# Patient Record
Sex: Female | Born: 1975 | Race: White | Hispanic: No | Marital: Married | Smoking: Current every day smoker
Health system: Southern US, Community
[De-identification: ages and names within clinical notes are randomized; demographics above are authoritative.]

## PROBLEM LIST (undated history)

## (undated) DIAGNOSIS — R569 Unspecified convulsions: Secondary | ICD-10-CM

## (undated) DIAGNOSIS — K219 Gastro-esophageal reflux disease without esophagitis: Secondary | ICD-10-CM

## (undated) DIAGNOSIS — K649 Unspecified hemorrhoids: Secondary | ICD-10-CM

## (undated) DIAGNOSIS — E039 Hypothyroidism, unspecified: Secondary | ICD-10-CM

## (undated) DIAGNOSIS — J45909 Unspecified asthma, uncomplicated: Secondary | ICD-10-CM

## (undated) HISTORY — PX: TUBAL LIGATION: SHX77

## (undated) HISTORY — DX: Gastro-esophageal reflux disease without esophagitis: K21.9

## (undated) HISTORY — DX: Hypothyroidism, unspecified: E03.9

## (undated) HISTORY — DX: Unspecified hemorrhoids: K64.9

---

## 2002-03-12 ENCOUNTER — Ambulatory Visit (HOSPITAL_COMMUNITY): Admission: RE | Admit: 2002-03-12 | Discharge: 2002-03-12 | Payer: Self-pay | Admitting: Obstetrics and Gynecology

## 2002-03-12 ENCOUNTER — Inpatient Hospital Stay (HOSPITAL_COMMUNITY): Admission: RE | Admit: 2002-03-12 | Discharge: 2002-03-17 | Payer: Self-pay | Admitting: Obstetrics and Gynecology

## 2002-03-12 ENCOUNTER — Encounter: Payer: Self-pay | Admitting: Obstetrics and Gynecology

## 2005-10-25 HISTORY — PX: VAGINAL HYSTERECTOMY: SUR661

## 2007-09-22 ENCOUNTER — Ambulatory Visit: Payer: Self-pay | Admitting: Family Medicine

## 2008-01-04 ENCOUNTER — Emergency Department: Payer: Self-pay | Admitting: Emergency Medicine

## 2008-02-12 ENCOUNTER — Ambulatory Visit: Payer: Self-pay | Admitting: Family Medicine

## 2008-03-30 ENCOUNTER — Emergency Department: Payer: Self-pay | Admitting: Emergency Medicine

## 2008-03-30 ENCOUNTER — Other Ambulatory Visit: Payer: Self-pay

## 2008-06-19 ENCOUNTER — Emergency Department: Payer: Self-pay | Admitting: Emergency Medicine

## 2008-08-04 ENCOUNTER — Emergency Department: Payer: Self-pay | Admitting: Emergency Medicine

## 2008-08-04 ENCOUNTER — Other Ambulatory Visit: Payer: Self-pay

## 2008-10-25 HISTORY — PX: CHOLESTEATOMA EXCISION: SHX1345

## 2008-12-23 ENCOUNTER — Ambulatory Visit: Payer: Self-pay | Admitting: Oncology

## 2009-01-22 ENCOUNTER — Ambulatory Visit: Payer: Self-pay | Admitting: Oncology

## 2009-01-23 ENCOUNTER — Ambulatory Visit: Payer: Self-pay | Admitting: Oncology

## 2009-02-22 ENCOUNTER — Ambulatory Visit: Payer: Self-pay | Admitting: Oncology

## 2009-03-04 ENCOUNTER — Emergency Department: Payer: Self-pay | Admitting: Emergency Medicine

## 2009-03-06 ENCOUNTER — Emergency Department: Payer: Self-pay | Admitting: Emergency Medicine

## 2009-03-24 ENCOUNTER — Ambulatory Visit: Payer: Self-pay

## 2009-04-29 ENCOUNTER — Ambulatory Visit: Payer: Self-pay | Admitting: Unknown Physician Specialty

## 2009-05-15 ENCOUNTER — Ambulatory Visit: Payer: Self-pay | Admitting: Unknown Physician Specialty

## 2009-06-16 ENCOUNTER — Ambulatory Visit: Payer: Self-pay | Admitting: Unknown Physician Specialty

## 2009-06-19 ENCOUNTER — Encounter: Admission: RE | Admit: 2009-06-19 | Discharge: 2009-06-19 | Payer: Self-pay | Admitting: Neurosurgery

## 2009-07-01 ENCOUNTER — Encounter: Admission: RE | Admit: 2009-07-01 | Discharge: 2009-07-01 | Payer: Self-pay | Admitting: Neurosurgery

## 2009-11-20 ENCOUNTER — Encounter: Payer: Self-pay | Admitting: Family Medicine

## 2009-11-25 ENCOUNTER — Encounter: Payer: Self-pay | Admitting: Family Medicine

## 2010-03-18 ENCOUNTER — Emergency Department: Payer: Self-pay | Admitting: Emergency Medicine

## 2010-09-15 ENCOUNTER — Ambulatory Visit: Payer: Self-pay | Admitting: Family Medicine

## 2010-10-19 ENCOUNTER — Emergency Department: Payer: Self-pay | Admitting: Unknown Physician Specialty

## 2010-10-23 ENCOUNTER — Encounter: Payer: Self-pay | Admitting: Family Medicine

## 2010-10-25 HISTORY — PX: OTHER SURGICAL HISTORY: SHX169

## 2010-10-25 HISTORY — PX: SPINE SURGERY: SHX786

## 2010-10-27 ENCOUNTER — Ambulatory Visit: Payer: Self-pay | Admitting: Family Medicine

## 2010-10-28 ENCOUNTER — Encounter: Payer: Self-pay | Admitting: Family Medicine

## 2010-11-27 ENCOUNTER — Ambulatory Visit: Payer: Self-pay | Admitting: Family Medicine

## 2010-12-16 ENCOUNTER — Encounter (HOSPITAL_COMMUNITY)
Admission: RE | Admit: 2010-12-16 | Discharge: 2010-12-16 | Disposition: A | Discharge status: Home / Self Care | Payer: Medicaid Other | Source: Ambulatory Visit | Attending: Neurosurgery | Admitting: Neurosurgery

## 2010-12-16 DIAGNOSIS — Z01812 Encounter for preprocedural laboratory examination: Secondary | ICD-10-CM | POA: Insufficient documentation

## 2010-12-16 LAB — URINE MICROSCOPIC-ADD ON

## 2010-12-16 LAB — BASIC METABOLIC PANEL
BUN: 8 mg/dL (ref 6–23)
CO2: 29 mEq/L (ref 19–32)
Chloride: 104 mEq/L (ref 96–112)
Creatinine, Ser: 0.64 mg/dL (ref 0.4–1.2)
Potassium: 4.9 mEq/L (ref 3.5–5.1)

## 2010-12-16 LAB — CBC
HCT: 41 % (ref 36.0–46.0)
Hemoglobin: 14.1 g/dL (ref 12.0–15.0)
MCH: 30.3 pg (ref 26.0–34.0)
MCHC: 34.4 g/dL (ref 30.0–36.0)
MCV: 88 fL (ref 78.0–100.0)
Platelets: 361 10*3/uL (ref 150–400)
RBC: 4.66 MIL/uL (ref 3.87–5.11)
RDW: 12.8 % (ref 11.5–15.5)
WBC: 5.7 10*3/uL (ref 4.0–10.5)

## 2010-12-16 LAB — URINALYSIS, ROUTINE W REFLEX MICROSCOPIC
Ketones, ur: NEGATIVE mg/dL
Nitrite: NEGATIVE
Protein, ur: NEGATIVE mg/dL
Urine Glucose, Fasting: NEGATIVE mg/dL
Urobilinogen, UA: 0.2 mg/dL (ref 0.0–1.0)

## 2010-12-16 LAB — SURGICAL PCR SCREEN
MRSA, PCR: NEGATIVE
Staphylococcus aureus: NEGATIVE

## 2010-12-16 LAB — PROTIME-INR
INR: 1.05 (ref 0.00–1.49)
Prothrombin Time: 13.9 seconds (ref 11.6–15.2)

## 2010-12-16 LAB — APTT: aPTT: 30 seconds (ref 24–37)

## 2010-12-21 ENCOUNTER — Ambulatory Visit (HOSPITAL_COMMUNITY): Payer: Medicaid Other

## 2010-12-21 ENCOUNTER — Observation Stay (HOSPITAL_COMMUNITY)
Admission: RE | Admit: 2010-12-21 | Discharge: 2010-12-21 | Disposition: A | Discharge status: Home / Self Care | Payer: Medicaid Other | Source: Ambulatory Visit | Attending: Neurosurgery | Admitting: Neurosurgery

## 2010-12-21 DIAGNOSIS — M47817 Spondylosis without myelopathy or radiculopathy, lumbosacral region: Secondary | ICD-10-CM | POA: Insufficient documentation

## 2010-12-21 DIAGNOSIS — M5126 Other intervertebral disc displacement, lumbar region: Principal | ICD-10-CM | POA: Insufficient documentation

## 2011-01-01 NOTE — Op Note (Signed)
NAMEKINSLIE, HOVE NO.:  [id number]  MEDICAL RECORD NO.:  [id number]           PATIENT TYPE:  I  LOCATION:  3534                         FACILITY:  [facility]  PHYSICIAN:  Clydene Fake, M.D.  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  PROCEDURE:  Left-sided decompressive laminectomy, decompressing at the L2 and L3 roots (two levels), diskectomy, microdissection with microscope.  SURGEON:  Clydene Fake, MD.  ASSISTANT:  None.  ANESTHESIA:  General endotracheal tube anesthesia.  ESTIMATED BLOOD LOSS:  Minimal.  BLOOD GIVEN:  None.  DRAINS:  None.  COMPLICATIONS:  None.  REASON FOR PROCEDURE:  The patient is a 35 year old woman who has been having back and leg pain with pain that radiating the left hip, upper leg, anterior thigh, with intermittent numbness and tingling.  MRIs done showing some multiple spinal degenerative changes and with broad-based disk bulge L2-3 level with a free fragment and going up cephalad.  All of this causing some canal stenosis with left-sided lateral recess stenosis with all pressure on the 2 and 3 roots on the left side of the L2-3 level.  The patient brought in for decompressive laminectomy.  PROCEDURE IN DETAIL:  The patient was brought to the operating room and general anesthesia was induced.  The patient was placed in a prone position on Wilson frame with all pressure points padded.  The patient was prepped and draped in usual sterile fashion.  Site of incision was injected with 20 mL of 1% lidocaine with epinephrine.  Needle was placed in the interspace.  X-rays were obtained showing the needle was  pointed at the L1-2 and incision was made starting just below where the needle was, incision was taken down to the fascia.  Hemostasis was obtained with Bovie cauterization.  Subperiosteal dissection was done over the 2 and 3 spinous process of the lamina.  Self-retaining retractors were placed and markers were placed at the interspaces, and another x-ray was obtained and this confirmed our positioning at L2-3.  Microscope was brought in for microdissection.  At this point, a high-speed drill was used to do decompressive hemilaminectomy going that most of the lamina of L2 at left side and top of L3, decompressing the central canal. Removed hypertrophic ligaments and then explored the epidural space.  We found the disk space at L2-3 and broad-based disk bulge and then what appeared to be a nerve root exiting a bit low and we increased our laminectomy going a little cephalad.  Above that area and dissect down the epidural space and found up that this tissue was not actually the nerve root.  We found the L2 nerve root exiting and just above there and made sure pressure was all sides and went through this thickened ligament.  We were then retract the dura medially and we found some central annular tear at L2-3 despite some disk material  coming out and then going out subligamentous towards the axilla of the two roots.  We removed this disk herniation.  We explored the two nerve roots and it was decompressed and going out to the foramen and further fragments of disk were found.  There was a small tear but it was within folds of ligaments fairly medially, so we did not open the disk space to the level.  When we were finished, we had good decompression of central canal at 2 and 3 roots.  Hemostasis with Gelfoam thrombin was then irrigated out.  We placed Depo-Medrol in the epidural space.  Retractors removed.  Fascia closed with 0 Vicryl interrupted sutures, subcutaneous tissue closed with a 0,  2-0, and 3-0 Vicryl interrupted sutures, skin closed with benzoin and Steri-Strips.  Dressing was placed.  The patient was placed in the supine position and awakened from anesthesia and transferred to recovery room in stable condition.          ______________________________ Clydene Fake, M.D.     JRH/MEDQ  D:  12/21/2010  T:  12/21/2010  Job:  [id number]  Electronically Signed by Colon Branch M.D. on 01/01/2011 05:05:10 PM

## 2011-01-11 NOTE — Discharge Summary (Signed)
  NAMEORABELLE, RYLEE NO.:  [id number]  MEDICAL RECORD NO.:  [id number]           PATIENT TYPE:  I  LOCATION:  3534                         FACILITY:  [facility]  PHYSICIAN:  Clydene Fake, M.D.  DATE OF BIRTH:  18-Feb-1976  DATE OF ADMISSION:  12/21/2010 DATE OF DISCHARGE:  12/21/2010                              DISCHARGE SUMMARY   ADMISSION DIAGNOSES:  Herniated nucleus pulposus, spondylosis, and stenosis at left L2-3 with radiculopathy.  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  PROCEDURES:  Left decompressive laminectomy decompressing the L2 and L3 roots (2 levels), diskectomy, and microdissection with microscope.  REASON FOR ADMISSION:  This is a 35 year old woman who has been having back and left leg pain radiating to the left hip, upper leg, and anterior thigh.  MRI was done showing free fragments of disk herniation at the left 2-3 level.  The patient is brought in for laminectomy and diskectomy.  HOSPITAL COURSE:  The patient was admitted to Day Surgery and underwent her procedure without complications.  Postop, she was transferred to the recovery room and then into the floor where she was observed for a few hours.  She was up, ambulating, eating, had good pain control, and was discharged home in stable condition.  DISCHARGE MEDICATIONS:  Same as prehospitalization plus pain pills and muscle relaxants use p.r.n.  Followup will be in my office in few weeks.  No strenuous activity. Keep the incision dry for 5 days.          ______________________________ Clydene Fake, M.D.     JRH/MEDQ  D:  01/01/2011  T:  01/02/2011  Job:  [id number]  Electronically Signed by Colon Branch M.D. on 01/11/2011 09:36:26 AM

## 2011-01-26 ENCOUNTER — Ambulatory Visit: Payer: Self-pay | Admitting: Neurosurgery

## 2011-02-01 ENCOUNTER — Encounter (HOSPITAL_COMMUNITY)
Admission: RE | Admit: 2011-02-01 | Discharge: 2011-02-01 | Disposition: A | Discharge status: Home / Self Care | Payer: Medicaid Other | Source: Ambulatory Visit | Attending: Neurosurgery | Admitting: Neurosurgery

## 2011-02-01 LAB — URINALYSIS, ROUTINE W REFLEX MICROSCOPIC
Protein, ur: NEGATIVE mg/dL
Specific Gravity, Urine: 1.04 — ABNORMAL HIGH (ref 1.005–1.030)
Urobilinogen, UA: 0.2 mg/dL (ref 0.0–1.0)

## 2011-02-01 LAB — BASIC METABOLIC PANEL
Calcium: 9.4 mg/dL (ref 8.4–10.5)
Creatinine, Ser: 0.61 mg/dL (ref 0.4–1.2)
GFR calc Af Amer: >60 mL/min (ref >60)
GFR calc non Af Amer: >60 mL/min (ref >60)
Sodium: 137 mEq/L (ref 135–145)

## 2011-02-01 LAB — CBC
HCT: 40.6 % (ref 36.0–46.0)
Hemoglobin: 13.7 g/dL (ref 12.0–15.0)
MCH: 29.5 pg (ref 26.0–34.0)
RBC: 4.64 MIL/uL (ref 3.87–5.11)

## 2011-02-01 LAB — PROTIME-INR
INR: 1.08 (ref 0.00–1.49)
Prothrombin Time: 14.2 seconds (ref 11.6–15.2)

## 2011-02-01 LAB — SURGICAL PCR SCREEN: Staphylococcus aureus: NEGATIVE

## 2011-02-01 LAB — PREGNANCY, URINE: Preg Test, Ur: NEGATIVE

## 2011-02-02 ENCOUNTER — Inpatient Hospital Stay (HOSPITAL_COMMUNITY)
Admission: RE | Admit: 2011-02-02 | Discharge: 2011-02-05 | DRG: 908 | Disposition: A | Discharge status: Home Health | Payer: Medicaid Other | Source: Ambulatory Visit | Attending: Neurosurgery | Admitting: Neurosurgery

## 2011-02-02 DIAGNOSIS — Y836 Removal of other organ (partial) (total) as the cause of abnormal reaction of the patient, or of later complication, without mention of misadventure at the time of the procedure: Secondary | ICD-10-CM | POA: Diagnosis present

## 2011-02-02 DIAGNOSIS — Z01812 Encounter for preprocedural laboratory examination: Secondary | ICD-10-CM

## 2011-02-02 DIAGNOSIS — F319 Bipolar disorder, unspecified: Secondary | ICD-10-CM | POA: Diagnosis present

## 2011-02-02 DIAGNOSIS — F17200 Nicotine dependence, unspecified, uncomplicated: Secondary | ICD-10-CM | POA: Diagnosis present

## 2011-02-02 DIAGNOSIS — G9601 Cranial cerebrospinal fluid leak, spontaneous: Secondary | ICD-10-CM | POA: Diagnosis present

## 2011-02-02 DIAGNOSIS — IMO0002 Reserved for concepts with insufficient information to code with codable children: Principal | ICD-10-CM | POA: Diagnosis present

## 2011-02-02 DIAGNOSIS — K219 Gastro-esophageal reflux disease without esophagitis: Secondary | ICD-10-CM | POA: Diagnosis present

## 2011-02-08 NOTE — Op Note (Signed)
NAMEMERINDA, [name] NO.:  [id number]  MEDICAL RECORD NO.:  [id number]           PATIENT TYPE:  I  LOCATION:  3005                         FACILITY:  [facility]  PHYSICIAN:  Clydene Fake, M.D.  DATE OF BIRTH:  12/19/1975  DATE OF PROCEDURE:  02/02/2011 DATE OF DISCHARGE:                              OPERATIVE REPORT   PREOPERATIVE DIAGNOSIS:  CSF fistula.  POSTOPERATIVE DIAGNOSIS:  CSF fistula.  PROCEDURE:  Explore lumbar lamina wound and repair of CSF fistula, microdissection with microscope.  SURGEON:  Clydene Fake, MD.  ASSISTANT:  Coletta Memos, MD.  ANESTHESIA:  General endotracheal tube anesthesia.  ESTIMATED BLOOD LOSS:  Minimal.  BLOOD GIVEN:  None.  DRAINS:  None.  COMPLICATIONS:  None.  REASON FOR PROCEDURE:  The patient is a 35 year old woman, who underwent a left L2-3 laminectomy and diskectomy about 6 weeks ago.  After 2 weeks __________  postop, did have a fall.  She came into the office for 3- week follow-up visit, did have a punctured fluid collection under the incision and no drainage, we decided to keep an eye on that over the next couple of weeks.  This enlarged, the patient started to have some symptoms of headaches, dizziness, and neck ache.  MRI was done, showing large fluid collection subcutaneously going down through the laminectomy defect into the thecal sac with a presumed CSF fistula.  The patient brought in for exploration of lumbar wound and repair of the CSF fistula.  PROCEDURE IN DETAIL:  The patient was brought into the operating room, and general anesthesia was induced.  The patient was placed in prone position on Wilson frame with all pressure points padded.  The patient was prepped and draped in usual sterile fashion.  Site of incision was injected with 10 mL of 1% lidocaine with epinephrine.  Incision was then made at the site of previous scar in the midline lumbar spine.  As soon as we  opened through the  skin, clear fluid was found in the subcutaneous cavity and CSF leak was entered.  There was a defect in the fascia down to the lamina from the prior surgery and we put a retractor in there.  We exposed the bone edges of the laminectomy and exposed the dura and removing a little bit of scar that was started developing. Obviously, microscope was brought in for microdissection .  At this point, it was obvious the  CSF leak was coming from the inferior aspect. The canal was re-explored that we needed to do little more of laminectomy of the L3 lamina in an effort to  find the dural rent in the medial inferior aspect of the dura, this was under the bone edge prior to doing this laminectomy and we were able to the use a 6-0 Prolene with a figure-of-eight stitch and close this dural rents.  No more spinal fluid seen that coming out.  We did Valsalva and again no further spinal fluid with good hemostasis.  We used DuraSeal to cover the dura with glue.  Retractor was removed and fascia closed with 0 Vicryl interrupted sutures.  Subcutaneous tissue closed with  2-0 and 3-0 Vicryl interrupted sutures skin closed.  Benzoin and Steri-Strips dressing was placed.  The patient was placed back in the supine position, awakened from anesthesia, and transferred to recovery room in stable condition.          ______________________________ Clydene Fake, M.D.     JRH/MEDQ  D:  02/02/2011  T:  02/03/2011  Job:  [id number]  Electronically Signed by Colon Branch M.D. on 02/08/2011 07:02:35 AM

## 2011-02-22 NOTE — Discharge Summary (Signed)
  NAMEJODIANN, OGNIBENE NO.:  [id number]  MEDICAL RECORD NO.:  [id number]           PATIENT TYPE:  I  LOCATION:  3005                         FACILITY:  [facility]  PHYSICIAN:  Clydene Fake, M.D.  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  PROCEDURE:  Explore lumbar laminectomy and repair of CSF fistula.  REASON FOR ADMISSION:  The patient is a 35 year old woman who underwent left L2-3 laminectomy and diskectomy about 6 weeks prior to admission. After 2 weeks postop, she had a fall and came into the office for followup.  She had been developing a slight fluctuance and fluid collection at the incision, but no headaches, and was fairly small. However, within another couple of weeks, this became a large fluid collection.  MRI was done.  The patient was brought in for exploration of lumbar wound and culture of the presumed CSF fistula.  HOSPITAL COURSE:  The patient was admitted on the day of procedure and underwent surgery, which was exploring the laminectomy and repairing the CSF fistula.  Postop, the patient was transferred to recovery room on the floor, and they have kept her at flat bedrest for 48 hours. Incision remained clean, dry, and intact.  She did well.  We started to letting her up and increasing her activity.  She had no postural headache.  Incision was clean, dry, and intact.  On February 05, 2011, the patient was discharged to home in stable condition.  Follow up will be in a couple of weeks in my office.  No strenuous activity.  Medications are same as prior to hospitalization.          ______________________________ Clydene Fake, M.D.     JRH/MEDQ  D:  02/15/2011  T:  02/16/2011   Job:  [id number]  Electronically Signed by Colon Branch M.D. on 02/22/2011 08:22:00 AM

## 2011-03-12 NOTE — Op Note (Signed)
[facility]  Patient:    [name], [name] Visit Number: [id number] MRN: [id number]          Service Type: OUT Location: RAD Attending Physician:  Tilda Burrow Dictated by:   Christin Bach, M.D. Admit Date:  03/12/2002 Discharge Date: 03/12/2002   CC:         Dr. Selinda Flavin, [place], [place]   Operative Report  PREOPERATIVE DIAGNOSIS:  Pregnancy [redacted] weeks gestation, cephalopelvic disproportion, desires elective permanent sterilization.  POSTOPERATIVE DIAGNOSIS:  Pregnancy [redacted] weeks gestation, cephalopelvic disproportion, desires elective permanent sterilization.  Also intraoperative hemorrhage secondary to left uterine vein laceration.  OPERATION/PROCEDURE:  1. Primary low transverse cervical cesarean section. 2. Bilateral partial salpingectomy.  SURGEON:  Christin Bach, M.D.  ASSISTANTAmie Critchley CST  ANESTHESIA:  Epidural - Dr. Despina Hidden managed by Farris Has, CRNA  COMPLICATIONS:  Extension of lower uterine segment incision into left uterine vessels with anatomic difficulties in identification due to patient girth resulting in markedly increased blood loss.  DETAILS OF PROCEDURE:  The patient was taken to the operating room and prepped and draped in the usual fashion for lower abdominal surgery.  A Pfannenstiel-type incision was performed just above the panniculus crease, being careful to avoid the superficial epigastric vessels which were visualized, but pulled laterally.  A Pfannenstiel-type incision was performed and blunt entry of the abdominal cavity performed above the bladder which was quite high.  Bladder flap was developed on the lower uterine segment, and a transverse nick made in the uterus and carefully performed to identify a small amount of amniotic fluid.  The incision was extended laterally using index finger traction and the fetal vertex could be identified.  The incision was at the level of the eyebrows of the infant which  was in a direct occiput posterior presentation.  The fetal vertex was rotated clockwise into the incision and delivered by fundal pressure.  The infant was a large infant.  The infant showed some decreased tone and responded somewhat to tactile stimulation. Approximately 50 cm of cord was sent with the infant to Dr. Latrelle Dodrill for doing her care.  There was a true knot x1 in the cord.  See Dr. Nunzio Cory notes for further details on the infant.  The cord blood samples were obtained and the placenta delivered easily in response to uterine contraction and the uterine size.  There was a generous amount of bleeding from the incision from several areas.  There was technically difficulty in identifying the anterior aspect of the transverse incision due to the voluminous fatty tissues, but we eventually were able to get [place] on the midportion of the upper and lower uterine incision edges.  The patients right side of the incision was closed with relative ease incorporating a couple of small arterial bleeders at the right edge, but not involving the uterine artery, itself on that side.  The left side was technically more challenging.  We were able to place the hand in such a way, as to place the surgeons right index finger behind the uterine vessels and to elevate, in such a way, that we could start at a corner and sew back toward the midline.  This controlled a couple of spots of arterial pulsatile bleeding.  After this layer was closed we began to close the second-layer of the two-layer closure; but we were interrupted by a recurrence of heavy bleeding from the left corner whereupon we were able to identify a large 1-cm, transverse diameter, uterine vein that was  also lacerated on the left.  This required separate double ligature in order to achieve adequate hemostasis.  Inspection, at this point, revealed adequate hemostasis.  The second layer of closure was completed.  Two additional  figure-of-eight sutures were required on the left side to complete adequate hemostasis.  At this time, we placed a lap in the bladder flap area and proceeded with two ligation.  Tubal ligation was performed which consisted of ligating around the midsegment knuckle of each fallopian tube which had been identified to its fimbriated end with excision of the specimen and identification of each tube portion for histology.  Hemostasis of the tube was good.  The bladder flap was reinspected and it was found relatively hemostatic.  The bladder flap was loosely reapproximated with 2-0 chromic.  The anterior peritoneal cavity was closed using a running 2-0 chromic.  The muscles reapproximated using 2 interrupted sutures of 2-0 chromic.  Then the fascia was closed with continuous running #0 Vicryl.  The subcutaneous tissues were irrigated, loosely reapproximated with 2 sutures of 2-0 plain and staple closure of the skin completed the procedure.  The patient tolerated the procedure well and went to the recovery room in good condition.  ADDENDUM:  Laparotomy.  Twenty-two laparotomy sponges were weighed at 2077 g minus 25 g per sponge for a total measured blood loss of 2400 cc. Epidural catheter was removed by Dr. Caroll Rancher with tip visualized as intact. Dictated by:   Christin Bach, M.D. Attending Physician:  Tilda Burrow DD:  03/13/02 TD:  03/15/02 Job: [id number]

## 2011-03-12 NOTE — Discharge Summary (Signed)
[facility]  Patient:    [name], [name] Visit Number: [id number] MRN: [id number]          Service Type: OBS Location: 4A A417 01 Attending Physician:  Tilda Burrow Dictated by:   Christin Bach, M.D. Admit Date:  03/12/2002 Discharge Date: 03/17/2002                             Discharge Summary  ADMITTING DIAGNOSES: 1. Pregnancy [redacted] weeks gestation. 2. Maternal fatigue and maternal request. 3. Suspect large for gestational age infant. 4. Maternal obesity. 5. Desire for elective sterilization.  DISCHARGE DIAGNOSES: 1. Pregnancy 40 weeks delivered. 2. Cephalopelvic disproportion secondary to large infant size. 3. Occiput posterior presentation. 4. Postoperative anemia secondary to blood loss. 5. Desire for elective sterilization.  PROCEDURE:  Mar 13, 2002 1. Lumbar epidural for labor management - Duane Lope, M.D. 2. Primary low transverse cervical cesarean section by Christin Bach, M.D. 3. Bilateral partial salpingectomy by Christin Bach, M.D  DISCHARGE MEDICATIONS: 1. Chromagen OB 1 p.o. b.i.d. x30 days. 2. Prenatal vitamins 1 p.o. every day. 3. Tylox 1 q.4h. p.r.n. pain, dispense 20. 4. HCTZ 25 mg 1 p.o. every day x2 weeks.  DISCHARGE INSTRUCTIONS:  Follow up in 4 days in our office.  HISTORY OF PRESENT ILLNESS:  This is a 36 year old gravida, 2, para 1 with a prior difficult delivery of a 7 pound 14 ounce infant requiring a 30+ hour labor.  She was admitted after complaining of multiple obstetric discomforts and desire for induction at term.  An additional consideration was at ultrasound has estimated fetal weight at greater than 9 pounds, 4353 g estimated weight when performed on Mar 12, 2002.  The patient was admitted to optimize her chance at vaginal delivery.  PAST MEDICAL HISTORY:  Hypothyroidism.  PAST SURGICAL HISTORY:  Negative.  ALLERGIES:  None.  OBSTETRICAL HISTORY:  Prolonged labor, as mentioned above with first  infant.  HOSPITAL COURSE:  The patient was admitted and underwent Foley bulb ripening of the cervix and then proceeded with Pitocin induction of labor on the morning of May 20.  She was 3 cm long, minus 3 station at 8 a.m. with presenting parts at the pelvic inlets with contractions optimized during the day, at 1:30 p.m. she was 4-5 cm.  She progressed to 9 cm, dilated completely, effaced minus 1.  Epidural was stopped to allow maximum pushing effort. Epidural has been placed by Dr. Despina Hidden.  Once she reached completely dilated she was able to push, but there was absolutely no descent of the vertex.  She developed nonreassuring variable decelerations associated with pushing effort.  C-section was recommended at 1930.  The patient was taken to the operating room where cesarean section was performed under epidural analgesia, with surgery beginning at 2015.  Surgery was complicated by extension of the transverse uterine incision into the uterine vessels on the left side with extreme difficulty controlling the blood loss due to both arterial and venous bleeding from this area.  Ultimately we gained control of this, but the prolonged labor resulted in very edematous tissues and the inferior aspect of the extension of the incision dived caudad, and, therefore, made it difficult for [place] to gain adequate exposure and control.  Postoperative hemoglobin showed the expected drop after an estimated blood loss of 2400 cc based on measured laparotomy sponges and measured suctioning. Hemoglobin on the evening of surgery was 8.8 and 26.0, just before midnight; and  gradually eased downward to 7.4 and 21.8 on the day of discharge, due to equilibration.  She tolerated the anemia quite well with mild lightheadedness upon initially standing up or sitting, but not enough to warrant transfusion. The patient was discharged for follow up in 4 days for staple removal and 4 weeks for postpartum check. Dictated by:    Christin Bach, M.D. Attending Physician:  Tilda Burrow DD:  03/17/02 TD:  03/20/02 Job: [id number]

## 2011-03-12 NOTE — Op Note (Signed)
[facility]  Patient:    [name], [name] Visit Number: [id number] MRN: [id number]          Service Type: OUT Location: RAD Attending Physician:  Tilda Burrow Dictated by:   Duane Lope, M.D. Proc. Date: 03/13/02 Admit Date:  03/12/2002 Discharge Date: 03/12/2002                             Operative Report  PROCEDURE:  Epidural placement.  INDICATIONS:  [name] is [redacted] weeks gestation with borderline fetal macrosomia, induced for that indication.  She is approximately 4 cm dilated and about 50% effaced.  She has already received one dose of Nubain.  The patient continues to have regular contractions and is requesting an epidural.  Complications of the epidural are explained to the patient and she understands.  DESCRIPTION OF PROCEDURE:  The patient is placed in the sitting position.  I used an OR indelible marker to mark her paraspinous processes, palpate her iliac crest, and find the L3-4 and L2-3 interspaces.  I decided to put the epidural at the L2-3 interspace and mark it.  The area was prepped and draped in the usual fashion.  One percent lidocaine was injected as a skin wheal.  A 17 gauge Tuohy needle was then used and a loss of resistance technique was used.  I was just to the left of the midline according to patient interaction and took three passes to find the epidural space.  I got loss of resistance at 8.5 cm and threaded the catheter without difficulty.  Sensorcaine plan 0.125% 10 cc was injected into the epidural space and the patient had no adverse symptoms.  I waited five minutes and injected another 10 cc through the catheter and taped it down at this point.  The patient got symptomatic relief at approximately 20 minutes.  I gave her another 5 cc bolus of the continuous infusion of 0.125% plain with 2 mcg/cc of fentanyl and placed that running continuously at 12 cc/hr.  The patient had a level of approximately T8 and had some  blood pressure issues and was ephedrine and ongoing fluid volume.  The patient tolerated it well. Dictated by:   Duane Lope, M.D. Attending Physician:  Tilda Burrow DD:  03/15/02 TD:  03/15/02 Job: [id number]

## 2011-04-26 ENCOUNTER — Emergency Department: Payer: Self-pay | Admitting: Emergency Medicine

## 2011-05-13 ENCOUNTER — Ambulatory Visit: Payer: Self-pay | Admitting: Family Medicine

## 2011-06-23 ENCOUNTER — Ambulatory Visit: Payer: Self-pay | Admitting: Family Medicine

## 2011-07-01 ENCOUNTER — Ambulatory Visit: Payer: Self-pay | Admitting: Gynecologic Oncology

## 2011-07-20 ENCOUNTER — Ambulatory Visit: Payer: Self-pay | Admitting: Gynecologic Oncology

## 2011-07-26 ENCOUNTER — Ambulatory Visit: Payer: Self-pay | Admitting: Gynecologic Oncology

## 2011-07-27 ENCOUNTER — Ambulatory Visit: Payer: Self-pay | Admitting: Gynecologic Oncology

## 2011-08-23 ENCOUNTER — Ambulatory Visit: Payer: Self-pay | Admitting: Gynecologic Oncology

## 2011-08-26 ENCOUNTER — Ambulatory Visit: Payer: Self-pay | Admitting: Gynecologic Oncology

## 2011-08-31 ENCOUNTER — Ambulatory Visit: Payer: Self-pay | Admitting: Gynecologic Oncology

## 2011-09-02 LAB — PATHOLOGY REPORT

## 2011-09-25 ENCOUNTER — Ambulatory Visit: Payer: Self-pay | Admitting: Gynecologic Oncology

## 2011-11-23 ENCOUNTER — Ambulatory Visit: Payer: Self-pay | Admitting: Oncology

## 2011-11-26 ENCOUNTER — Ambulatory Visit: Payer: Self-pay | Admitting: Oncology

## 2012-01-25 ENCOUNTER — Emergency Department: Payer: Self-pay | Admitting: Unknown Physician Specialty

## 2012-01-25 LAB — COMPREHENSIVE METABOLIC PANEL
Albumin: 4.1 g/dL (ref 3.4–5.0)
Alkaline Phosphatase: 74 U/L (ref 50–136)
Anion Gap: 12 (ref 7–16)
BUN: 11 mg/dL (ref 7–18)
Calcium, Total: 9.4 mg/dL (ref 8.5–10.1)
Co2: 24 mmol/L (ref 21–32)
Creatinine: 0.69 mg/dL (ref 0.60–1.30)
EGFR (Non-African Amer.): >60
Potassium: 3.6 mmol/L (ref 3.5–5.1)
SGPT (ALT): 14 U/L
Total Protein: 8.3 g/dL — ABNORMAL HIGH (ref 6.4–8.2)

## 2012-01-25 LAB — CBC
HCT: 41.3 % (ref 35.0–47.0)
HGB: 14.1 g/dL (ref 12.0–16.0)
MCH: 31.1 pg (ref 26.0–34.0)
MCHC: 34 g/dL (ref 32.0–36.0)
MCV: 92 fL (ref 80–100)
Platelet: 335 10*3/uL (ref 150–440)
RBC: 4.52 10*6/uL (ref 3.80–5.20)

## 2012-01-25 LAB — URINALYSIS, COMPLETE
Bilirubin,UR: NEGATIVE
Glucose,UR: NEGATIVE mg/dL (ref 0–75)
Ph: 5 (ref 4.5–8.0)
Protein: 100
RBC,UR: 1502 /HPF (ref 0–5)
Specific Gravity: 1.03 (ref 1.003–1.030)
Squamous Epithelial: 5

## 2012-01-25 LAB — LIPASE, BLOOD: Lipase: 66 U/L — ABNORMAL LOW (ref 73–393)

## 2012-02-15 ENCOUNTER — Ambulatory Visit: Payer: Self-pay

## 2012-03-07 ENCOUNTER — Ambulatory Visit: Payer: Self-pay | Admitting: Oncology

## 2012-03-25 ENCOUNTER — Ambulatory Visit: Payer: Self-pay | Admitting: Oncology

## 2012-04-06 IMAGING — US US PELV - US TRANSVAGINAL
1 series · 17 of 25 positions shown · non-contrast
Comparison: none

REASON FOR EXAM: ovarian cysts
COMMENTS:

[Series 1: us pelv - us transvaginal · 17 of 113 slices shown]
[im 1/113]
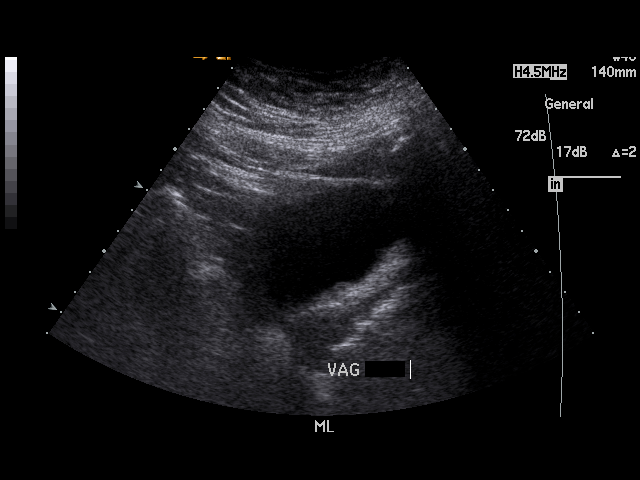
[im 10/113]
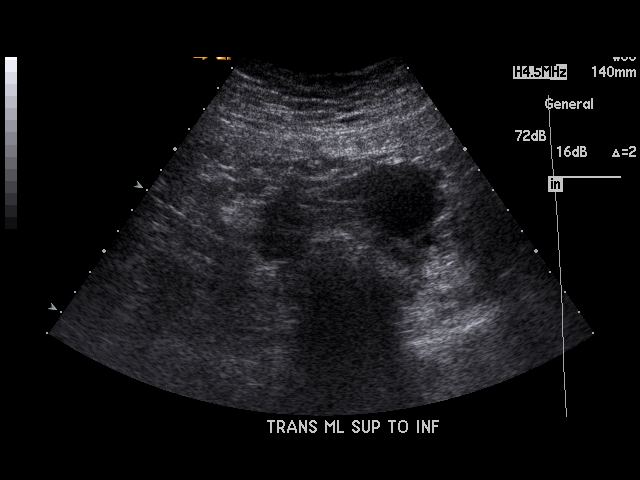
[im 15/113]
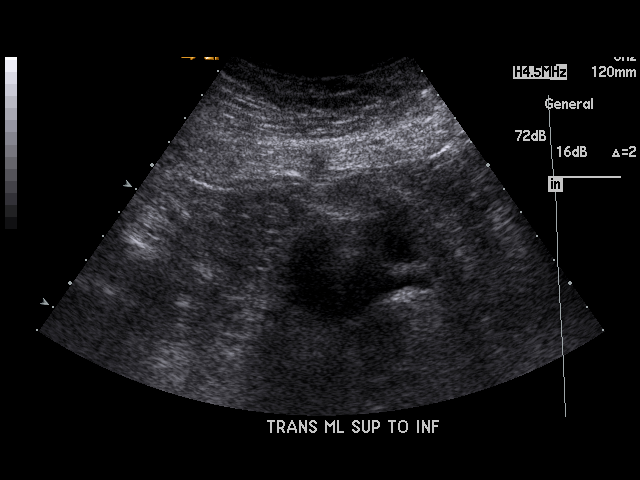
[im 24/113]
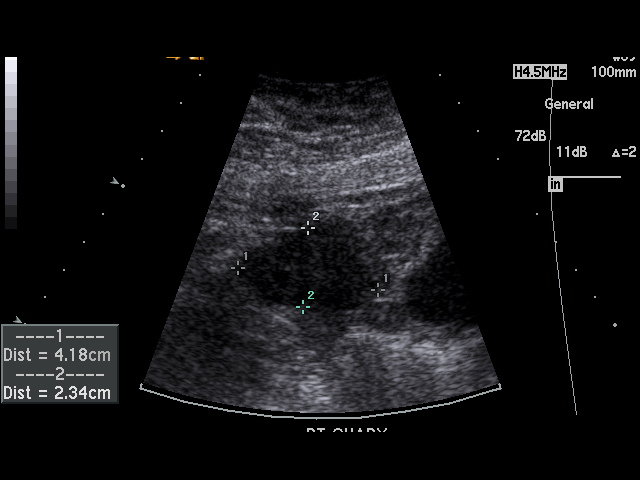
[im 29/113]
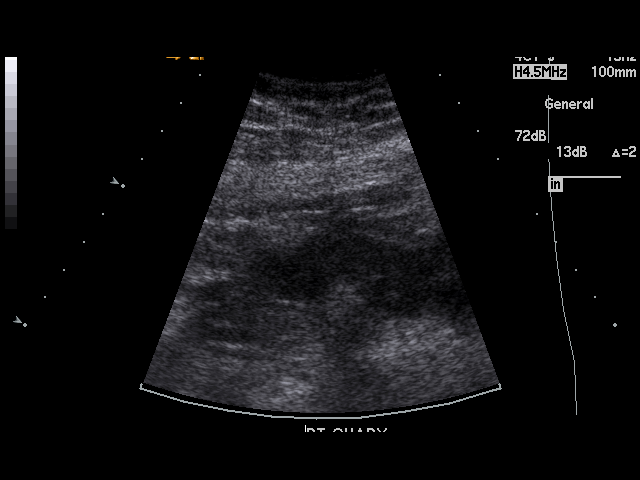
[im 38/113]
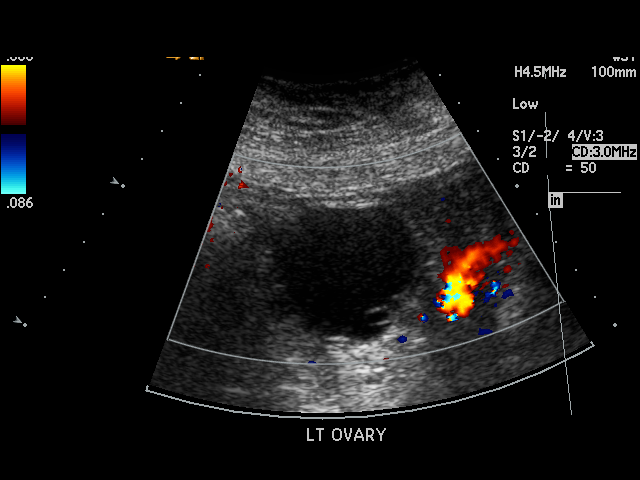
[im 43/113]
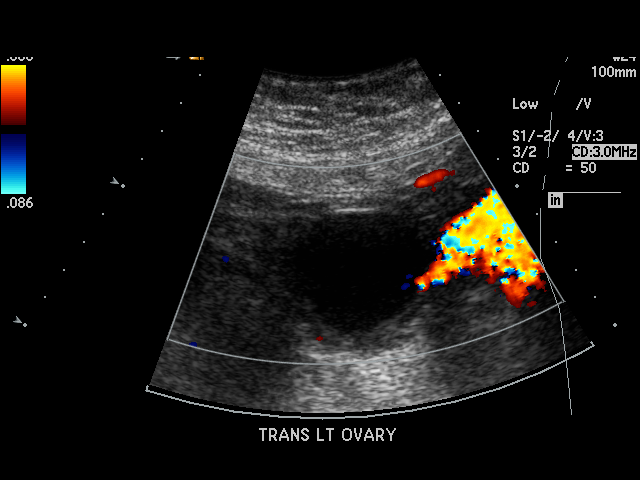
[im 52/113]
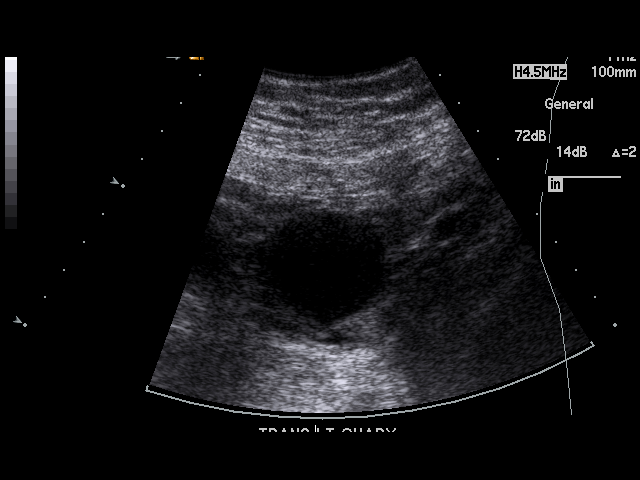
[im 57/113]
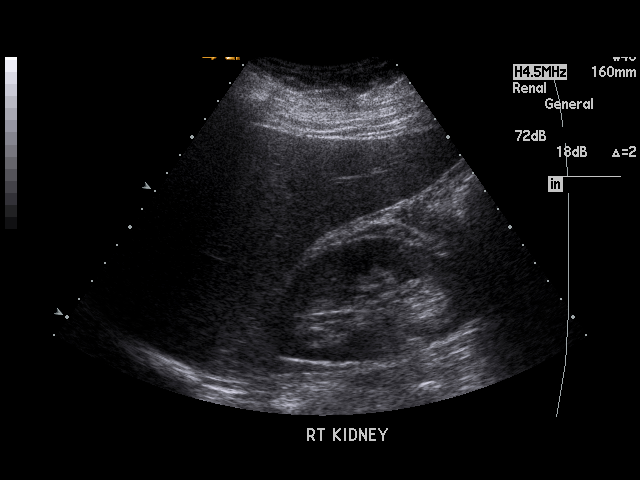
[im 61/113]
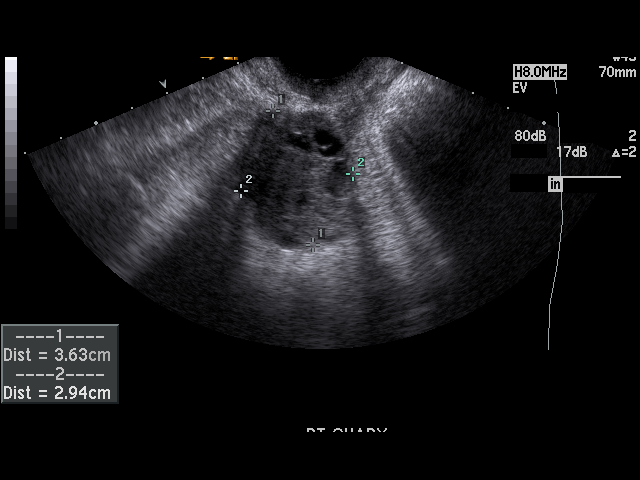
[im 71/113]
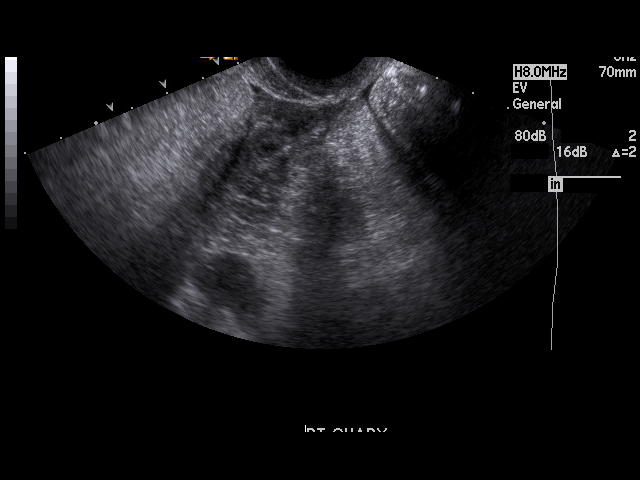
[im 75/113]
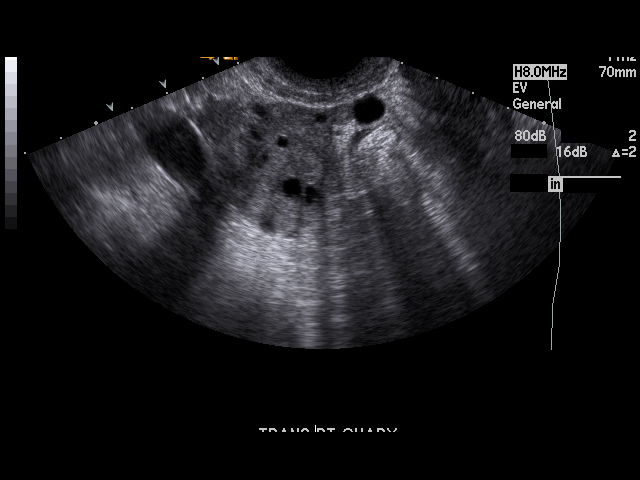
[im 85/113]
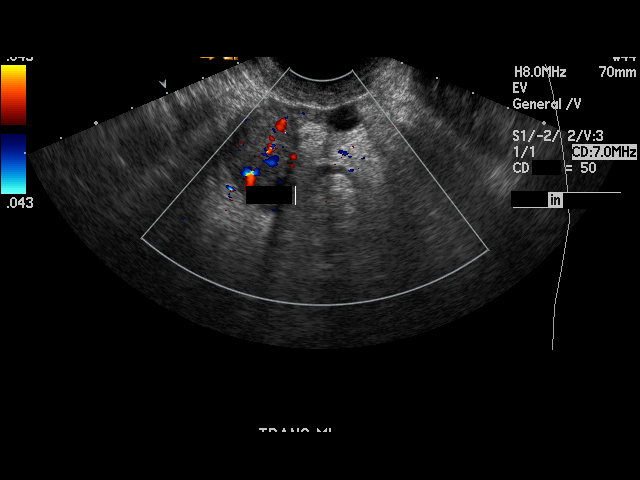
[im 89/113]
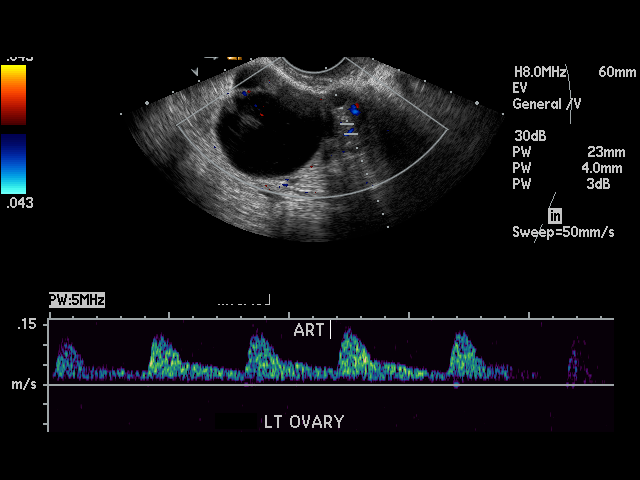
[im 99/113]
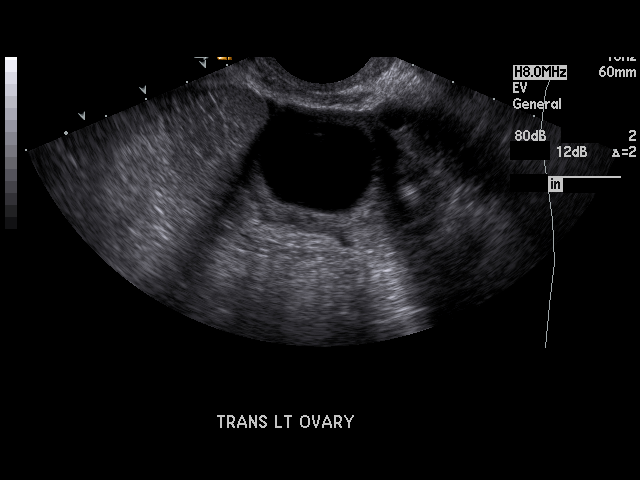
[im 103/113]
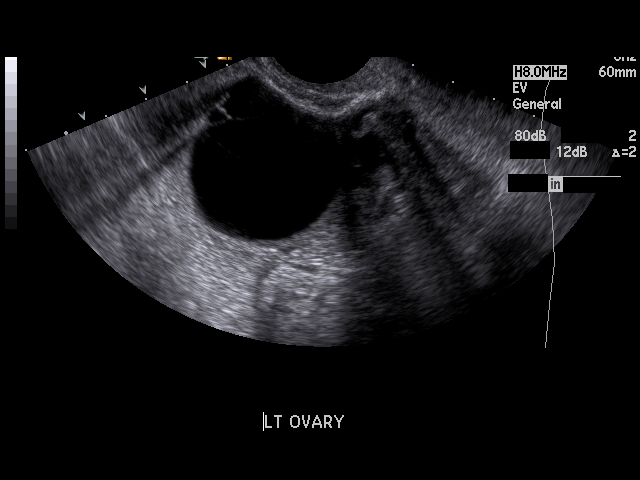
[im 113/113]
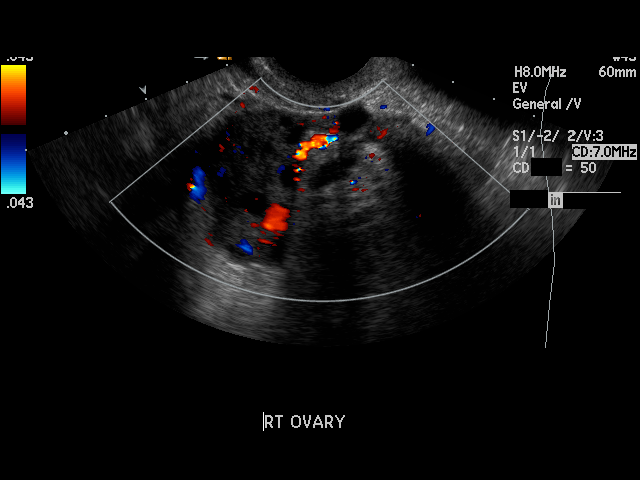

[17 of 25 positions shown; findings below may reference images not displayed]

PROCEDURE:     US  - US PELVIS EXAM W/TRANSVAGINAL  - July 27, 2011 [DATE]

RESULT:     Transabdominal and endovaginal ultrasound was performed. The
patient is status post hysterectomy. The right and left ovaries are
visualized. The right ovary measures 3.63 cm at maximum diameter and the
left ovary measures 5.27 cm at maximum diameter. There is a 4.12 cm septated
cyst in the left ovary. There is an adjacent smaller cyst measuring
approximately 1.18 cm in diameter and which has decreased in size since the
prior exam. The right ovary is normal in appearance. No ascites is seen. No
additional adnexal masses are noted. The kidneys show no hydronephrosis.
IMPRESSION: 1. There is a 4.12 cm septated cyst of the left ovary. An adjacent 1.18 cm
cyst is observed.
2. The right ovary is normal in appearance.
3. No free fluid is seen in the pelvis.
4. The patient is status post hysterectomy.

## 2012-06-08 ENCOUNTER — Ambulatory Visit: Payer: Self-pay | Admitting: Unknown Physician Specialty

## 2012-11-16 ENCOUNTER — Ambulatory Visit: Payer: Self-pay | Admitting: Pain Medicine

## 2012-11-27 ENCOUNTER — Ambulatory Visit: Payer: Self-pay | Admitting: Pain Medicine

## 2012-11-27 LAB — PROTIME-INR: Prothrombin Time: 13.7 secs (ref 11.5–14.7)

## 2012-12-16 ENCOUNTER — Emergency Department: Payer: Self-pay | Admitting: Emergency Medicine

## 2012-12-18 ENCOUNTER — Inpatient Hospital Stay: Payer: Self-pay | Admitting: Internal Medicine

## 2012-12-18 LAB — COMPREHENSIVE METABOLIC PANEL
Albumin: 3.3 g/dL — ABNORMAL LOW (ref 3.4–5.0)
Alkaline Phosphatase: 69 U/L (ref 50–136)
Calcium, Total: 8.6 mg/dL (ref 8.5–10.1)
Chloride: 110 mmol/L — ABNORMAL HIGH (ref 98–107)
Creatinine: 0.73 mg/dL (ref 0.60–1.30)
EGFR (African American): >60
EGFR (Non-African Amer.): >60
Osmolality: 277 (ref 275–301)
Potassium: 3.9 mmol/L (ref 3.5–5.1)
SGPT (ALT): 26 U/L (ref 12–78)
Sodium: 139 mmol/L (ref 136–145)
Total Protein: 7.3 g/dL (ref 6.4–8.2)

## 2012-12-18 LAB — CBC
HCT: 43.4 % (ref 35.0–47.0)
HGB: 14.4 g/dL (ref 12.0–16.0)
MCH: 30.4 pg (ref 26.0–34.0)
MCV: 91 fL (ref 80–100)
Platelet: 258 10*3/uL (ref 150–440)
RBC: 4.76 10*6/uL (ref 3.80–5.20)

## 2012-12-18 LAB — URINALYSIS, COMPLETE
Glucose,UR: NEGATIVE mg/dL (ref 0–75)
Hyaline Cast: 3
Ketone: NEGATIVE
Nitrite: NEGATIVE
Ph: 5 (ref 4.5–8.0)
Protein: NEGATIVE
Specific Gravity: 1.024 (ref 1.003–1.030)
Squamous Epithelial: 19

## 2012-12-18 LAB — DRUG SCREEN, URINE
Amphetamines, Ur Screen: NEGATIVE (ref ?–1000)
Barbiturates, Ur Screen: NEGATIVE (ref ?–200)
Benzodiazepine, Ur Scrn: POSITIVE (ref ?–200)
Cannabinoid 50 Ng, Ur ~~LOC~~: NEGATIVE (ref ?–50)
Cocaine Metabolite,Ur ~~LOC~~: NEGATIVE (ref ?–300)
MDMA (Ecstasy)Ur Screen: NEGATIVE (ref ?–500)
Opiate, Ur Screen: POSITIVE (ref ?–300)
Phencyclidine (PCP) Ur S: NEGATIVE (ref ?–25)

## 2012-12-18 LAB — MAGNESIUM: Magnesium: 1.6 mg/dL — ABNORMAL LOW

## 2012-12-18 LAB — LIPASE, BLOOD: Lipase: 77 U/L (ref 73–393)

## 2012-12-19 LAB — PHENYTOIN LEVEL, TOTAL: Dilantin: 11.1 ug/mL (ref 10.0–20.0)

## 2012-12-19 LAB — MAGNESIUM: Magnesium: 2.1 mg/dL

## 2012-12-20 LAB — CBC WITH DIFFERENTIAL/PLATELET
Basophil #: 0.1 10*3/uL (ref 0.0–0.1)
Basophil %: 1.1 %
Eosinophil #: 0.1 10*3/uL (ref 0.0–0.7)
HGB: 12.6 g/dL (ref 12.0–16.0)
Lymphocyte #: 1.7 10*3/uL (ref 1.0–3.6)
Lymphocyte %: 38 %
MCH: 30.8 pg (ref 26.0–34.0)
RBC: 4.08 10*6/uL (ref 3.80–5.20)
RDW: 13.4 % (ref 11.5–14.5)

## 2012-12-20 LAB — BASIC METABOLIC PANEL
BUN: 14 mg/dL (ref 7–18)
Chloride: 109 mmol/L — ABNORMAL HIGH (ref 98–107)
Creatinine: 0.72 mg/dL (ref 0.60–1.30)
EGFR (African American): >60
EGFR (Non-African Amer.): >60
Potassium: 4 mmol/L (ref 3.5–5.1)
Sodium: 140 mmol/L (ref 136–145)

## 2013-01-18 ENCOUNTER — Ambulatory Visit: Payer: Self-pay | Admitting: Pain Medicine

## 2013-01-31 ENCOUNTER — Ambulatory Visit: Payer: Self-pay | Admitting: Pain Medicine

## 2013-03-01 ENCOUNTER — Ambulatory Visit: Payer: Self-pay | Admitting: Pain Medicine

## 2013-04-09 ENCOUNTER — Ambulatory Visit: Payer: Self-pay | Admitting: Pain Medicine

## 2013-05-08 ENCOUNTER — Ambulatory Visit: Payer: Self-pay | Admitting: Pain Medicine

## 2013-05-11 ENCOUNTER — Other Ambulatory Visit: Payer: Self-pay | Admitting: Pain Medicine

## 2013-05-11 LAB — APTT: Activated PTT: 34.7 secs (ref 23.6–35.9)

## 2013-05-11 LAB — PROTIME-INR
INR: 1
Prothrombin Time: 13.4 secs (ref 11.5–14.7)

## 2013-05-11 LAB — PLATELET FUNCTION ASSAY: COL/EPI PLT FXN SCRN: 94 Seconds

## 2013-05-14 ENCOUNTER — Ambulatory Visit: Payer: Self-pay | Admitting: Pain Medicine

## 2013-06-12 ENCOUNTER — Ambulatory Visit: Payer: Self-pay | Admitting: Pain Medicine

## 2013-07-02 ENCOUNTER — Ambulatory Visit: Payer: Self-pay | Admitting: Pain Medicine

## 2013-07-13 DIAGNOSIS — R102 Pelvic and perineal pain unspecified side: Secondary | ICD-10-CM | POA: Insufficient documentation

## 2013-07-13 DIAGNOSIS — R350 Frequency of micturition: Secondary | ICD-10-CM | POA: Insufficient documentation

## 2013-07-13 DIAGNOSIS — N3941 Urge incontinence: Secondary | ICD-10-CM | POA: Insufficient documentation

## 2013-07-13 DIAGNOSIS — N393 Stress incontinence (female) (male): Secondary | ICD-10-CM | POA: Insufficient documentation

## 2013-07-13 DIAGNOSIS — R339 Retention of urine, unspecified: Secondary | ICD-10-CM | POA: Insufficient documentation

## 2013-07-31 ENCOUNTER — Ambulatory Visit: Payer: Self-pay | Admitting: Pain Medicine

## 2013-08-08 ENCOUNTER — Ambulatory Visit: Payer: Self-pay | Admitting: Pain Medicine

## 2013-09-06 ENCOUNTER — Ambulatory Visit: Payer: Self-pay | Admitting: Pain Medicine

## 2013-09-12 ENCOUNTER — Ambulatory Visit: Payer: Self-pay | Admitting: Pain Medicine

## 2013-10-03 ENCOUNTER — Ambulatory Visit: Payer: Self-pay | Admitting: Pain Medicine

## 2013-10-17 LAB — COMPREHENSIVE METABOLIC PANEL
Anion Gap: 4 — ABNORMAL LOW (ref 7–16)
Calcium, Total: 8.7 mg/dL (ref 8.5–10.1)
Creatinine: 0.64 mg/dL (ref 0.60–1.30)
EGFR (African American): >60
Glucose: 97 mg/dL (ref 65–99)
Osmolality: 274 (ref 275–301)
SGOT(AST): 21 U/L (ref 15–37)
Sodium: 138 mmol/L (ref 136–145)
Total Protein: 6.9 g/dL (ref 6.4–8.2)

## 2013-10-17 LAB — DRUG SCREEN, URINE
Amphetamines, Ur Screen: POSITIVE (ref ?–1000)
Barbiturates, Ur Screen: NEGATIVE (ref ?–200)
Benzodiazepine, Ur Scrn: POSITIVE (ref ?–200)
Cannabinoid 50 Ng, Ur ~~LOC~~: NEGATIVE (ref ?–50)
Cocaine Metabolite,Ur ~~LOC~~: NEGATIVE (ref ?–300)
MDMA (Ecstasy)Ur Screen: NEGATIVE (ref ?–500)
Methadone, Ur Screen: NEGATIVE (ref ?–300)
Opiate, Ur Screen: NEGATIVE (ref ?–300)
Tricyclic, Ur Screen: NEGATIVE (ref ?–1000)

## 2013-10-17 LAB — CBC
HCT: 40.8 % (ref 35.0–47.0)
MCH: 29.6 pg (ref 26.0–34.0)
MCHC: 33.9 g/dL (ref 32.0–36.0)
MCV: 87 fL (ref 80–100)
Platelet: 309 10*3/uL (ref 150–440)
RBC: 4.68 10*6/uL (ref 3.80–5.20)
RDW: 13 % (ref 11.5–14.5)

## 2013-10-17 LAB — URINALYSIS, COMPLETE
Ketone: NEGATIVE
Nitrite: NEGATIVE
Ph: 6 (ref 4.5–8.0)
Protein: NEGATIVE
RBC,UR: 9 /HPF (ref 0–5)
Specific Gravity: 1.018 (ref 1.003–1.030)
Squamous Epithelial: 44
WBC UR: 5 /HPF (ref 0–5)

## 2013-10-17 LAB — T4, FREE: Free Thyroxine: 2.85 ng/dL — ABNORMAL HIGH (ref 0.76–1.46)

## 2013-10-18 ENCOUNTER — Inpatient Hospital Stay: Payer: Self-pay | Admitting: Psychiatry

## 2013-10-18 LAB — PREGNANCY, URINE: Pregnancy Test, Urine: NEGATIVE m[IU]/mL

## 2013-10-29 ENCOUNTER — Ambulatory Visit: Payer: Self-pay | Admitting: Pain Medicine

## 2013-11-01 ENCOUNTER — Ambulatory Visit: Payer: Self-pay | Admitting: Pain Medicine

## 2013-12-06 ENCOUNTER — Ambulatory Visit: Payer: Self-pay | Admitting: Pain Medicine

## 2013-12-17 ENCOUNTER — Ambulatory Visit: Payer: Self-pay | Admitting: Pain Medicine

## 2014-01-03 ENCOUNTER — Ambulatory Visit: Payer: Self-pay | Admitting: Pain Medicine

## 2014-01-09 ENCOUNTER — Ambulatory Visit: Admit: 2014-01-09 | Disposition: A | Discharge status: Home / Self Care | Payer: Self-pay | Admitting: Pain Medicine

## 2014-01-16 ENCOUNTER — Ambulatory Visit: Payer: Self-pay | Admitting: Pain Medicine

## 2014-02-05 ENCOUNTER — Ambulatory Visit: Payer: Self-pay | Admitting: Pain Medicine

## 2014-02-13 ENCOUNTER — Ambulatory Visit: Payer: Self-pay | Admitting: Pain Medicine

## 2014-03-07 ENCOUNTER — Ambulatory Visit: Payer: Self-pay | Admitting: Pain Medicine

## 2014-03-27 ENCOUNTER — Ambulatory Visit: Payer: Self-pay | Admitting: Pain Medicine

## 2014-05-02 ENCOUNTER — Ambulatory Visit: Payer: Self-pay | Admitting: Pain Medicine

## 2014-06-04 ENCOUNTER — Ambulatory Visit: Payer: Self-pay | Admitting: Pain Medicine

## 2014-07-04 ENCOUNTER — Ambulatory Visit: Payer: Self-pay | Admitting: Pain Medicine

## 2014-07-08 ENCOUNTER — Ambulatory Visit: Payer: Self-pay | Admitting: Pain Medicine

## 2014-08-01 ENCOUNTER — Ambulatory Visit: Payer: Self-pay | Admitting: Pain Medicine

## 2014-08-21 ENCOUNTER — Ambulatory Visit: Payer: Self-pay | Admitting: Pain Medicine

## 2014-09-03 ENCOUNTER — Ambulatory Visit: Payer: Self-pay | Admitting: Pain Medicine

## 2014-09-09 ENCOUNTER — Ambulatory Visit: Payer: Self-pay | Admitting: Pain Medicine

## 2014-10-01 ENCOUNTER — Ambulatory Visit: Payer: Self-pay | Admitting: Pain Medicine

## 2014-10-09 ENCOUNTER — Ambulatory Visit: Payer: Self-pay | Admitting: Pain Medicine

## 2014-10-31 ENCOUNTER — Ambulatory Visit: Payer: Self-pay | Admitting: Pain Medicine

## 2014-12-05 ENCOUNTER — Ambulatory Visit: Payer: Self-pay | Admitting: Pain Medicine

## 2014-12-29 ENCOUNTER — Encounter (HOSPITAL_COMMUNITY): Payer: Self-pay | Admitting: *Deleted

## 2014-12-29 ENCOUNTER — Emergency Department (HOSPITAL_COMMUNITY)
Admission: EM | Admit: 2014-12-29 | Discharge: 2014-12-29 | Discharge status: Against Medical Advice | Payer: Medicaid Other | Attending: Emergency Medicine | Admitting: Emergency Medicine

## 2014-12-29 DIAGNOSIS — Z3202 Encounter for pregnancy test, result negative: Secondary | ICD-10-CM | POA: Insufficient documentation

## 2014-12-29 DIAGNOSIS — R000 Tachycardia, unspecified: Secondary | ICD-10-CM | POA: Diagnosis not present

## 2014-12-29 DIAGNOSIS — R109 Unspecified abdominal pain: Secondary | ICD-10-CM

## 2014-12-29 DIAGNOSIS — Z720 Tobacco use: Secondary | ICD-10-CM | POA: Diagnosis not present

## 2014-12-29 DIAGNOSIS — R112 Nausea with vomiting, unspecified: Secondary | ICD-10-CM | POA: Diagnosis not present

## 2014-12-29 DIAGNOSIS — R509 Fever, unspecified: Secondary | ICD-10-CM | POA: Diagnosis not present

## 2014-12-29 DIAGNOSIS — R1011 Right upper quadrant pain: Secondary | ICD-10-CM | POA: Insufficient documentation

## 2014-12-29 DIAGNOSIS — Z791 Long term (current) use of non-steroidal anti-inflammatories (NSAID): Secondary | ICD-10-CM | POA: Insufficient documentation

## 2014-12-29 DIAGNOSIS — Z87442 Personal history of urinary calculi: Secondary | ICD-10-CM | POA: Diagnosis not present

## 2014-12-29 DIAGNOSIS — Z79899 Other long term (current) drug therapy: Secondary | ICD-10-CM | POA: Insufficient documentation

## 2014-12-29 HISTORY — DX: Unspecified convulsions: R56.9

## 2014-12-29 LAB — URINE MICROSCOPIC-ADD ON

## 2014-12-29 LAB — URINALYSIS, ROUTINE W REFLEX MICROSCOPIC
Bilirubin Urine: NEGATIVE
GLUCOSE, UA: NEGATIVE mg/dL
KETONES UR: NEGATIVE mg/dL
NITRITE: POSITIVE — AB
PROTEIN: 30 mg/dL — AB
Specific Gravity, Urine: 1.015 (ref 1.005–1.030)
UROBILINOGEN UA: 0.2 mg/dL (ref 0.0–1.0)
pH: 5.5 (ref 5.0–8.0)

## 2014-12-29 LAB — CBC WITH DIFFERENTIAL/PLATELET
Basophils Absolute: 0 10*3/uL (ref 0.0–0.1)
Basophils Relative: 0 % (ref 0–1)
EOS PCT: 0 % (ref 0–5)
Eosinophils Absolute: 0 10*3/uL (ref 0.0–0.7)
HCT: 42.1 % (ref 36.0–46.0)
Hemoglobin: 14.4 g/dL (ref 12.0–15.0)
LYMPHS PCT: 8 % — AB (ref 12–46)
Lymphs Abs: 0.8 10*3/uL (ref 0.7–4.0)
MCH: 30.6 pg (ref 26.0–34.0)
MCHC: 34.2 g/dL (ref 30.0–36.0)
MCV: 89.6 fL (ref 78.0–100.0)
Monocytes Absolute: 0.9 10*3/uL (ref 0.1–1.0)
Monocytes Relative: 9 % (ref 3–12)
NEUTROS PCT: 83 % — AB (ref 43–77)
Neutro Abs: 7.8 10*3/uL — ABNORMAL HIGH (ref 1.7–7.7)
Platelets: 299 10*3/uL (ref 150–400)
RBC: 4.7 MIL/uL (ref 3.87–5.11)
RDW: 13.3 % (ref 11.5–15.5)
WBC: 9.4 10*3/uL (ref 4.0–10.5)

## 2014-12-29 LAB — COMPREHENSIVE METABOLIC PANEL
ALBUMIN: 3.6 g/dL (ref 3.5–5.2)
ALT: 14 U/L (ref 0–35)
AST: 20 U/L (ref 0–37)
Alkaline Phosphatase: 63 U/L (ref 39–117)
Anion gap: 7 (ref 5–15)
BUN: 8 mg/dL (ref 6–23)
CO2: 24 mmol/L (ref 19–32)
Calcium: 8.8 mg/dL (ref 8.4–10.5)
Chloride: 103 mmol/L (ref 96–112)
Creatinine, Ser: 0.9 mg/dL (ref 0.50–1.10)
GFR, EST NON AFRICAN AMERICAN: 81 mL/min — AB (ref >90)
Glucose, Bld: 116 mg/dL — ABNORMAL HIGH (ref 70–99)
Potassium: 3.6 mmol/L (ref 3.5–5.1)
Sodium: 134 mmol/L — ABNORMAL LOW (ref 135–145)
Total Bilirubin: 0.5 mg/dL (ref 0.3–1.2)
Total Protein: 7.4 g/dL (ref 6.0–8.3)

## 2014-12-29 LAB — PREGNANCY, URINE: PREG TEST UR: NEGATIVE

## 2014-12-29 MED ORDER — HYDROMORPHONE HCL 1 MG/ML IJ SOLN
1.0000 mg | Freq: Once | INTRAMUSCULAR | Status: DC
  Filled 2014-12-29: qty 1

## 2014-12-29 MED ORDER — ONDANSETRON 4 MG PO TBDP
8.0000 mg | ORAL_TABLET | Freq: Once | ORAL | Status: AC
  Administered 2014-12-29: 8 mg via ORAL
  Filled 2014-12-29: qty 2

## 2014-12-29 MED ORDER — FENTANYL CITRATE 0.05 MG/ML IJ SOLN
50.0000 ug | Freq: Once | INTRAMUSCULAR | Status: AC
  Administered 2014-12-29: 50 ug via INTRAVENOUS
  Filled 2014-12-29: qty 2

## 2014-12-29 MED ORDER — ACETAMINOPHEN 500 MG PO TABS
1000.0000 mg | ORAL_TABLET | Freq: Once | ORAL | Status: AC
  Administered 2014-12-29: 1000 mg via ORAL
  Filled 2014-12-29: qty 2

## 2014-12-29 MED ORDER — SODIUM CHLORIDE 0.9 % IV BOLUS (SEPSIS)
1000.0000 mL | Freq: Once | INTRAVENOUS | Status: AC
Start: 2014-12-29 — End: 2014-12-29
  Administered 2014-12-29: 1000 mL via INTRAVENOUS

## 2014-12-29 MED ORDER — ONDANSETRON HCL 4 MG/2ML IJ SOLN
4.0000 mg | Freq: Once | INTRAMUSCULAR | Status: DC
  Filled 2014-12-29: qty 2

## 2014-12-29 NOTE — ED Provider Notes (Signed)
CSN: [id number]     Arrival date & time 12/29/14  1633 History   First MD Initiated Contact with Patient 12/29/14 1659     Chief Complaint  Patient presents with  . Abdominal Pain     (Consider location/radiation/quality/duration/timing/severity/associated sxs/prior Treatment) HPI [name] is a 39 year old female with past medical history of seizures who presents the ER complaining of right-sided abdominal pain, nausea, vomiting, fever. Patient reports last night she began running a fever, and throughout the night had several episodes of nonbilious, nonbloody emesis. Patient reports acute onset of abdominal pain this morning. Patient references her right flank to locate her pain, and states the pain radiates into her right upper abdomen. Patient states she has had kidney stones in the past, and her pain feels somewhat similar. Patient states she has been unable to keep food or fluids down since she has been vomiting. Patient denies any headache, dizziness, blurred vision, chest pain, shortness of breath, dysuria, diarrhea.  Past Medical History  Diagnosis Date  . Seizures    History reviewed. No pertinent past surgical history. No family history on file. History  Substance Use Topics  . Smoking status: Current Every Day Smoker  . Smokeless tobacco: Not on file  . Alcohol Use: Yes   OB History    No data available     Review of Systems  Constitutional: Positive for fever.  HENT: Negative for trouble swallowing.   Eyes: Negative for visual disturbance.  Respiratory: Negative for shortness of breath.   Cardiovascular: Negative for chest pain.  Gastrointestinal: Positive for nausea, vomiting and abdominal pain.  Genitourinary: Negative for dysuria.  Musculoskeletal: Negative for neck pain.  Skin: Negative for rash.  Neurological: Negative for dizziness, weakness and numbness.  Psychiatric/Behavioral: Negative.       Allergies  Darvon and Gabapentin  Home Medications    Prior to Admission medications   Medication Sig Start Date End Date Taking? Authorizing Provider  alprazolam Prudy Feeler) 2 MG tablet Take 2 mg by mouth 4 (four) times daily as needed for sleep.   Yes Historical Provider, MD  amphetamine-dextroamphetamine (ADDERALL) 20 MG tablet Take 35 mg by mouth 2 (two) times daily.   Yes Historical Provider, MD  cetirizine (ZYRTEC) 10 MG tablet Take 10 mg by mouth daily.   Yes Historical Provider, MD  diclofenac (CATAFLAM) 50 MG tablet Take 50 mg by mouth 2 (two) times daily.   Yes Historical Provider, MD  fesoterodine (TOVIAZ) 8 MG TB24 tablet Take 8 mg by mouth daily.   Yes Historical Provider, MD  fluticasone (FLOVENT HFA) 220 MCG/ACT inhaler Inhale 2 puffs into the lungs every 4 (four) hours as needed.   Yes Historical Provider, MD  levothyroxine (SYNTHROID, LEVOTHROID) 175 MCG tablet Take 175 mcg by mouth daily before breakfast.   Yes Historical Provider, MD  omeprazole (PRILOSEC) 40 MG capsule Take 40 mg by mouth daily.   Yes Historical Provider, MD  oxyCODONE-acetaminophen (PERCOCET) 10-325 MG per tablet Take 1 tablet by mouth every 4 (four) hours as needed for pain.   Yes Historical Provider, MD   BP 100/53 mmHg  Pulse 101  Temp(Src) 101.6 F (38.7 C) (Oral)  Resp 16  SpO2 96% Physical Exam  Constitutional: She is oriented to person, place, and time. She appears well-developed and well-nourished. No distress.  HENT:  Head: Normocephalic and atraumatic.  Mouth/Throat: Oropharynx is clear and moist. No oropharyngeal exudate.  Eyes: Right eye exhibits no discharge. Left eye exhibits no discharge. No scleral icterus.  Neck: Normal range of motion.  Cardiovascular: Regular rhythm, S1 normal, S2 normal and normal heart sounds.  Tachycardia present.   No murmur heard. Tachycardic--120 on exam  Pulmonary/Chest: Effort normal and breath sounds normal. No respiratory distress.  Abdominal: Soft. Normal appearance and bowel sounds are normal. There is  tenderness in the right upper quadrant. There is CVA tenderness. There is no rigidity, no guarding, no tenderness at McBurney's point and negative Murphy's sign.  Musculoskeletal: Normal range of motion. She exhibits no edema or tenderness.  Neurological: She is alert and oriented to person, place, and time. No cranial nerve deficit. Coordination normal.  Skin: Skin is warm and dry. No rash noted. She is not diaphoretic.  Psychiatric: She has a normal mood and affect.  Nursing note and vitals reviewed.   ED Course  Procedures (including critical care time) Labs Review Labs Reviewed  CBC WITH DIFFERENTIAL/PLATELET - Abnormal; Notable for the following:    Neutrophils Relative % 83 (*)    Neutro Abs 7.8 (*)    Lymphocytes Relative 8 (*)    All other components within normal limits  COMPREHENSIVE METABOLIC PANEL - Abnormal; Notable for the following:    Sodium 134 (*)    Glucose, Bld 116 (*)    GFR calc non Af Amer 81 (*)    All other components within normal limits  URINALYSIS, ROUTINE W REFLEX MICROSCOPIC - Abnormal; Notable for the following:    APPearance CLOUDY (*)    Hgb urine dipstick MODERATE (*)    Protein, ur 30 (*)    Nitrite POSITIVE (*)    Leukocytes, UA SMALL (*)    All other components within normal limits  URINE MICROSCOPIC-ADD ON - Abnormal; Notable for the following:    Bacteria, UA MANY (*)    All other components within normal limits  PREGNANCY, URINE    Imaging Review No results found.   EKG Interpretation None      MDM   Final diagnoses:  Right flank pain    Patient eloped after my assessment and after labs were obtained. We'll try to follow up with patient through the flow manager to notify her of a urinary tract infection. With a UTI and fever, there could be alternate pathology that was not able to be fully addressed tonight with her leaving prior to imaging studies and further workup.  Signed,  Ladona MowJoe Malya Cirillo, PA-C 3:14 AM     Monte FantasiaJoseph W  Miaisabella Bacorn, PA-C 12/30/14 [id number]  Tilden FossaElizabeth Rees, MD 01/01/15 0700

## 2014-12-29 NOTE — ED Notes (Signed)
The pt has had a temp nv since this am with abd pain  No diarrhea.  A family member was just ill with the same symptoms this past week.  lmp none

## 2014-12-29 NOTE — ED Notes (Signed)
Pt called out for discharge paper work; was told not up for discharge as of yet waiting for CT scan per PA; Pt seen with family member 5 minutes later leaving Ed; PA notified

## 2014-12-29 NOTE — ED Notes (Signed)
Pt. C/o headahce.

## 2015-01-02 ENCOUNTER — Ambulatory Visit: Payer: Self-pay | Admitting: Pain Medicine

## 2015-01-15 ENCOUNTER — Ambulatory Visit: Payer: Self-pay | Admitting: Pain Medicine

## 2015-02-04 ENCOUNTER — Ambulatory Visit
Admit: 2015-02-04 | Disposition: A | Discharge status: Home / Self Care | Payer: Medicaid Other | Attending: Pain Medicine | Admitting: Pain Medicine

## 2015-02-12 ENCOUNTER — Ambulatory Visit
Admit: 2015-02-12 | Disposition: A | Discharge status: Home / Self Care | Payer: Self-pay | Attending: Pain Medicine | Admitting: Pain Medicine

## 2015-02-14 NOTE — Discharge Summary (Signed)
PATIENT NAME:  Cathy MurdochWATSON, Eliani A MR#:  [id number] DATE OF BIRTH:  1976-02-07  DATE OF ADMISSION:  12/18/2012 DATE OF DISCHARGE:  12/21/2012  PRIMARY CARE PHYSICIAN: Maralyn SagoSarah "Sallie" Allena KatzPatel, MD   DISCHARGE DIAGNOSES: 1. Pseudoseizure.  2. Anxiety and stress.  3. Encephalopathy.  4. Hypothyroidism.  5. Chronic pain.  6. Otitis media.  7. Gastroesophageal reflux disease.   MEDICATIONS ON DISCHARGE:  Prilosec 40 mg daily, ibuprofen 600 mg every 6 hours p.r.n. pain, Zyrtec 10 mg daily, Xanax 2 mg 3 times a day, Robaxin 500 mg every 8 hours as needed, Percocet 10/325, 1 tablet 1 to 2 times a day as needed for pain, Fanapt 2 mg, 2 tablets at bedtime, Restoril 15 mg at bedtime, Augmentin 875 mg twice a day for 9 days, levothyroxine 200 mcg daily, Maxalt 10 mg oral disintegrating tablet as needed for migraine.   DIET: Regular diet, regular consistency.   ACTIVITY: Activity as tolerated.   FOLLOWUP:  1. Dr. Janeece RiggersSu, Psychiatry, I think she has an appointment shortly.   2. Dr. Hillery AldoSarah Patel in 1 to 2 weeks.   REASON FOR ADMISSION: The patient was admitted 12/18/2012, discharged 12/21/2012. The patient came in with unresponsiveness and seizure.   HISTORY OF PRESENT ILLNESS: A 39 year old female with history of cholesteatoma of the right ear, chronic back pain, anemia, bipolar disorder. She presents with seizure-like activity and unresponsiveness and repeated episodes. [facility] and signed out AGAINST MEDICAL ADVICE. She was admitted with seizure activity, hypomagnesemia, anxiety, depression, chronic back pain, tobacco abuse and hypothyroidism.  She was admitted to the medical floor. An EEG was ordered, an MRI of the brain, Neurology consultation with Dr. Sherryll BurgerShah, Psychiatry consultation by Dr. Toni Amendlapacs.    HOSPITAL LABORATORY AND RADIOLOGICAL DATA:  White blood cell count of 6.4, H and H 14.4 and 43.4, platelet count of 258. Dilantin 1.5. Magnesium 1.6, lipase 77, glucose 99, BUN 12, creatinine  0.73, sodium 139, potassium 3.9, chloride 110, CO2 22, calcium 8.6. Liver function tests: AST slightly elevated at 55. Urinalysis: Trace leukocyte esterase. Urine toxicology: Positive for opiates and benzodiazepine.  Chest x-ray: No acute cardiopulmonary disease. CT scan of the orbits, sella without contrast showed further improvement of the appearance of the middle ear structures on the right. MRI of the brain showed mild white matter changes noted consistent with chronic ischemia. No acute abnormality. EEG in awake and sleep states within normal limits. Decreased responsiveness with normal background raises the possibility of psychogenic nonepileptic spells.   HOSPITAL COURSE PER PROBLEM LIST:  1. The patient was diagnosed with pseudoseizures, testing here has been negative. Dr. Sherryll BurgerShah saw in consultation. I did mention to the patient that the [facility] units if pseudoseizures become persistent for her. I believe this is all secondary to anxiety and stress.  2. For the patient's anxiety stress and stress, the patient was seen in consultation by Dr. Toni Amendlapacs, who believed that the patient did not need any further monitoring down on the psychiatric unit. He recommended going down on the Xanax to a p.r.n. basis. I do think the patient may be overmedicated and is very concerned about her medications. I recommend following up with Dr. Janeece RiggersSu as outpatient.  3. Encephalopathy:  I think this is all secondary to the pseudoseizure.  4. Hypothyroidism: The patient is on Synthroid.  5. Chronic pain: She does take Percocet. She hides this from her husband, who is a substance abuser and  just got out of rehab. He does not know that she takes pain medication.  6. Otitis media: The patient was started on Augmentin.  7. Gastroesophageal reflux disease: She is on Prilosec.   Overall prognosis is poor with her psychiatric issues. I did advise no driving with the  pseudoseizure episodes. Close clinical followup with Dr. Janeece Riggers is needed for mood stabilization. Followup with Dr. Hillery Aldo for other medical issues.  In  speaking with Dr. Sherryll Burger, Neurology, the cholesteatoma does not increase the risk of seizure. The patient's seizure medications were stopped since this is pseudoseizure.   TIME SPENT ON DISCHARGE: 35 minutes.   ____________________________ Herschell Dimes. Renae Gloss, MD rjw:cb D: 12/21/2012 15:23:40 ET T: 12/21/2012 17:47:56 ET JOB#: [id number]  cc: Herschell Dimes. Renae Gloss, MD, <Dictator> Sarah "Sallie" Allena Katz, MD Lynett Fish, MD, Psychiatry Salley Scarlet MD ELECTRONICALLY SIGNED 12/26/2012 13:15
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
## 2015-02-14 NOTE — Consult Note (Signed)
Brief Consult Note: Diagnosis: bipolar disorder type 2.   Patient was seen by consultant.   Consult note dictated.   Recommend further assessment or treatment.   Orders entered.   Comments: Psychiatry: Patient seen. Hx of bipolar with only partial benifit from meds. High level of life stress. Can't rule in "pseudo seizures" but can say it seems likely that emotional difficulty is adding to her problem. I agree with restarting all her usual meds but I'm changing the xanax to prn and increasing the synthroid to what she now says is the correct dose. Also going to try a qhs Maxalt 10mg  tonight for her headache. Will follow.  Electronic Signatures: Audery Amellapacs, Jolyssa Oplinger T (MD)  (Signed 26-Feb-14 18:00)  Authored: Brief Consult Note   Last Updated: 26-Feb-14 18:00 by Audery Amellapacs, Lezli Danek T (MD)
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
## 2015-02-14 NOTE — H&P (Signed)
PATIENT NAME:  [name], [name] MR#:  [id number] DATE OF BIRTH:  26-Aug-1976  DATE OF ADMISSION:  12/18/2012  PRIMARY CARE PHYSICIAN: Hillery AldoSarah Patel, MD REFERRING PHYSICIAN: Malachy Moanevainder Goli, MD   CHIEF COMPLAINT: Unresponsiveness yesterday and seizure 3 days ago.   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 The patient is alert, awake, oriented, in no acute distress. According to the patient and her husband, the patient was unresponsive for about 5 minutes yesterday and recovered without any seizure or incontinence. They called her ENT physician, who advised the patient to come to the ED for further evaluation. Also, the patient's husband mentioned that the patient had Foley seizure 4 times 3 days ago, was [facility], but the patient's left AMA.  Per her husband, the patient had bilateral arm shaking with bilateral lower extremity contracture. In addition, the patient lost consciousness but no incontinence.  The patient cannot remember what happened to her, but she complains of headache, dizziness, weakness now. She denies any incontinence. According to Dr. Enedina FinnerGoli, the patient had 1 episode of unresponsiveness during Foley CT test just now. According to the patient's husband, the patient had 1 episode of seizure 4 years ago.   PAST MEDICAL HISTORY: Bipolar disorder, depression, brain tumor status post surgery, carpal tunnel syndrome, chronic back pain on Percocet, anemia, arthritis, hypothyroidism, brain tumor surgery, back surgery, hysterectomy, C-section and tubal ligation.   SOCIAL HISTORY: The patient smoked 2 packs Foley day before but decreased to 1/2 pack to 1 pack Foley day for many years. Denies any alcohol drinking or illicit drugs.   FAMILY HISTORY: No seizure, cancer,  hypertension, diabetes, heart attack or stroke.   HOME MEDICATIONS: Xanax 2 mg p.o. t.i.d., Robaxin 500 mg p.o. tablet every 8 hours p.r.n., Restoril 15 mg p.o. once daily at bedtime, Prilosec 40 mg p.o. once daily, Percocet 10/325 mg p.o. 1 tablet 1 to 2 times Foley day p.r.n., levothyroxine 150 mcg p.o. daily, Ibu-600 mg p.o. tablets, 1 tablet every 6 hours p.r.n., Fanapt  2 mg p.o. 2 tablets once Foley day at bedtime, Zyrtec 10 mg p.o. once Foley day in the evening.   REVIEW OF SYSTEMS:  CONSTITUTIONAL: The patient complains of Foley headache, dizziness, weakness but no fever or chills.   EYES: No double vision, blurred vision.  ENT: No epistaxis. No postnasal drip, slurred speech, or dysphagia.  CARDIOVASCULAR: No chest pain, palpitation, orthopnea or nocturnal dyspnea. No leg edema.  PULMONARY: No cough, sputum, shortness of breath or hematemesis.  GASTROINTESTINAL: No abdominal pain, nausea, vomiting or diarrhea. No melena or bloody stool.  GENITOURINARY: No dysuria, hematuria or incontinence.  SKIN: No rash or jaundice.  NEUROLOGY: Positive for seizure, loss of consciousness, but no incontinence, no facial droop.  SKIN: No rash or jaundice.   PHYSICAL EXAMINATION: VITAL SIGNS: Temperature 98.1, blood pressure 120/70, pulse 78, respirations 18, oxygen saturation 96% on room air.  GENERAL: The patient is alert, awake, oriented, looks confused but in no acute distress.  HEENT: Pupils are round, equal, reactive to light, accommodation. Moist oral mucosa. Clear oropharynx. No discharge from ear or nose. Ears: Mild tenderness on the right ear but no erythema or discharge.  NECK: Supple. No JVD or carotid bruits. No lymphadenopathy. No thyromegaly.  CARDIOVASCULAR: S1, S2, regular  rate and rhythm. No murmurs or gallops.  PULMONARY: Bilateral air entry. No wheezing or rales. No use of accessory muscles to breathe.  ABDOMEN: Obese, soft, bowel sounds present. No distention or tenderness. No organomegaly.    EXTREMITIES: No edema, clubbing, or cyanosis. No calf tenderness. Bilateral pedal pulses present.  SKIN: No rash or jaundice.  NEUROLOGICAL: Foley and O x 3. No focal deficit. Power 5 out of 5. Sensation intact.   LABORATORY AND RADIOLOGICAL DATA:  CAT scan of head showed mild improvement in appearance of the middle ear structure on the right side. The tympanic membranes remains thickened.  No abnormality is demonstrated in the left temporal bone. Chest x-ray: No abnormality.  Urine toxicity showed benzodiazepine positive and opiate positive. Urinalysis is negative.  Glucose 99, BUN of 12, creatinine 0.73, potassium 3.9, chloride 110, lipase 77, magnesium 1.6. CBC normal.   IMPRESSION: 1. Questionable seizure.  2. Hypomagnesemia.  3. Anxiety.  4. Bipolar disorder.  5. Chronic back pain.  6. Hypothyroidism.  7. Tobacco abuse.   PLAN OF TREATMENT:  1. The patient will be admitted to the medical floor. We will start seizure, fall and aspiration precautions. We will get an EEG and start Dilantin and Keppra.  We will give Ativan p.r.n., follow up with Dr. Sherryll Burger. Dr. Enedina Finner already talked with Dr. Sherryll Burger, neurologist. He will see the patient.  2. We will hold Fanapt, which may cause lethargy and EPS, and also we will hold Robaxin which has Foley side effect of seizure.  3. Smoking cessation was counseled for 5 minutes. We will give nicotine inhaler. The patient said that the nicotine patch does not work for her.   I discussed the patient's condition and the plan of treatment with the patient and her husband.   CODE STATUS:  The patient wants FULL CODE.    TIME SPENT: About 72 minutes.  ____________________________ Shaune Pollack, MD qc:cb D: 12/18/2012 16:12:44 ET T: 12/18/2012 16:56:35 ET JOB#: [id number]  cc: Shaune Pollack, MD, <Dictator> Shaune Pollack MD ELECTRONICALLY SIGNED 12/20/2012 16:38
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
## 2015-03-06 ENCOUNTER — Encounter: Payer: Self-pay | Admitting: Pain Medicine

## 2015-03-06 ENCOUNTER — Encounter (INDEPENDENT_AMBULATORY_CARE_PROVIDER_SITE_OTHER): Payer: Self-pay

## 2015-03-06 ENCOUNTER — Ambulatory Visit: Payer: Medicaid Other | Attending: Pain Medicine | Admitting: Pain Medicine

## 2015-03-06 VITALS — BP 123/60 | HR 108 | Temp 97.5°F | Resp 16 | Ht 62.0 in | Wt 183.0 lb

## 2015-03-06 DIAGNOSIS — M961 Postlaminectomy syndrome, not elsewhere classified: Secondary | ICD-10-CM | POA: Diagnosis not present

## 2015-03-06 DIAGNOSIS — M5481 Occipital neuralgia: Secondary | ICD-10-CM | POA: Insufficient documentation

## 2015-03-06 DIAGNOSIS — M51369 Other intervertebral disc degeneration, lumbar region without mention of lumbar back pain or lower extremity pain: Secondary | ICD-10-CM | POA: Insufficient documentation

## 2015-03-06 DIAGNOSIS — G43909 Migraine, unspecified, not intractable, without status migrainosus: Secondary | ICD-10-CM | POA: Insufficient documentation

## 2015-03-06 DIAGNOSIS — M47818 Spondylosis without myelopathy or radiculopathy, sacral and sacrococcygeal region: Secondary | ICD-10-CM

## 2015-03-06 DIAGNOSIS — M542 Cervicalgia: Secondary | ICD-10-CM | POA: Diagnosis present

## 2015-03-06 DIAGNOSIS — M5030 Other cervical disc degeneration, unspecified cervical region: Secondary | ICD-10-CM

## 2015-03-06 DIAGNOSIS — M4806 Spinal stenosis, lumbar region: Secondary | ICD-10-CM | POA: Diagnosis not present

## 2015-03-06 DIAGNOSIS — M5136 Other intervertebral disc degeneration, lumbar region: Secondary | ICD-10-CM | POA: Insufficient documentation

## 2015-03-06 DIAGNOSIS — M549 Dorsalgia, unspecified: Secondary | ICD-10-CM | POA: Diagnosis present

## 2015-03-06 DIAGNOSIS — M461 Sacroiliitis, not elsewhere classified: Secondary | ICD-10-CM

## 2015-03-06 MED ORDER — OXYCODONE HCL 10 MG PO TABS: ORAL_TABLET | ORAL | Status: DC

## 2015-03-06 MED ORDER — DICLOFENAC POTASSIUM 50 MG PO TABS: ORAL_TABLET | ORAL | Status: DC

## 2015-03-06 NOTE — Progress Notes (Signed)
Patient-year-old female returns to pain management Center for further evaluation and treatment of pain involving the neck and entire back upper and lower posterior regions states that her pain is aggravated by standing and walking We discussed patient's condition and will consider patient for interventional treatment at time return appointment. Patient is understanding and in status treatment plan   Physical examination  Tenderness over the paraspinal musculature region cervical region cervical facet region degree Tenderness of the splenius capitis and the subcutaneous talus regions of moderate degree. Tinnitus of the lumbar paraspinal musculature region of severe degree with severe tenderness of the lumbar facet region with rotation reproducing severe discomfort raising limited to 20 without an increase of pain with dorsiflexion noted tenderness of the piriformis muscles as well as the PSIS region. Tenderness to palpation of costovertebral tenderness noted  Assessment  Degenerative disc disease lumbar spine Status post lumbar hemilaminectomy with normal stenosis and facet arthrosis   Facet syndrome lumbar region  Degenerative disc disease of the cervical spine  Sacroiliac joint dysfunction  Migraine headache  Greater occipital neuralgia   Plan  Continue present medications  Consider lumbar epidural steroid injection a return appointment  Follow-up [name] and [name]  Psych follow-up evaluation as well as surgical in neurological reevaluation  Consider radiofrequency and intraspinal implantations pending follow-up evaluation  Patient to call Pain Management Center should any change in condition prior to scheduled return appointment

## 2015-03-06 NOTE — Progress Notes (Signed)
   Subjective:    Patient ID: Cathy MurdochPatricia A Foley, female    DOB: 09/17/1976, 39 y.o.   MRN: [id number]  HPI    Review of Systems     Objective:   Physical Exam        Assessment & Plan:

## 2015-03-06 NOTE — Patient Instructions (Addendum)
Continue present medications. Oxycodone Cataflam Robaxin  Lumbar epidural steroid injection 03/19/2015  F/U PCP for evaliation of  BP and general medical  condition.  F/U surgical evaluation.  F/U nrurological evaluation.  May consider radiofrequency rhizolysis or intraspinal procedures pending response to present treatment and F/U evaluation.  Patient to call [facility] should patient have concerns prior to scheduled return appointment. GENERAL RISKS AND COMPLICATIONS  What are the risk, side effects and possible complications? Generally speaking, most procedures are safe.  However, with any procedure there are risks, side effects, and the possibility of complications.  The risks and complications are dependent upon the sites that are lesioned, or the type of nerve block to be performed.  The closer the procedure is to the spine, the more serious the risks are.  Great care is taken when placing the radio frequency needles, block needles or lesioning probes, but sometimes complications can occur. 1. Infection: Any time there is an injection through the skin, there is a risk of infection.  This is why sterile conditions are used for these blocks.  There are four possible types of infection. 1. Localized skin infection. 2. Central Nervous System Infection-This can be in the form of Meningitis, which can be deadly. 3. Epidural Infections-This can be in the form of an epidural abscess, which can cause pressure inside of the spine, causing compression of the spinal cord with subsequent paralysis. This would require an emergency surgery to decompress, and there are no guarantees that the patient would recover from the paralysis. 4. Discitis-This is an infection of the intervertebral discs.  It occurs in about 1% of discography procedures.  It is difficult to treat and it may lead to surgery.        2. Pain: the needles have to go through skin and soft tissues, will cause soreness.  3. Damage to internal structures:  The nerves to be lesioned may be near blood vessels or    other nerves which can be potentially damaged.       4. Bleeding: Bleeding is more common if the patient is taking blood thinners such as  aspirin, Coumadin, Ticiid, Plavix, etc., or if he/she have some genetic predisposition  such as hemophilia. Bleeding into the spinal canal can cause compression of the spinal  cord with subsequent paralysis.  This would require an emergency surgery to  decompress and there are no guarantees that the patient would recover from the  paralysis.       5. Pneumothorax:  Puncturing of a lung is a possibility, every time a needle is introduced in  the area of the chest or upper back.  Pneumothorax refers to free air around the  collapsed lung(s), inside of the thoracic cavity (chest cavity).  Another two possible  complications related to a similar event would include: Hemothorax and Chylothorax.   These are variations of the Pneumothorax, where instead of air around the collapsed  lung(s), you may have blood or chyle, respectively.       6. Spinal headaches: They may occur with any procedures in the area of the spine.       7. Persistent CSF (Cerebro-Spinal Fluid) leakage: This is a rare problem, but may occur  with prolonged intrathecal or epidural catheters either due to the formation of a fistulous  track or a dural tear.       8. Nerve damage: By working so close to the spinal cord, there is always a possibility of  nerve damage,  which could be as serious as a permanent spinal cord injury with  paralysis.       9. Death:  Although rare, severe deadly allergic reactions known as "Anaphylactic  reaction" can occur to any of the medications used.      10. Worsening of the symptoms:  We can always make thing worse.  What are the chances of something like this happening? Chances of any of this occuring are extremely low.  By statistics, you have more of a chance of getting killed in a  motor vehicle accident: while driving to the hospital than any of the above occurring .  Nevertheless, you should be aware that they are possibilities.  In general, it is similar to taking a shower.  Everybody knows that you can slip, hit your head and get killed.  Does that mean that you should not shower again?  Nevertheless always keep in mind that statistics do not mean anything if you happen to be on the wrong side of them.  Even if a procedure has a 1 (one) in a 1,000,000 (million) chance of going wrong, it you happen to be that one..Also, keep in mind that by statistics, you have more of a chance of having something go wrong when taking medications.  Who should not have this procedure? If you are on a blood thinning medication (e.g. Coumadin, Plavix, see list of "Blood Thinners"), or if you have an active infection going on, you should not have the procedure.  If you are taking any blood thinners, please inform your physician.  How should I prepare for this procedure?  Do not eat or drink anything at least six hours prior to the procedure.  Bring a driver with you .  It cannot be a taxi.  Come accompanied by an adult that can drive you back, and that is strong enough to help you if your legs get weak or numb from the local anesthetic.  Take all of your medicines the morning of the procedure with just enough water to swallow them.  If you have diabetes, make sure that you are scheduled to have your procedure done first thing in the morning, whenever possible.  If you have diabetes, take only half of your insulin dose and notify our nurse that you have done so as soon as you arrive at the clinic.  If you are diabetic, but only take blood sugar pills (oral hypoglycemic), then do not take them on the morning of your procedure.  You may take them after you have had the procedure.  Do not take aspirin or any aspirin-containing medications, at least eleven (11) days prior to the procedure.  They  may prolong bleeding.  Wear loose fitting clothing that may be easy to take off and that you would not mind if it got stained with Betadine or blood.  Do not wear any jewelry or perfume  Remove any nail coloring.  It will interfere with some of our monitoring equipment.  NOTE: Remember that this is not meant to be interpreted as a complete list of all possible complications.  Unforeseen problems may occur.  BLOOD THINNERS The following drugs contain aspirin or other products, which can cause increased bleeding during surgery and should not be taken for 2 weeks prior to and 1 week after surgery.  If you should need take something for relief of minor pain, you may take acetaminophen which is found in Tylenol,m Datril, Anacin-3 and Panadol. It is not blood thinner. The  products listed below are.  Do not take any of the products listed below in addition to any listed on your instruction sheet.  A.P.C or A.P.C with Codeine Codeine Phosphate Capsules #3 Ibuprofen Ridaura  ABC compound Congesprin Imuran rimadil  Advil Cope Indocin Robaxisal  Alka-Seltzer Effervescent Pain Reliever and Antacid Coricidin or Coricidin-D  Indomethacin Rufen  Alka-Seltzer plus Cold Medicine Cosprin Ketoprofen S-A-C Tablets  Anacin Analgesic Tablets or Capsules Coumadin Korlgesic Salflex  Anacin Extra Strength Analgesic tablets or capsules CP-2 Tablets Lanoril Salicylate  Anaprox Cuprimine Capsules Levenox Salocol  Anexsia-D Dalteparin Magan Salsalate  Anodynos Darvon compound Magnesium Salicylate Sine-off  Ansaid Dasin Capsules Magsal Sodium Salicylate  Anturane Depen Capsules Marnal Soma  APF Arthritis pain formula Dewitt's Pills Measurin Stanback  Argesic Dia-Gesic Meclofenamic Sulfinpyrazone  Arthritis Bayer Timed Release Aspirin Diclofenac Meclomen Sulindac  Arthritis pain formula Anacin Dicumarol Medipren Supac  Analgesic (Safety coated) Arthralgen Diffunasal Mefanamic Suprofen  Arthritis Strength Bufferin  Dihydrocodeine Mepro Compound Suprol  Arthropan liquid Dopirydamole Methcarbomol with Aspirin Synalgos  ASA tablets/Enseals Disalcid Micrainin Tagament  Ascriptin Doan's Midol Talwin  Ascriptin A/D Dolene Mobidin Tanderil  Ascriptin Extra Strength Dolobid Moblgesic Ticlid  Ascriptin with Codeine Doloprin or Doloprin with Codeine Momentum Tolectin  Asperbuf Duoprin Mono-gesic Trendar  Aspergum Duradyne Motrin or Motrin IB Triminicin  Aspirin plain, buffered or enteric coated Durasal Myochrisine Trigesic  Aspirin Suppositories Easprin Nalfon Trillsate  Aspirin with Codeine Ecotrin Regular or Extra Strength Naprosyn Uracel  Atromid-S Efficin Naproxen Ursinus  Auranofin Capsules Elmiron Neocylate Vanquish  Axotal Emagrin Norgesic Verin  Azathioprine Empirin or Empirin with Codeine Normiflo Vitamin E  Azolid Emprazil Nuprin Voltaren  Bayer Aspirin plain, buffered or children's or timed BC Tablets or powders Encaprin Orgaran Warfarin Sodium  Buff-a-Comp Enoxaparin Orudis Zorpin  Buff-a-Comp with Codeine Equegesic Os-Cal-Gesic   Buffaprin Excedrin plain, buffered or Extra Strength Oxalid   Bufferin Arthritis Strength Feldene Oxphenbutazone   Bufferin plain or Extra Strength Feldene Capsules Oxycodone with Aspirin   Bufferin with Codeine Fenoprofen Fenoprofen Pabalate or Pabalate-SF   Buffets II Flogesic Panagesic   Buffinol plain or Extra Strength Florinal or Florinal with Codeine Panwarfarin   Buf-Tabs Flurbiprofen Penicillamine   Butalbital Compound Four-way cold tablets Penicillin   Butazolidin Fragmin Pepto-Bismol   Carbenicillin Geminisyn Percodan   Carna Arthritis Reliever Geopen Persantine   Carprofen Gold's salt Persistin   Chloramphenicol Goody's Phenylbutazone   Chloromycetin Haltrain Piroxlcam   Clmetidine heparin Plaquenil   Cllnoril Hyco-pap Ponstel   Clofibrate Hydroxy chloroquine Propoxyphen         Before stopping any of these medications, be sure to consult the  physician who ordered them.  Some, such as Coumadin (Warfarin) are ordered to prevent or treat serious conditions such as "deep thrombosis", "pumonary embolisms", and other heart problems.  The amount of time that you may need off of the medication may also vary with the medication and the reason for which you were taking it.  If you are taking any of these medications, please make sure you notify your pain physician before you undergo any procedures.         Epidural Steroid Injection Patient Information  Description: The epidural space surrounds the nerves as they exit the spinal cord.  In some patients, the nerves can be compressed and inflamed by a bulging disc or a tight spinal canal (spinal stenosis).  By injecting steroids into the epidural space, we can bring irritated nerves into direct contact with a potentially helpful medication.  These  steroids act directly on the irritated nerves and can reduce swelling and inflammation which often leads to decreased pain.  Epidural steroids may be injected anywhere along the spine and from the neck to the low back depending upon the location of your pain.   After numbing the skin with local anesthetic (like Novocaine), a small needle is passed into the epidural space slowly.  You may experience a sensation of pressure while this is being done.  The entire block usually last less than 10 minutes.  Conditions which may be treated by epidural steroids:   Low back and leg pain  Neck and arm pain  Spinal stenosis  Post-laminectomy syndrome  Herpes zoster (shingles) pain  Pain from compression fractures  Preparation for the injection:  1. Do not eat any solid food or dairy products within 6 hours of your appointment.  2. You may drink clear liquids up to 2 hours before appointment.  Clear liquids include water, black coffee, juice or soda.  No milk or cream please. 3. You may take your regular medication, including pain medications, with a  sip of water before your appointment  Diabetics should hold regular insulin (if taken separately) and take 1/2 normal NPH dos the morning of the procedure.  Carry some sugar containing items with you to your appointment. 4. A driver must accompany you and be prepared to drive you home after your procedure.  5. Bring all your current medications with your. 6. An IV may be inserted and sedation may be given at the discretion of the physician.   7. A blood pressure cuff, EKG and other monitors will often be applied during the procedure.  Some patients may need to have extra oxygen administered for a short period. 8. You will be asked to provide medical information, including your allergies, prior to the procedure.  We must know immediately if you are taking blood thinners (like Coumadin/Warfarin)  Or if you are allergic to IV iodine contrast (dye). We must know if you could possible be pregnant.  Possible side-effects:  Bleeding from needle site  Infection (rare, may require surgery)  Nerve injury (rare)  Numbness & tingling (temporary)  Difficulty urinating (rare, temporary)  Spinal headache ( a headache worse with upright posture)  Light -headedness (temporary)  Pain at injection site (several days)  Decreased blood pressure (temporary)  Weakness in arm/leg (temporary)  Pressure sensation in back/neck (temporary)  Call if you experience:  Fever/chills associated with headache or increased back/neck pain.  Headache worsened by an upright position.  New onset weakness or numbness of an extremity below the injection site  Hives or difficulty breathing (go to the emergency room)  Inflammation or drainage at the infection site  Severe back/neck pain  Any new symptoms which are concerning to you  Please note:  Although the local anesthetic injected can often make your back or neck feel good for several hours after the injection, the pain will likely return.  It takes 3-7  days for steroids to work in the epidural space.  You may not notice any pain relief for at least that one week.  If effective, we will often do a series of three injections spaced 3-6 weeks apart to maximally decrease your pain.  After the initial series, we generally will wait several months before considering a repeat injection of the same type.  If you have any questions, please call [phone] [facility]

## 2015-03-06 NOTE — Progress Notes (Signed)
Discharged ambulatory, script given for Oxycodone, e-script sent for diclofenac

## 2015-03-19 ENCOUNTER — Other Ambulatory Visit: Payer: Self-pay | Admitting: Pain Medicine

## 2015-03-28 ENCOUNTER — Other Ambulatory Visit: Payer: Self-pay | Admitting: Pain Medicine

## 2015-03-28 DIAGNOSIS — M5481 Occipital neuralgia: Secondary | ICD-10-CM

## 2015-03-28 DIAGNOSIS — M961 Postlaminectomy syndrome, not elsewhere classified: Secondary | ICD-10-CM

## 2015-03-28 DIAGNOSIS — M179 Osteoarthritis of knee, unspecified: Secondary | ICD-10-CM | POA: Insufficient documentation

## 2015-03-28 DIAGNOSIS — M47818 Spondylosis without myelopathy or radiculopathy, sacral and sacrococcygeal region: Secondary | ICD-10-CM

## 2015-03-28 DIAGNOSIS — M170 Bilateral primary osteoarthritis of knee: Secondary | ICD-10-CM

## 2015-03-28 DIAGNOSIS — M461 Sacroiliitis, not elsewhere classified: Secondary | ICD-10-CM

## 2015-03-28 DIAGNOSIS — M5136 Other intervertebral disc degeneration, lumbar region: Secondary | ICD-10-CM

## 2015-03-28 DIAGNOSIS — M1710 Unilateral primary osteoarthritis, unspecified knee: Secondary | ICD-10-CM | POA: Insufficient documentation

## 2015-03-28 DIAGNOSIS — M5030 Other cervical disc degeneration, unspecified cervical region: Secondary | ICD-10-CM

## 2015-03-31 ENCOUNTER — Encounter: Payer: Self-pay | Admitting: Pain Medicine

## 2015-03-31 ENCOUNTER — Ambulatory Visit: Payer: Medicaid Other | Attending: Pain Medicine | Admitting: Pain Medicine

## 2015-03-31 VITALS — BP 93/54 | HR 67 | Temp 98.1°F | Resp 18 | Ht 62.0 in | Wt 182.0 lb

## 2015-03-31 DIAGNOSIS — M5126 Other intervertebral disc displacement, lumbar region: Secondary | ICD-10-CM | POA: Insufficient documentation

## 2015-03-31 DIAGNOSIS — M4806 Spinal stenosis, lumbar region: Secondary | ICD-10-CM | POA: Insufficient documentation

## 2015-03-31 DIAGNOSIS — M545 Low back pain: Secondary | ICD-10-CM | POA: Diagnosis present

## 2015-03-31 DIAGNOSIS — Z9889 Other specified postprocedural states: Secondary | ICD-10-CM | POA: Diagnosis not present

## 2015-03-31 DIAGNOSIS — M47816 Spondylosis without myelopathy or radiculopathy, lumbar region: Secondary | ICD-10-CM | POA: Diagnosis not present

## 2015-03-31 DIAGNOSIS — M5136 Other intervertebral disc degeneration, lumbar region: Secondary | ICD-10-CM | POA: Diagnosis not present

## 2015-03-31 DIAGNOSIS — M5030 Other cervical disc degeneration, unspecified cervical region: Secondary | ICD-10-CM

## 2015-03-31 DIAGNOSIS — M5481 Occipital neuralgia: Secondary | ICD-10-CM

## 2015-03-31 DIAGNOSIS — M961 Postlaminectomy syndrome, not elsewhere classified: Secondary | ICD-10-CM

## 2015-03-31 DIAGNOSIS — M79605 Pain in left leg: Secondary | ICD-10-CM | POA: Diagnosis present

## 2015-03-31 DIAGNOSIS — M47818 Spondylosis without myelopathy or radiculopathy, sacral and sacrococcygeal region: Secondary | ICD-10-CM

## 2015-03-31 DIAGNOSIS — M170 Bilateral primary osteoarthritis of knee: Secondary | ICD-10-CM

## 2015-03-31 DIAGNOSIS — M461 Sacroiliitis, not elsewhere classified: Secondary | ICD-10-CM

## 2015-03-31 DIAGNOSIS — M79604 Pain in right leg: Secondary | ICD-10-CM | POA: Diagnosis present

## 2015-03-31 MED ORDER — SODIUM CHLORIDE 0.9 % IJ SOLN
INTRAMUSCULAR | Status: AC
  Administered 2015-03-31: 4 mL
  Filled 2015-03-31: qty 20

## 2015-03-31 MED ORDER — TRIAMCINOLONE ACETONIDE 40 MG/ML IJ SUSP
INTRAMUSCULAR | Status: AC
  Administered 2015-03-31: 40 mg
  Filled 2015-03-31: qty 1

## 2015-03-31 MED ORDER — MIDAZOLAM HCL 5 MG/5ML IJ SOLN
INTRAMUSCULAR | Status: AC
  Administered 2015-03-31: 4 mg via INTRAVENOUS
  Filled 2015-03-31: qty 5

## 2015-03-31 MED ORDER — DICLOFENAC POTASSIUM 50 MG PO TABS: ORAL_TABLET | ORAL | Status: DC

## 2015-03-31 MED ORDER — OXYCODONE HCL 10 MG PO TABS: ORAL_TABLET | ORAL | Status: DC

## 2015-03-31 MED ORDER — LIDOCAINE HCL (PF) 1 % IJ SOLN
INTRAMUSCULAR | Status: AC
  Administered 2015-03-31: 4 mL
  Filled 2015-03-31: qty 5

## 2015-03-31 MED ORDER — FENTANYL CITRATE (PF) 100 MCG/2ML IJ SOLN
INTRAMUSCULAR | Status: AC
  Administered 2015-03-31: 100 ug via INTRAVENOUS
  Filled 2015-03-31: qty 2

## 2015-03-31 NOTE — Progress Notes (Signed)
Subjective:    Patient ID: Cathy MurdochPatricia A Foley, female    DOB: 11/24/1975, 39 y.o.   MRN: [id number]  HPI  PROCEDURE PERFORMED: Lumbar epidural steroid injection   NOTE: The patient is a 39 y.o. female who returns to [facility] for further evaluation and treatment of pain involving the lumbar and lower extremity region. MRI revealed the patient to be with degenerative changes with postoperative changes from prior hemilaminectomy L2, degenerative changes with stenosis and moderate bilateral neural foraminal stenosis, L3-4 degenerative disc disease, facet changes with diffuse disc bulging and disc material abutting the L3 nerve root and the foraminal zone, L3 nerve root with mild stenosis, moderate bilateral neural foraminal stenosis, L4-5 degenerative disc disease, diffuse facet changes and diffuse disc changes throughout the lumbar spine.. The risks, benefits, and expectations of the procedure have been discussed and explained to the patient who was understanding and in agreement with suggested treatment plan. We will proceed with interventional treatment as discussed and explained to the patient who is willing to proceed with procedure as planned.   DESCRIPTION OF PROCEDURE: Lumbar epidural steroid injection with IV Versed, IV fentanyl conscious sedation, EKG, blood pressure, pulse, and pulse oximetry monitoring. The procedure was performed with the patient in the prone position under fluoroscopic guidance. A local anesthetic skin wheal of 1.5% plain lidocaine was accomplished at proposed entry site. An 18-gauge Tuohy epidural needle was inserted at the L 4 vertebral body level right of the midline via loss-of-resistance technique with negative heme and negative CSF return. A total of 40 mL of Preservative-Free normal saline with 40 mg of Kenalog injected incrementally via epidurally placed needle. Needle removed. The patient tolerated the injection well.   PLAN:   1. Medications: We will  continue presently prescribed medications. 2. Will consider modification of treatment regimen pending response to treatment rendered on today's visit and follow-up evaluation. 3. The patient is to follow-up with primary care physician regarding blood pressure and general medical condition status post lumbar epidural steroid injection performed on today's visit. 4. Surgical evaluation. 5. Neurological evaluation. 6. The patient may be a candidate for radiofrequency procedures, implantation device, and other treatment pending response to treatment and follow-up evaluation. 7. The patient has been advised to adhere to proper body mechanics and avoid activities which appear to aggravate condition. 8. The patient has been advised to call the Pain Management Center prior to scheduled return appointment should there be significant change in condition or should there be significant  1. Medications: We will continue presently prescribed medications.  2. Will consider modification of treatment regimen pending response to treatment rendered on today's visit and follow-up evaluation.  3. The patient is to follow-up with primary care physician regarding blood pressure and general medical condition status post lumbar epidural steroid injection performed on today's visit.  4. Surgical evaluation.  5. Neurological evaluation. 6. The patient may be a candidate for radiofrequency procedures, implantation device, and other treatment pending response to treatment and follow-up evaluation.  7. The patient has been advised to adhere to proper body mechanics and avoid activities which appear to aggravate condition.  8. The patient has been advised to call the Pain Management Center prior to scheduled return appointment should there be significant change in condition or should should patient have other concerns regarding condition prior to scheduled return appointment.  The patient is understanding and in agreement with  suggested treatment plan.   Review of Systems     Objective:   Physical  Exam        Assessment & Plan:

## 2015-03-31 NOTE — Progress Notes (Signed)
   Subjective:    Patient ID: Cathy MurdochPatricia A Foley, female    DOB: 12/03/1975, 39 y.o.   MRN: [id number]  HPI    Review of Systems     Objective:   Physical Exam        Assessment & Plan:

## 2015-03-31 NOTE — Progress Notes (Signed)
Safety precautions to be maintained throughout the outpatient stay will include: orient to surroundings, keep bed in low position, maintain call bell within reach at all times, provide assistance with transfer out of bed and ambulation.  

## 2015-03-31 NOTE — Patient Instructions (Addendum)
Continue present medications.  F/U PCP for evaliation of  BP and general medical  condition.  F/U surgical evaluation.  F/U neurological evaluation.  May consider radiofrequency rhizolysis or intraspinal procedures pending response to present treatment and F/U evaluation.  Patient to call Pain Management Center should patient have concerns prior to scheduled return appointmPain Management Discharge Instructions  General Discharge Instructions :  If you need to reach your doctor call: Monday-Friday 8:00 am - 4:00 pm at [phone] or toll free [phone].  After clinic hours [phone] to have operator reach doctor.  Bring all of your medication bottles to all your appointments in the pain clinic.  To cancel or reschedule your appointment with Pain Management please remember to call 24 hours in advance to avoid a fee.  Refer to the educational materials which you have been given on: General Risks, I had my Procedure. Discharge Instructions, Post Sedation.  Post Procedure Instructions:  The drugs you were given will stay in your system until tomorrow, so for the next 24 hours you should not drive, make any legal decisions or drink any alcoholic beverages.  You may eat anything you prefer, but it is better to start with liquids then soups and crackers, and gradually work up to solid foods.  Please notify your doctor immediately if you have any unusual bleeding, trouble breathing or pain that is not related to your normal pain.  Depending on the type of procedure that was done, some parts of your body may feel week and/or numb.  This usually clears up by tonight or the next day.  Walk with the use of an assistive device or accompanied by an adult for the 24 hours.  You may use ice on the affected area for the first 24 hours.  Put ice in a Ziploc bag and cover with a towel and place against area 15 minutes on 15 minutes off.  You may switch to heat after 24 hours.ent.

## 2015-04-01 ENCOUNTER — Telehealth: Payer: Self-pay | Admitting: *Deleted

## 2015-04-01 NOTE — Telephone Encounter (Signed)
F/U call done

## 2015-04-18 ENCOUNTER — Ambulatory Visit (INDEPENDENT_AMBULATORY_CARE_PROVIDER_SITE_OTHER): Payer: Medicaid Other | Admitting: Urology

## 2015-04-18 ENCOUNTER — Encounter: Payer: Self-pay | Admitting: Urology

## 2015-04-18 VITALS — BP 121/77 | HR 80 | Resp 18 | Ht 62.0 in | Wt 193.0 lb

## 2015-04-18 DIAGNOSIS — R350 Frequency of micturition: Secondary | ICD-10-CM | POA: Diagnosis not present

## 2015-04-18 DIAGNOSIS — R3129 Other microscopic hematuria: Secondary | ICD-10-CM

## 2015-04-18 DIAGNOSIS — Z87442 Personal history of urinary calculi: Secondary | ICD-10-CM

## 2015-04-18 DIAGNOSIS — R339 Retention of urine, unspecified: Secondary | ICD-10-CM | POA: Diagnosis not present

## 2015-04-18 DIAGNOSIS — N941 Unspecified dyspareunia: Secondary | ICD-10-CM

## 2015-04-18 DIAGNOSIS — R312 Other microscopic hematuria: Secondary | ICD-10-CM | POA: Diagnosis not present

## 2015-04-18 LAB — URINALYSIS, COMPLETE
Bilirubin, UA: NEGATIVE
Glucose, UA: NEGATIVE
Ketones, UA: NEGATIVE
Leukocytes, UA: NEGATIVE
NITRITE UA: NEGATIVE
PH UA: 6 (ref 5.0–7.5)
Protein, UA: NEGATIVE
Specific Gravity, UA: 1.025 (ref 1.005–1.030)
Urobilinogen, Ur: 0.2 mg/dL (ref 0.2–1.0)

## 2015-04-18 LAB — MICROSCOPIC EXAMINATION

## 2015-04-18 LAB — BLADDER SCAN AMB NON-IMAGING

## 2015-04-18 MED ORDER — SOLIFENACIN SUCCINATE 5 MG PO TABS
5.0000 mg | ORAL_TABLET | Freq: Every day | ORAL | Status: DC
Start: 2015-04-18 — End: 2015-07-09

## 2015-04-18 NOTE — Progress Notes (Signed)
04/18/2015 11:07 AM   [name] February 10, 1976 [id number]  Referring provider: Maryruth Hancock  Chief Complaint  Patient presents with  . Pelvic Pain    New patient    HPI: 39 yo F with history of urinary urgency, frequency, pelvic pain with intercourse, and bladder pain.    She complains today of urinary frequency, urgency as frequently as several times in an hour.  She feels as though she is not able to empty her bladder.  She gets 2-3 x nightly to void.  No gross hematuria or dysuria.  Her urinary complaints are longstanding.    She did take toviaz in the past prescribed by Dr. Achilles Dunk in 19-Mar-2013  which she says did not help.    She drinks at least 2- 20 oz cokes daily which she has cut down from more in the past.  She does not drink coffee.  She does not drink much water.    She was formally a patient of Dr. Achilles Dunk at [facility].  She underwent cystoscopy in 08/2013 which was unremarkable.   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  She had her right ovary removed in 20-Mar-2011 for enlarging ovarian cyst (benign) as well bilateral salpingectiony.    She is G2P2, s/p NSVD and c-section. 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    Recently episode pyelonephritis with assoicated fever but this is rare.  Rare UTIs.    She has passed a kidney stone in 03-19-2012.  She had ureteorscopy, LL in 2006/03/19.  No recent episodes of flank pain.     PMH: Past Medical History  Diagnosis Date  . Seizures     Surgical History: Past Surgical History  Procedure Laterality Date  . Tubal ligation    . Abdominal hysterectomy      Home Medications:    Medication List       This list is accurate as of: 04/18/15 11:59 PM.  Always use your most recent med list.               alprazolam 2 MG tablet  Commonly known as:  XANAX  Take 2 mg by mouth 4 (four) times daily as needed for  sleep.     amphetamine-dextroamphetamine 20 MG tablet  Commonly known as:  ADDERALL  Take 30 mg by mouth. 2.5 per day     diclofenac 50 MG tablet  Commonly known as:  CATAFLAM  1 tab by mouth per day or twice a day if tolerated     fesoterodine 8 MG Tb24 tablet  Commonly known as:  TOVIAZ  Take 8 mg by mouth daily.     fluticasone 220 MCG/ACT inhaler  Commonly known as:  FLOVENT HFA  Inhale 2 puffs into the lungs every 4 (four) hours as needed.     levothyroxine 150 MCG tablet  Commonly known as:  SYNTHROID, LEVOTHROID  Take 150 mcg by mouth. Every other day     levothyroxine 175 MCG tablet  Commonly known as:  SYNTHROID, LEVOTHROID  Take 175 mcg by mouth. Every other day     loratadine 10 MG tablet  Commonly known as:  CLARITIN  Take 10 mg by mouth daily.     omeprazole 40 MG capsule  Commonly known as:  PRILOSEC  Take 40 mg by mouth daily.     Oxycodone HCl  10 MG Tabs  1-2 tablets by mouth twice a day to 3 times a day if tolerated     solifenacin 5 MG tablet  Commonly known as:  VESICARE  Take 1 tablet (5 mg total) by mouth daily.        Allergies:  Allergies  Allergen Reactions  . Acetaminophen     Other reaction(s): Tachycardia (finding)  . Celecoxib Other (See Comments)  . Darvon [Propoxyphene] Hypertension    Heart race  . Gabapentin Rash    Family History: Family History  Problem Relation Age of Onset  . Alcohol abuse Mother   . Arthritis Mother   . Depression Mother   . Hearing loss Mother   . Alcohol abuse Father   . Arthritis Father   . COPD Father   . Depression Father   . Hearing loss Father   . Hyperlipidemia Father   . Hypertension Father     Social History:  reports that she has been smoking Cigarettes.  She has been smoking about 1.00 pack per day. She does not have any smokeless tobacco history on file. She reports that she drinks alcohol. She reports that she does not use illicit drugs.  ROS: Urological Symptom  Review  Patient is experiencing the following symptoms: Frequent urination Hard to postpone urination Get up at night to urinate Leakage of urine Stream starts and stops Trouble starting stream Urinary tract infection Painful intercourse Weak stream   Review of Systems  Gastrointestinal (upper)  : Nausea Vomiting Indigestion/heartburn  Gastrointestinal (lower) : Diarrhea Constipation  Constitutional : Fever Night Sweats Fatigue  Skin: Negative for skin symptoms  Eyes: Negative for eye symptoms  Ear/Nose/Throat : Sore throat Sinus problems  Hematologic/Lymphatic: Easy bruising  Cardiovascular : Leg swelling  Respiratory : Cough Shortness of breath  Endocrine: Excessive thirst  Musculoskeletal: Back pain Joint pain  Neurological: Headaches Dizziness  Psychologic: Depression Anxiety   Physical Exam: BP 121/77 mmHg  Pulse 80  Resp 18  Ht 5\' 2"  (1.575 m)  Wt 193 lb (87.544 kg)  BMI 35.29 kg/m2  Constitutional:  Alert and oriented, No acute distress. HEENT: [place] AT, moist mucus membranes.  Trachea midline, no masses. Cardiovascular: No clubbing, cyanosis, or edema. Respiratory: Normal respiratory effort, no increased work of breathing. GI: Abdomen is soft, nontender, nondistended, no abdominal masses GU: No CVA tenderness. Skin: No rashes, bruises or suspicious lesions. Lymph: No cervical or inguinal adenopathy. Neurologic: Grossly intact, no focal deficits, moving all 4 extremities. Psychiatric: Normal mood and affect.  Laboratory Data: Lab Results  Component Value Date   WBC 9.4 12/29/2014   HGB 14.4 12/29/2014   HCT 42.1 12/29/2014   MCV 89.6 12/29/2014   PLT 299 12/29/2014    Lab Results  Component Value Date   CREATININE 0.90 12/29/2014    Results for orders placed or performed in visit on 04/18/15  Microscopic Examination  Result Value Ref Range   WBC, UA 0-5 0 -  5 /hpf   RBC, UA 3-10 (A) 0 -  2 /hpf   Epithelial  Cells (non renal) 0-10 0 - 10 /hpf   Bacteria, UA Moderate (A) None seen/Few  Urinalysis, Complete  Result Value Ref Range   Specific Gravity, UA 1.025 1.005 - 1.030   pH, UA 6.0 5.0 - 7.5   Color, UA Yellow Yellow   Appearance Ur Hazy (A) Clear   Leukocytes, UA Negative Negative   Protein, UA Negative Negative/Trace   Glucose, UA Negative Negative  Ketones, UA Negative Negative   RBC, UA 1+ (A) Negative   Bilirubin, UA Negative Negative   Urobilinogen, Ur 0.2 0.2 - 1.0 mg/dL   Nitrite, UA Negative Negative   Microscopic Examination See below:   BLADDER SCAN AMB NON-IMAGING  Result Value Ref Range   Scan Result 21ml     Urinalysis Results for orders placed or performed in visit on 04/18/15  Microscopic Examination  Result Value Ref Range   WBC, UA 0-5 0 -  5 /hpf   RBC, UA 3-10 (A) 0 -  2 /hpf   Epithelial Cells (non renal) 0-10 0 - 10 /hpf   Bacteria, UA Moderate (A) None seen/Few  Urinalysis, Complete  Result Value Ref Range   Specific Gravity, UA 1.025 1.005 - 1.030   pH, UA 6.0 5.0 - 7.5   Color, UA Yellow Yellow   Appearance Ur Hazy (A) Clear   Leukocytes, UA Negative Negative   Protein, UA Negative Negative/Trace   Glucose, UA Negative Negative   Ketones, UA Negative Negative   RBC, UA 1+ (A) Negative   Bilirubin, UA Negative Negative   Urobilinogen, Ur 0.2 0.2 - 1.0 mg/dL   Nitrite, UA Negative Negative   Microscopic Examination See below:   BLADDER SCAN AMB NON-IMAGING  Result Value Ref Range   Scan Result 21ml     Pertinent Imaging: No recent imaging availible  Assessment & Plan:  39 year old female with long-standing urinary issues including urinary frequency, urgency, stress incontinence, nocturia as well as dyspareunia who presents today to establish care. She also has a history of kidney stones but no recent flank pain. Incidental microscopic hematuria on UA today 1.  1. Urinary frequency Today, we discussed the importance of behavioral  modification including avoidance of caffeinated and other irritating beverages which she drinks regularly.  She was urged to cut back on Coca-Cola and switched to water as her primary beverage of choice.  Will also start her on a trial of Vesicare 5 mg daily to see if this helps with her urinary urgency and frequency symptoms. We discussed common side effects of these medications including dry eyes, dry mouth, and constipation. Plan to have her return in 6 weeks for symptom review as well as PVR. - Urinalysis, Complete - solifenacin (VESICARE) 5 MG tablet; Take 1 tablet (5 mg total) by mouth daily.  Dispense: 30 tablet; Refill: 5 - Microscopic Examination  2. Incomplete bladder emptying Complains of sensation of incomplete bladder emptying. Postvoid residual today is minimal. - BLADDER SCAN AMB NON-IMAGING  3. Dyspareunia, female Chronic pelvic pain with deep penetration. We will address at next visit but may benefit from intravaginal Valium.  4. Microscopic hematuria Incidental microscopic hematuria 1 today.  She does not currently meet guidelines for microscopic hematuria. We'll recheck UA at next visit and proceed with workup if indicated.  5. History of kidney stones Remote history of kidney stones with no recent flank pain.  We'll defer imaging at this time pending need for possible microscopic hematuria workup in the near future.    Return in about 6 weeks (around 05/30/2015) for PVR, symptoms review.  Vanna Scotland, MD  [facility] [address] [place], [place] [address] [phone]

## 2015-05-01 ENCOUNTER — Encounter: Payer: Self-pay | Admitting: Pain Medicine

## 2015-05-01 ENCOUNTER — Ambulatory Visit: Payer: Medicaid Other | Attending: Pain Medicine | Admitting: Pain Medicine

## 2015-05-01 VITALS — BP 148/78 | HR 91 | Temp 97.7°F | Resp 16 | Ht 62.0 in | Wt 183.0 lb

## 2015-05-01 DIAGNOSIS — M51369 Other intervertebral disc degeneration, lumbar region without mention of lumbar back pain or lower extremity pain: Secondary | ICD-10-CM

## 2015-05-01 DIAGNOSIS — M170 Bilateral primary osteoarthritis of knee: Secondary | ICD-10-CM

## 2015-05-01 DIAGNOSIS — M47818 Spondylosis without myelopathy or radiculopathy, sacral and sacrococcygeal region: Secondary | ICD-10-CM

## 2015-05-01 DIAGNOSIS — M5136 Other intervertebral disc degeneration, lumbar region: Secondary | ICD-10-CM | POA: Diagnosis not present

## 2015-05-01 DIAGNOSIS — M79604 Pain in right leg: Secondary | ICD-10-CM | POA: Diagnosis present

## 2015-05-01 DIAGNOSIS — M961 Postlaminectomy syndrome, not elsewhere classified: Secondary | ICD-10-CM

## 2015-05-01 DIAGNOSIS — M5030 Other cervical disc degeneration, unspecified cervical region: Secondary | ICD-10-CM | POA: Diagnosis not present

## 2015-05-01 DIAGNOSIS — M533 Sacrococcygeal disorders, not elsewhere classified: Secondary | ICD-10-CM | POA: Diagnosis not present

## 2015-05-01 DIAGNOSIS — M47816 Spondylosis without myelopathy or radiculopathy, lumbar region: Secondary | ICD-10-CM | POA: Diagnosis not present

## 2015-05-01 DIAGNOSIS — M5481 Occipital neuralgia: Secondary | ICD-10-CM | POA: Diagnosis not present

## 2015-05-01 DIAGNOSIS — M545 Low back pain: Secondary | ICD-10-CM | POA: Diagnosis present

## 2015-05-01 DIAGNOSIS — M4806 Spinal stenosis, lumbar region: Secondary | ICD-10-CM | POA: Insufficient documentation

## 2015-05-01 DIAGNOSIS — M461 Sacroiliitis, not elsewhere classified: Secondary | ICD-10-CM

## 2015-05-01 DIAGNOSIS — M79605 Pain in left leg: Secondary | ICD-10-CM | POA: Diagnosis present

## 2015-05-01 DIAGNOSIS — R339 Retention of urine, unspecified: Secondary | ICD-10-CM

## 2015-05-01 MED ORDER — OXYCODONE HCL 10 MG PO TABS: ORAL_TABLET | ORAL | Status: DC

## 2015-05-01 NOTE — Patient Instructions (Addendum)
Smoking Cessation Quitting smoking is important to your health and has many advantages. However, it is not always easy to quit since nicotine is a very addictive drug. Oftentimes, people try 3 times or more before being able to quit. This document explains the best ways for you to prepare to quit smoking. Quitting takes hard work and a lot of effort, but you can do it. ADVANTAGES OF QUITTING SMOKING 1. You will live longer, feel better, and live better. 2. Your body will feel the impact of quitting smoking almost immediately. 1. Within 20 minutes, blood pressure decreases. Your pulse returns to its normal level. 2. After 8 hours, carbon monoxide levels in the blood return to normal. Your oxygen level increases. 3. After 24 hours, the chance of having a heart attack starts to decrease. Your breath, hair, and body stop smelling like smoke. 4. After 48 hours, damaged nerve endings begin to recover. Your sense of taste and smell improve. 5. After 72 hours, the body is virtually free of nicotine. Your bronchial tubes relax and breathing becomes easier. 6. After 2 to 12 weeks, lungs can hold more air. Exercise becomes easier and circulation improves. 3. The risk of having a heart attack, stroke, cancer, or lung disease is greatly reduced. 1. After 1 year, the risk of coronary heart disease is cut in half. 2. After 5 years, the risk of stroke falls to the same as a nonsmoker. 3. After 10 years, the risk of lung cancer is cut in half and the risk of other cancers decreases significantly. 4. After 15 years, the risk of coronary heart disease drops, usually to the level of a nonsmoker. 4. If you are pregnant, quitting smoking will improve your chances of having a healthy baby. 5. The people you live with, especially any children, will be healthier. 6. You will have extra money to spend on things other than cigarettes. QUESTIONS TO THINK ABOUT BEFORE ATTEMPTING TO QUIT You may want to talk about your  answers with your health care provider.  Why do you want to quit?  If you tried to quit in the past, what helped and what did not?  What will be the most difficult situations for you after you quit? How will you plan to handle them?  Who can help you through the tough times? Your family? Friends? A health care provider?  What pleasures do you get from smoking? What ways can you still get pleasure if you quit? Here are some questions to ask your health care provider:  How can you help me to be successful at quitting?  What medicine do you think would be best for me and how should I take it?  What should I do if I need more help?  What is smoking withdrawal like? How can I get information on withdrawal? GET READY  Set a quit date.  Change your environment by getting rid of all cigarettes, ashtrays, matches, and lighters in your home, car, or work. Do not let people smoke in your home.  Review your past attempts to quit. Think about what worked and what did not. GET SUPPORT AND ENCOURAGEMENT You have a better chance of being successful if you have help. You can get support in many ways. 1. Tell your family, friends, and coworkers that you are going to quit and need their support. Ask them not to smoke around you. 2. Get individual, group, or telephone counseling and support. Programs are available at [employer] and health centers. Call  your local health department for information about programs in your area. 3. Spiritual beliefs and practices may help some smokers quit. 4. Download a "quit meter" on your computer to keep track of quit statistics, such as how long you have gone without smoking, cigarettes not smoked, and money saved. 5. Get a self-help book about quitting smoking and staying off tobacco. LEARN NEW SKILLS AND BEHAVIORS  Distract yourself from urges to smoke. Talk to someone, go for a walk, or occupy your time with a task.  Change your normal routine. Take a  different route to work. Drink tea instead of coffee. Eat breakfast in a different place.  Reduce your stress. Take a hot bath, exercise, or read a book.  Plan something enjoyable to do every day. Reward yourself for not smoking.  Explore interactive web-based programs that specialize in helping you quit. GET MEDICINE AND USE IT CORRECTLY Medicines can help you stop smoking and decrease the urge to smoke. Combining medicine with the above behavioral methods and support can greatly increase your chances of successfully quitting smoking.  Nicotine replacement therapy helps deliver nicotine to your body without the negative effects and risks of smoking. Nicotine replacement therapy includes nicotine gum, lozenges, inhalers, nasal sprays, and skin patches. Some may be available over-the-counter and others require a prescription.  Antidepressant medicine helps people abstain from smoking, but how this works is unknown. This medicine is available by prescription.  Nicotinic receptor partial agonist medicine simulates the effect of nicotine in your brain. This medicine is available by prescription. Ask your health care provider for advice about which medicines to use and how to use them based on your health history. Your health care provider will tell you what side effects to look out for if you choose to be on a medicine or therapy. Carefully read the information on the package. Do not use any other product containing nicotine while using a nicotine replacement product.  RELAPSE OR DIFFICULT SITUATIONS Most relapses occur within the first 3 months after quitting. Do not be discouraged if you start smoking again. Remember, most people try several times before finally quitting. You may have symptoms of withdrawal because your body is used to nicotine. You may crave cigarettes, be irritable, feel very hungry, cough often, get headaches, or have difficulty concentrating. The withdrawal symptoms are only  temporary. They are strongest when you first quit, but they will go away within 10-14 days. To reduce the chances of relapse, try to:  Avoid drinking alcohol. Drinking lowers your chances of successfully quitting.  Reduce the amount of caffeine you consume. Once you quit smoking, the amount of caffeine in your body increases and can give you symptoms, such as a rapid heartbeat, sweating, and anxiety.  Avoid smokers because they can make you want to smoke.  Do not let weight gain distract you. Many smokers will gain weight when they quit, usually less than 10 pounds. Eat a healthy diet and stay active. You can always lose the weight gained after you quit.  Find ways to improve your mood other than smoking. FOR MORE INFORMATION  www.smokefree.gov  Document Released: 10/05/2001 Document Revised: 02/25/2014 Document Reviewed: 01/20/2012 [facility] Patient Information 2015 [place], [place]. This information is not intended to replace advice given to you by your health care provider. Make sure you discuss any questions you have with your health care provider.   Continue present medications diclofenac and oxycodone  Lumbar facet, medial branch nerve, blocks to be performed at time of return  appointment Monday, 05/12/2015  F/U PCP for evaliation of  BP and general medical  condition.  F/U surgical evaluation for evaluation of knees. Please get date of orthopedic appointment for evaluation of knees prior to discharge  F/U neurological evaluation  Neurosurgical reevaluation with Dr. Trey Sailors for evaluation of pain of neck as we discussed  May consider radiofrequency rhizolysis or intraspinal procedures pending response to present treatment and F/U evaluation.  Patient to call [facility] should patient have concerns prior to scheduled return appointment.  GENERAL RISKS AND COMPLICATIONS  What are the risk, side effects and possible complications? Generally speaking, most procedures  are safe.  However, with any procedure there are risks, side effects, and the possibility of complications.  The risks and complications are dependent upon the sites that are lesioned, or the type of nerve block to be performed.  The closer the procedure is to the spine, the more serious the risks are.  Great care is taken when placing the radio frequency needles, block needles or lesioning probes, but sometimes complications can occur. 1. Infection: Any time there is an injection through the skin, there is a risk of infection.  This is why sterile conditions are used for these blocks.  There are four possible types of infection. 1. Localized skin infection. 2. Central Nervous System Infection-This can be in the form of Meningitis, which can be deadly. 3. Epidural Infections-This can be in the form of an epidural abscess, which can cause pressure inside of the spine, causing compression of the spinal cord with subsequent paralysis. This would require an emergency surgery to decompress, and there are no guarantees that the patient would recover from the paralysis. 4. Discitis-This is an infection of the intervertebral discs.  It occurs in about 1% of discography procedures.  It is difficult to treat and it may lead to surgery.        2. Pain: the needles have to go through skin and soft tissues, will cause soreness.       3. Damage to internal structures:  The nerves to be lesioned may be near blood vessels or    other nerves which can be potentially damaged.       4. Bleeding: Bleeding is more common if the patient is taking blood thinners such as  aspirin, Coumadin, Ticiid, Plavix, etc., or if he/she have some genetic predisposition  such as hemophilia. Bleeding into the spinal canal can cause compression of the spinal  cord with subsequent paralysis.  This would require an emergency surgery to  decompress and there are no guarantees that the patient would recover from the  paralysis.        5. Pneumothorax:  Puncturing of a lung is a possibility, every time a needle is introduced in  the area of the chest or upper back.  Pneumothorax refers to free air around the  collapsed lung(s), inside of the thoracic cavity (chest cavity).  Another two possible  complications related to a similar event would include: Hemothorax and Chylothorax.   These are variations of the Pneumothorax, where instead of air around the collapsed  lung(s), you may have blood or chyle, respectively.       6. Spinal headaches: They may occur with any procedures in the area of the spine.       7. Persistent CSF (Cerebro-Spinal Fluid) leakage: This is a rare problem, but may occur  with prolonged intrathecal or epidural catheters either due to the formation of a fistulous  track or a dural tear.       8. Nerve damage: By working so close to the spinal cord, there is always a possibility of  nerve damage, which could be as serious as a permanent spinal cord injury with  paralysis.       9. Death:  Although rare, severe deadly allergic reactions known as "Anaphylactic  reaction" can occur to any of the medications used.      10. Worsening of the symptoms:  We can always make thing worse.  What are the chances of something like this happening? Chances of any of this occuring are extremely low.  By statistics, you have more of a chance of getting killed in a motor vehicle accident: while driving to the hospital than any of the above occurring .  Nevertheless, you should be aware that they are possibilities.  In general, it is similar to taking a shower.  Everybody knows that you can slip, hit your head and get killed.  Does that mean that you should not shower again?  Nevertheless always keep in mind that statistics do not mean anything if you happen to be on the wrong side of them.  Even if a procedure has a 1 (one) in a 1,000,000 (million) chance of going wrong, it you happen to be that one..Also, keep in mind that by statistics,  you have more of a chance of having something go wrong when taking medications.  Who should not have this procedure? If you are on a blood thinning medication (e.g. Coumadin, Plavix, see list of "Blood Thinners"), or if you have an active infection going on, you should not have the procedure.  If you are taking any blood thinners, please inform your physician.  How should I prepare for this procedure?  Do not eat or drink anything at least six hours prior to the procedure.  Bring a driver with you .  It cannot be a taxi.  Come accompanied by an adult that can drive you back, and that is strong enough to help you if your legs get weak or numb from the local anesthetic.  Take all of your medicines the morning of the procedure with just enough water to swallow them.  If you have diabetes, make sure that you are scheduled to have your procedure done first thing in the morning, whenever possible.  If you have diabetes, take only half of your insulin dose and notify our nurse that you have done so as soon as you arrive at the clinic.  If you are diabetic, but only take blood sugar pills (oral hypoglycemic), then do not take them on the morning of your procedure.  You may take them after you have had the procedure.  Do not take aspirin or any aspirin-containing medications, at least eleven (11) days prior to the procedure.  They may prolong bleeding.  Wear loose fitting clothing that may be easy to take off and that you would not mind if it got stained with Betadine or blood.  Do not wear any jewelry or perfume  Remove any nail coloring.  It will interfere with some of our monitoring equipment.  NOTE: Remember that this is not meant to be interpreted as a complete list of all possible complications.  Unforeseen problems may occur.  BLOOD THINNERS The following drugs contain aspirin or other products, which can cause increased bleeding during surgery and should not be taken for 2 weeks prior  to and 1 week after surgery.  If you should need take something for relief of minor pain, you may take acetaminophen which is found in Tylenol,m Datril, Anacin-3 and Panadol. It is not blood thinner. The products listed below are.  Do not take any of the products listed below in addition to any listed on your instruction sheet.  A.P.C or A.P.C with Codeine Codeine Phosphate Capsules #3 Ibuprofen Ridaura  ABC compound Congesprin Imuran rimadil  Advil Cope Indocin Robaxisal  Alka-Seltzer Effervescent Pain Reliever and Antacid Coricidin or Coricidin-D  Indomethacin Rufen  Alka-Seltzer plus Cold Medicine Cosprin Ketoprofen S-A-C Tablets  Anacin Analgesic Tablets or Capsules Coumadin Korlgesic Salflex  Anacin Extra Strength Analgesic tablets or capsules CP-2 Tablets Lanoril Salicylate  Anaprox Cuprimine Capsules Levenox Salocol  Anexsia-D Dalteparin Magan Salsalate  Anodynos Darvon compound Magnesium Salicylate Sine-off  Ansaid Dasin Capsules Magsal Sodium Salicylate  Anturane Depen Capsules Marnal Soma  APF Arthritis pain formula Dewitt's Pills Measurin Stanback  Argesic Dia-Gesic Meclofenamic Sulfinpyrazone  Arthritis Bayer Timed Release Aspirin Diclofenac Meclomen Sulindac  Arthritis pain formula Anacin Dicumarol Medipren Supac  Analgesic (Safety coated) Arthralgen Diffunasal Mefanamic Suprofen  Arthritis Strength Bufferin Dihydrocodeine Mepro Compound Suprol  Arthropan liquid Dopirydamole Methcarbomol with Aspirin Synalgos  ASA tablets/Enseals Disalcid Micrainin Tagament  Ascriptin Doan's Midol Talwin  Ascriptin A/D Dolene Mobidin Tanderil  Ascriptin Extra Strength Dolobid Moblgesic Ticlid  Ascriptin with Codeine Doloprin or Doloprin with Codeine Momentum Tolectin  Asperbuf Duoprin Mono-gesic Trendar  Aspergum Duradyne Motrin or Motrin IB Triminicin  Aspirin plain, buffered or enteric coated Durasal Myochrisine Trigesic  Aspirin Suppositories Easprin Nalfon Trillsate  Aspirin with  Codeine Ecotrin Regular or Extra Strength Naprosyn Uracel  Atromid-S Efficin Naproxen Ursinus  Auranofin Capsules Elmiron Neocylate Vanquish  Axotal Emagrin Norgesic Verin  Azathioprine Empirin or Empirin with Codeine Normiflo Vitamin E  Azolid Emprazil Nuprin Voltaren  Bayer Aspirin plain, buffered or children's or timed BC Tablets or powders Encaprin Orgaran Warfarin Sodium  Buff-a-Comp Enoxaparin Orudis Zorpin  Buff-a-Comp with Codeine Equegesic Os-Cal-Gesic   Buffaprin Excedrin plain, buffered or Extra Strength Oxalid   Bufferin Arthritis Strength Feldene Oxphenbutazone   Bufferin plain or Extra Strength Feldene Capsules Oxycodone with Aspirin   Bufferin with Codeine Fenoprofen Fenoprofen Pabalate or Pabalate-SF   Buffets II Flogesic Panagesic   Buffinol plain or Extra Strength Florinal or Florinal with Codeine Panwarfarin   Buf-Tabs Flurbiprofen Penicillamine   Butalbital Compound Four-way cold tablets Penicillin   Butazolidin Fragmin Pepto-Bismol   Carbenicillin Geminisyn Percodan   Carna Arthritis Reliever Geopen Persantine   Carprofen Gold's salt Persistin   Chloramphenicol Goody's Phenylbutazone   Chloromycetin Haltrain Piroxlcam   Clmetidine heparin Plaquenil   Cllnoril Hyco-pap Ponstel   Clofibrate Hydroxy chloroquine Propoxyphen         Before stopping any of these medications, be sure to consult the physician who ordered them.  Some, such as Coumadin (Warfarin) are ordered to prevent or treat serious conditions such as "deep thrombosis", "pumonary embolisms", and other heart problems.  The amount of time that you may need off of the medication may also vary with the medication and the reason for which you were taking it.  If you are taking any of these medications, please make sure you notify your pain physician before you undergo any procedures.         Facet Joint Block The facet joints connect the bones of the spine (vertebrae). They make it possible for you to  bend, twist, and make other movements with your spine. They also prevent you from  overbending, overtwisting, and making other excessive movements.  A facet joint block is a procedure where a numbing medicine (anesthetic) is injected into a facet joint. Often, a type of anti-inflammatory medicine called a steroid is also injected. A facet joint block may be done for two reasons:  7. Diagnosis. A facet joint block may be done as a test to see whether neck or back pain is caused by a worn-down or infected facet joint. If the pain gets better after a facet joint block, it means the pain is probably coming from the facet joint. If the pain does not get better, it means the pain is probably not coming from the facet joint.  8. Therapy. A facet joint block may be done to relieve neck or back pain caused by a facet joint. A facet joint block is only done as a therapy if the pain does not improve with medicine, exercise programs, physical therapy, and other forms of pain management. LET [facility] CARE PROVIDER KNOW ABOUT:   Any allergies you have.   All medicines you are taking, including vitamins, herbs, eyedrops, and over-the-counter medicines and creams.   Previous problems you or members of your family have had with the use of anesthetics.   Any blood disorders you have had.   Other health problems you have. RISKS AND COMPLICATIONS Generally, having a facet joint block is safe. However, as with any procedure, complications can occur. Possible complications associated with having a facet joint block include:   Bleeding.   Injury to a nerve near the injection site.   Pain at the injection site.   Weakness or numbness in areas controlled by nerves near the injection site.   Infection.   Temporary fluid retention.   Allergic reaction to anesthetics or medicines used during the procedure. BEFORE THE PROCEDURE   Follow your health care provider's instructions if you are taking  dietary supplements or medicines. You may need to stop taking them or reduce your dosage.   Do not take any new dietary supplements or medicines without asking your health care provider first.   Follow your health care provider's instructions about eating and drinking before the procedure. You may need to stop eating and drinking several hours before the procedure.   Arrange to have an adult drive you home after the procedure. PROCEDURE 12. You may need to remove your clothing and dress in an open-back gown so that your health care provider can access your spine.  13. The procedure will be done while you are lying on an X-ray table. Most of the time you will be asked to lie on your stomach, but you may be asked to lie in a different position if an injection will be made in your neck.  14. Special machines will be used to monitor your oxygen levels, heart rate, and blood pressure.  15. If an injection will be made in your neck, an intravenous (IV) tube will be inserted into one of your veins. Fluids and medicine will flow directly into your body through the IV tube.  16. The area over the facet joint where the injection will be made will be cleaned with an antiseptic soap. The surrounding skin will be covered with sterile drapes.  17. An anesthetic will be applied to your skin to make the injection area numb. You may feel a temporary stinging or burning sensation.  18. A video X-ray machine will be used to locate the joint. A contrast dye may be injected  into the facet joint area to help with locating the joint.  19. When the joint is located, an anesthetic medicine will be injected into the joint through the needle.  20. Your health care provider will ask you whether you feel pain relief. If you do feel relief, a steroid may be injected to provide pain relief for a longer period of time. If you do not feel relief or feel only partial relief, additional injections of an anesthetic may be  made in other facet joints.  21. The needle will be removed, the skin will be cleansed, and bandages will be applied.  AFTER THE PROCEDURE   You will be observed for 15-30 minutes before being allowed to go home. Do not drive. Have an adult drive you or take a taxi or public transportation instead.   If you feel pain relief, the pain will return in several hours or days when the anesthetic wears off.   You may feel pain relief 2-14 days after the procedure. The amount of time this relief lasts varies from person to person.   It is normal to feel some tenderness over the injected area(s) for 2 days following the procedure.   If you have diabetes, you may have a temporary increase in blood sugar. Document Released: 03/02/2007 Document Revised: 02/25/2014 Document Reviewed: 07/31/2012 [facility] Patient Information 2015 [place], [place]. This information is not intended to replace advice given to you by your health care provider. Make sure you discuss any questions you have with your health care provider.

## 2015-05-01 NOTE — Progress Notes (Signed)
Discharged to home ambulatory with script in hand for oxycodone.  Pre procedure instructions given with teach back 3 done. 

## 2015-05-01 NOTE — Progress Notes (Signed)
Safety precautions to be maintained throughout the outpatient stay will include: orient to surroundings, keep bed in low position, maintain call bell within reach at all times, provide assistance with transfer out of bed and ambulation.  

## 2015-05-01 NOTE — Progress Notes (Signed)
   Subjective:    Patient ID: Cathy MurdochPatricia A Foley, female    DOB: 09/07/1976, 39 y.o.   MRN: [id number]  HPI   Patient is 39 year old female [facility] evaluation and treatment of pain involving the region of the lower back and lower extremity region predominantly patient states that her pain is aggravated by twisting and turning maneuvers especially in the pain is aggravated by turning over in bed. We will proceed with lumbar facet, medial branch nerve, blocks at time return appointment in an attempt to decrease the patient's symptoms. The patient also has pain involving the region of the neck and we will have patient follow-up with neurosurgeon Dr. Trey SailorsMark Roy in this regard at this time. Patient is with prior cervical MRI. We will await surgical disposition of Dr. Channing Muttersoy regarding pain of the cervical region and we will proceed with interventional treatment for the severe lumbar and lower extremity pain. The patient is thin agreement with suggested treatment plan      Review of Systems     Objective:   Physical Exam  There was tenderness of the knees And occipitalis musculature region of moderate degree. Palpation over the region of the cervical facet cervical paraspinal musculature region was with moderate discomfort. No definite abnormalities noted with Spurling's maneuver. Patient appeared to be with slightly decreased grip strength. Tinel and Phalen's maneuver without increased pain of significant degree. Palpation over the thoracic facet thoracic paraspinal musculature region was evidence of muscle spasm of moderate degree. Palpation over the lumbar paraspinal muscles lumbar facet region was with moderate moderately severe discomfort. There was severe tenderness over the PSIS and PII S regions. Moderate tenderness of the gluteal and piriformis muscular regions. Straight leg raising limited to approximately 20 without increased pain dorsiflexion noted. No sensory  deficit of dermatomal dystrophy detected. 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 Rotation and lateral bending and extension and palpation of the lumbar facets reproduce severely disabling pain. Abdomen was nontender with no costovertebral angle tenderness noted.       Assessment & Plan:   Degenerative disc disease lumbar spine Status post prior hemilaminectomy L2, degenerative changes lumbar spine with stenosis and moderate bilateral foraminal stenosis, L3-4 degenerative disc disease and facet changes with diffuse disc bulging and disc material abutting the L3 nerve root in the foraminal zone, L4-5 degenerative disc disease, diffuse facet changes noted throughout the lumbar spine  Lumbar facet syndrome  Sacroiliac joint dysfunction  Degenerative disc disease cervical spine  Cervical facet syndrome  Bilateral occipital neuralgia  Degenerative joint disease of knees   Plan   Continue present medications diclofenac and oxycodone  Cervical facet, medial branch nerve blocks to be performed at time return appointment as discussed  F/U PCP for evaliation of  BP and general medical  condition.  F/U surgical evaluation with Dr. Trey SailorsMark Roy as planned  F/U neurological evaluation  May consider radiofrequency rhizolysis or intraspinal procedures pending response to present treatment and F/U evaluation.  Patient to call [facility] should patient have concerns prior to scheduled return appointment.

## 2015-05-12 ENCOUNTER — Ambulatory Visit: Payer: Medicaid Other | Attending: Pain Medicine | Admitting: Pain Medicine

## 2015-05-12 ENCOUNTER — Encounter: Payer: Self-pay | Admitting: Pain Medicine

## 2015-05-12 VITALS — BP 100/58 | HR 48 | Temp 98.1°F | Resp 16 | Ht 62.0 in | Wt 185.0 lb

## 2015-05-12 DIAGNOSIS — M79605 Pain in left leg: Secondary | ICD-10-CM | POA: Diagnosis present

## 2015-05-12 DIAGNOSIS — M170 Bilateral primary osteoarthritis of knee: Secondary | ICD-10-CM

## 2015-05-12 DIAGNOSIS — M5136 Other intervertebral disc degeneration, lumbar region: Secondary | ICD-10-CM | POA: Diagnosis not present

## 2015-05-12 DIAGNOSIS — M47818 Spondylosis without myelopathy or radiculopathy, sacral and sacrococcygeal region: Secondary | ICD-10-CM

## 2015-05-12 DIAGNOSIS — M545 Low back pain: Secondary | ICD-10-CM | POA: Diagnosis present

## 2015-05-12 DIAGNOSIS — M5126 Other intervertebral disc displacement, lumbar region: Secondary | ICD-10-CM | POA: Diagnosis not present

## 2015-05-12 DIAGNOSIS — M461 Sacroiliitis, not elsewhere classified: Secondary | ICD-10-CM

## 2015-05-12 DIAGNOSIS — M4806 Spinal stenosis, lumbar region: Secondary | ICD-10-CM | POA: Insufficient documentation

## 2015-05-12 DIAGNOSIS — Z9889 Other specified postprocedural states: Secondary | ICD-10-CM | POA: Insufficient documentation

## 2015-05-12 DIAGNOSIS — M79604 Pain in right leg: Secondary | ICD-10-CM | POA: Diagnosis present

## 2015-05-12 DIAGNOSIS — M961 Postlaminectomy syndrome, not elsewhere classified: Secondary | ICD-10-CM

## 2015-05-12 DIAGNOSIS — M47816 Spondylosis without myelopathy or radiculopathy, lumbar region: Secondary | ICD-10-CM | POA: Insufficient documentation

## 2015-05-12 DIAGNOSIS — M5030 Other cervical disc degeneration, unspecified cervical region: Secondary | ICD-10-CM

## 2015-05-12 DIAGNOSIS — M5481 Occipital neuralgia: Secondary | ICD-10-CM

## 2015-05-12 MED ORDER — FENTANYL CITRATE (PF) 100 MCG/2ML IJ SOLN
INTRAMUSCULAR | Status: AC
  Administered 2015-05-12: 100 ug via INTRAVENOUS
  Filled 2015-05-12: qty 2

## 2015-05-12 MED ORDER — BUPIVACAINE HCL (PF) 0.25 % IJ SOLN
INTRAMUSCULAR | Status: AC
  Administered 2015-05-12: 14:00:00
  Filled 2015-05-12: qty 30

## 2015-05-12 MED ORDER — TRIAMCINOLONE ACETONIDE 40 MG/ML IJ SUSP
INTRAMUSCULAR | Status: AC
  Administered 2015-05-12: 14:00:00
  Filled 2015-05-12: qty 1

## 2015-05-12 MED ORDER — MIDAZOLAM HCL 5 MG/5ML IJ SOLN
INTRAMUSCULAR | Status: AC
  Filled 2015-05-12: qty 5

## 2015-05-12 NOTE — Progress Notes (Signed)
Safety precautions to be maintained throughout the outpatient stay will include: orient to surroundings, keep bed in low position, maintain call bell within reach at all times, provide assistance with transfer out of bed and ambulation.  

## 2015-05-12 NOTE — Patient Instructions (Addendum)
Continue present medications diclofenac and oxycodone  F/U PCP Dr.Patel   for evaliation of  BP and general medical  condition.  F/U surgical evaluation  F/U neurological evaluation  F/U psych evaluation as discussed  May consider radiofrequency rhizolysis or intraspinal procedures pending response to present treatment and F/U evaluation.  Patient to call [facility] should patient have concerns prior to scheduled return appointment. Pain Management Discharge Instructions  General Discharge Instructions :  If you need to reach your doctor call: Monday-Friday 8:00 am - 4:00 pm at [phone] or toll free [phone].  After clinic hours [phone] to have operator reach doctor.  Bring all of your medication bottles to all your appointments in the pain clinic.  To cancel or reschedule your appointment with Pain Management please remember to call 24 hours in advance to avoid a fee.  Refer to the educational materials which you have been given on: General Risks, I had my Procedure. Discharge Instructions, Post Sedation.  Post Procedure Instructions:  The drugs you were given will stay in your system until tomorrow, so for the next 24 hours you should not drive, make any legal decisions or drink any alcoholic beverages.  You may eat anything you prefer, but it is better to start with liquids then soups and crackers, and gradually work up to solid foods.  Please notify your doctor immediately if you have any unusual bleeding, trouble breathing or pain that is not related to your normal pain.  Depending on the type of procedure that was done, some parts of your body may feel week and/or numb.  This usually clears up by tonight or the next day.  Walk with the use of an assistive device or accompanied by an adult for the 24 hours.  You may use ice on the affected area for the first 24 hours.  Put ice in a Ziploc bag and cover with a towel and place against area 15 minutes  on 15 minutes off.  You may switch to heat after 24 hours.Facet Joint Block The facet joints connect the bones of the spine (vertebrae). They make it possible for you to bend, twist, and make other movements with your spine. They also prevent you from overbending, overtwisting, and making other excessive movements.  A facet joint block is a procedure where a numbing medicine (anesthetic) is injected into a facet joint. Often, a type of anti-inflammatory medicine called a steroid is also injected. A facet joint block may be done for two reasons:   Diagnosis. A facet joint block may be done as a test to see whether neck or back pain is caused by a worn-down or infected facet joint. If the pain gets better after a facet joint block, it means the pain is probably coming from the facet joint. If the pain does not get better, it means the pain is probably not coming from the facet joint.   Therapy. A facet joint block may be done to relieve neck or back pain caused by a facet joint. A facet joint block is only done as a therapy if the pain does not improve with medicine, exercise programs, physical therapy, and other forms of pain management. LET [facility] CARE PROVIDER KNOW ABOUT:   Any allergies you have.   All medicines you are taking, including vitamins, herbs, eyedrops, and over-the-counter medicines and creams.   Previous problems you or members of your family have had with the use of anesthetics.   Any blood disorders you have  had.   Other health problems you have. RISKS AND COMPLICATIONS Generally, having a facet joint block is safe. However, as with any procedure, complications can occur. Possible complications associated with having a facet joint block include:   Bleeding.   Injury to a nerve near the injection site.   Pain at the injection site.   Weakness or numbness in areas controlled by nerves near the injection site.   Infection.   Temporary fluid retention.    Allergic reaction to anesthetics or medicines used during the procedure. BEFORE THE PROCEDURE   Follow your health care provider's instructions if you are taking dietary supplements or medicines. You may need to stop taking them or reduce your dosage.   Do not take any new dietary supplements or medicines without asking your health care provider first.   Follow your health care provider's instructions about eating and drinking before the procedure. You may need to stop eating and drinking several hours before the procedure.   Arrange to have an adult drive you home after the procedure. PROCEDURE  You may need to remove your clothing and dress in an open-back gown so that your health care provider can access your spine.   The procedure will be done while you are lying on an X-ray table. Most of the time you will be asked to lie on your stomach, but you may be asked to lie in a different position if an injection will be made in your neck.   Special machines will be used to monitor your oxygen levels, heart rate, and blood pressure.   If an injection will be made in your neck, an intravenous (IV) tube will be inserted into one of your veins. Fluids and medicine will flow directly into your body through the IV tube.   The area over the facet joint where the injection will be made will be cleaned with an antiseptic soap. The surrounding skin will be covered with sterile drapes.   An anesthetic will be applied to your skin to make the injection area numb. You may feel a temporary stinging or burning sensation.   A video X-ray machine will be used to locate the joint. A contrast dye may be injected into the facet joint area to help with locating the joint.   When the joint is located, an anesthetic medicine will be injected into the joint through the needle.   Your health care provider will ask you whether you feel pain relief. If you do feel relief, a steroid may be injected to  provide pain relief for a longer period of time. If you do not feel relief or feel only partial relief, additional injections of an anesthetic may be made in other facet joints.   The needle will be removed, the skin will be cleansed, and bandages will be applied.  AFTER THE PROCEDURE   You will be observed for 15-30 minutes before being allowed to go home. Do not drive. Have an adult drive you or take a taxi or public transportation instead.   If you feel pain relief, the pain will return in several hours or days when the anesthetic wears off.   You may feel pain relief 2-14 days after the procedure. The amount of time this relief lasts varies from person to person.   It is normal to feel some tenderness over the injected area(s) for 2 days following the procedure.   If you have diabetes, you may have a temporary increase in blood sugar.  Document Released: 03/02/2007 Document Revised: 02/25/2014 Document Reviewed: 07/31/2012 [facility] Patient Information 2015 [place], [place]. This information is not intended to replace advice given to you by your health care provider. Make sure you discuss any questions you have with your health care provider.

## 2015-05-12 NOTE — Progress Notes (Signed)
Subjective:    Patient ID: [name], female    DOB: 1976/02/16, 39 y.o.   MRN: [id number]  HPI  PROCEDURE PERFORMED: Lumbar facet (medial branch block)   NOTE: The patient is a 39 y.o. female who [facility] for further evaluation and treatment of pain involving the lumbar and lower extremity region. MRI  revealed the patient to be with evidence of multilevel degenerative changes lumbar spine with prior surgical intervention of the lumbar region with postoperative changes L2 from hemilaminectomy, degenerative changes with stenosis, bilateral neural foraminal stenosis, L3-4 degenerative disc disease and facet changes and diffuse disc bulging and disc material abutting the L3 nerve root and the foraminal zone, L4-5 degenerative disc disease. There is concern regarding significant component of patient's pain being due to facet arthropathy with facet syndrome.. The risks, benefits, and expectations of the procedure have been discussed and explained to the patient who was understanding and in agreement with suggested treatment plan. We will proceed with interventional treatment as discussed and as explained to the patient who was understanding and wished to proceed with procedure as planned.   DESCRIPTION OF PROCEDURE: Lumbar facet (medial branch block) with IV Versed, IV fentanyl conscious sedation, EKG, blood pressure, pulse, and pulse oximetry monitoring. The procedure was performed with the patient in the prone position. Betadine prep of proposed entry site performed.   NEEDLE PLACEMENT AT: Left L 3 lumbar facet (medial branch block). Under fluoroscopic guidance with oblique orientation of 15 degrees, a 22-gauge needle was inserted at the L 3 vertebral body level with needle placed at the targeted area of Burton's Eye or Eye of the [place] with documentation of needle placement in the superior and lateral border of targeted area of Burton's Eye or Eye of the Scotty Dog  with oblique orientation of 15 degrees. Following documentation of needle placement at the L 3 vertebral body level, needle placement was then accomplished at the L4 vertebral body level.   NEEDLE PLACEMENT AT  L4 AND L5 VERTEBRAL BODY LEVELS ON THE LEFT SIDE The procedure was performed at tL4 and L5rtebral body levels exactly as was performed at t3vertebral body level utilizing the same technique and under fluoroscopic guidance.  NEEDLE PLACEMENT AT THE SACRAL ALA with AP view of the lumbosacral spine. With the patient in the prone position, Betadine prep of proposed entry site accomplished, a 22 gauge needle was inserted in the region of the sacral ala (groove formed by the superior articulating process of S1 and the sacral wing). Following documentation of needle placement at the sacral ala,  needle placement was then accomplished at the S1 foramen level.   NEEDLE PLACEMENT AT THE S1 FORAMEN LEVEL under fluoroscopic guidance with AP view of the lumbosacral spine and cephalad orientation of the fluoroscope, a 22-gauge needle was placed at the superior and lateral border of the S1 foramen under fluoroscopic guidance. Following documentation of needle placement at the S1 foramen.   Needle placement was then verified at all levels on lateral view. Following documentation of needle placement at all levels on lateral view and following negative aspiration for heme and CSF, each level was injected with 1 mL of 0.25% bupivacaine with Kenalog.     LUMBAR FACET, MEDIAL BRANCH NERVE, BLOCKS PERFORMED ON THE RIGHT SIDE   The procedure was performed on the right side exactly as was performed on the left side at the same levels and utilizing the same technique under fluoroscopic guidance.  The patient tolerated the procedure well. A total of 40 mg of Kenalog was utilized for the procedure.   PLAN:  1. Medications: The patient will continue presently prescribed medications. diclofenac and oxycodone   2. May consider modification of treatment regimen at time of return appointment pending response to treatment rendered on today's visit. 3. The patient is to follow-up with primary care physician [name]  for further evaluation of blood pressure and general medical condition status post steroid injection performed on today's visit. 4. Surgical follow-up evaluation. 5. Neurological follow-up evaluation as well as follow-up psych evaluation as discussed  6. The patient may be candidate for radiofrequency procedures, implantation type procedures, and other treatment pending response to treatment and follow-up evaluation. 7. The patient has been advised to call the [facility] prior to scheduled return appointment should there be significant change in condition or should patient have other concerns regarding condition prior to scheduled return appointment.  The patient is understanding and in agreement with suggested treatment plan.      Review of Systems     Objective:   Physical Exam        Assessment & Plan:

## 2015-05-13 ENCOUNTER — Telehealth: Payer: Self-pay | Admitting: *Deleted

## 2015-05-13 NOTE — Telephone Encounter (Signed)
No problems 

## 2015-05-30 ENCOUNTER — Ambulatory Visit: Payer: Medicaid Other | Admitting: Urology

## 2015-06-02 ENCOUNTER — Ambulatory Visit: Payer: Medicaid Other | Admitting: Urology

## 2015-06-03 ENCOUNTER — Encounter: Payer: Self-pay | Admitting: Pain Medicine

## 2015-06-03 ENCOUNTER — Ambulatory Visit: Payer: Medicaid Other | Attending: Pain Medicine | Admitting: Pain Medicine

## 2015-06-03 VITALS — BP 123/76 | HR 98 | Temp 97.4°F | Resp 18 | Ht 62.0 in | Wt 182.0 lb

## 2015-06-03 DIAGNOSIS — M5136 Other intervertebral disc degeneration, lumbar region: Secondary | ICD-10-CM | POA: Diagnosis not present

## 2015-06-03 DIAGNOSIS — M5030 Other cervical disc degeneration, unspecified cervical region: Secondary | ICD-10-CM

## 2015-06-03 DIAGNOSIS — M461 Sacroiliitis, not elsewhere classified: Secondary | ICD-10-CM

## 2015-06-03 DIAGNOSIS — M47816 Spondylosis without myelopathy or radiculopathy, lumbar region: Secondary | ICD-10-CM | POA: Insufficient documentation

## 2015-06-03 DIAGNOSIS — M4126 Other idiopathic scoliosis, lumbar region: Secondary | ICD-10-CM | POA: Diagnosis not present

## 2015-06-03 DIAGNOSIS — M5481 Occipital neuralgia: Secondary | ICD-10-CM | POA: Diagnosis not present

## 2015-06-03 DIAGNOSIS — R51 Headache: Secondary | ICD-10-CM | POA: Diagnosis present

## 2015-06-03 DIAGNOSIS — Z9889 Other specified postprocedural states: Secondary | ICD-10-CM | POA: Insufficient documentation

## 2015-06-03 DIAGNOSIS — M961 Postlaminectomy syndrome, not elsewhere classified: Secondary | ICD-10-CM

## 2015-06-03 DIAGNOSIS — M533 Sacrococcygeal disorders, not elsewhere classified: Secondary | ICD-10-CM | POA: Diagnosis not present

## 2015-06-03 DIAGNOSIS — M170 Bilateral primary osteoarthritis of knee: Secondary | ICD-10-CM

## 2015-06-03 DIAGNOSIS — M542 Cervicalgia: Secondary | ICD-10-CM | POA: Diagnosis present

## 2015-06-03 DIAGNOSIS — M47818 Spondylosis without myelopathy or radiculopathy, sacral and sacrococcygeal region: Secondary | ICD-10-CM

## 2015-06-03 MED ORDER — OXYCODONE HCL 10 MG PO TABS: ORAL_TABLET | ORAL | Status: DC

## 2015-06-03 NOTE — Progress Notes (Signed)
Subjective:    Patient ID: [name], female    DOB: 03-Sep-1976, 38 y.o.   MRN: [id number]  HPI  Patient is 39 year old female who [facility] for further evaluation and treatment of pain involving the region of the neck with headaches as well as pain involving the upper mid and lower back and lower extremity regions. Patient is undergone orthopedic evaluation including evaluation of the knee and has been recommended to wear brace of the knee at this time. Patient states that her lower back lower extremity pain is improved with prior treatment and that she has pain radiating across the back in the region of the buttocks aggravated by standing walking more intense as the day progresses. We discussed patient's condition and will continue presently prescribed medications and will consider patient for interventional treatment at time of return appointment. There is concern regarding significant component of pain due to sacroiliac joint dysfunction. We will proceed with block of nerves to the sacroiliac joint in attempt to decrease severity of patient's symptoms, minimize progression of patient's symptoms, and avoid the need for more involved treatment. The patient was with understanding and in agreement with suggested treatment plan.      Review of Systems     Objective:   Physical Exam  There was tends to palpation of the splenius capitis and occipitalis musculature region. Palpation of this region reproduced pain of moderate degree. There was tenderness of the cervical facet cervical paraspinal musculature region as well as the thoracic facet thoracic paraspinal musculature region with no crepitus of the thoracic region noted. There appeared to be unremarkable Spurling's maneuver. Patient was with slightly decreased grip strength. Tinel and Phalen's maneuver were without increase of pain of significant degree. Palpation over the region of the lower thoracic paraspinal  muscles region thoracic facet region was a significant muscle spasms. Palpation over the lumbar paraspinal musculature region lumbar facet region was with moderately severe tenderness to palpation extension and palpation over the lumbar facets reproducing moderate moderately severe discomfort. There was severe tenderness of the PSIS PII S region as well as the gluteal and piriformis musculature regions. There was mild tenderness of the greater trochanteric region iliotibial band region. Straight leg raising was tolerates approximately 30 without a definite increased pain with dorsiflexion noted. There was negative clonus negative Homans. Outpatient over the PSIS and PII S regions reproduced severe disabling pain. There was crepitus of the knees noted with range of motion maneuvers of the needle reproducing pain of moderate degree. There was negative anterior and posterior drawer signs and no ballottement of the patella. No increased warmth or erythema of the knee was noted. There was negative clonus negative Homans. Abdomen was nontender and no costovertebral angle tenderness was noted.         Assessment & Plan:    Degenerative disc disease lumbar spine Status post prior hemilaminectomy L2, degenerative changes lumbar spine with stenosis and moderate bilateral foraminal stenosis, L3-4 degenerative disc disease and facet changes with diffuse disc bulging and disc material abutting the L3 nerve root in the foraminal zone, L4-5 degenerative disc disease, diffuse facet changes noted throughout the lumbar spine  Lumbar facet syndrome  Sacroiliac joint dysfunction  Degenerative disc disease cervical spine  Cervical facet syndrome  Bilateral occipital neuralgia   Plan  Continue present medication diclofenac Robaxin and oxycodone  Block of nerves to the sacroiliac joint to be performed at time of return appointment  F/U PCP Dr. Allena Katz for evaliation  of  BP and general medical  condition  F/U  surgical evaluation the patient will be considered for further neurosurgical evaluation as discussed  F/U orthopedic evaluation to discuss use of brace for knee  F/U psych evaluation as planned  F/U neurological evaluation  May consider radiofrequency rhizolysis or intraspinal procedures pending response to present treatment and F/U evaluation   Patient to call [facility] should patient have concerns prior to scheduled return appointmen.

## 2015-06-03 NOTE — Patient Instructions (Addendum)
Continue present medication oxycodone and diclofenac  Block of nerves to sacroiliac joint to be performed at time of return appointment  F/U PCP Dr. Allena Katz for evaliation of  BP and general medical  condition  F/U surgical evaluation . Please obtain brace for knee from orthopedic surgeon as discussed . Neurosurgical reevaluation as discussed  F/U neurological evaluation  May consider radiofrequency rhizolysis or intraspinal procedures pending response to present treatment and F/U evaluation   Patient to call [facility] should patient have concerns prior to scheduled return appointmen. Sacroiliac (SI) Joint Injection Patient Information  Description: The sacroiliac joint connects the scrum (very low back and tailbone) to the ilium (a pelvic bone which also forms half of the hip joint).  Normally this joint experiences very little motion.  When this joint becomes inflamed or unstable low back and or hip and pelvis pain may result.  Injection of this joint with local anesthetics (numbing medicines) and steroids can provide diagnostic information and reduce pain.  This injection is performed with the aid of x-ray guidance into the tailbone area while you are lying on your stomach.   You may experience an electrical sensation down the leg while this is being done.  You may also experience numbness.  We also may ask if we are reproducing your normal pain during the injection.  Conditions which may be treated SI injection:   Low back, buttock, hip or leg pain  Preparation for the Injection:  1. Do not eat any solid food or dairy products within 6 hours of your appointment.  2. You may drink clear liquids up to 2 hours before appointment.  Clear liquids include water, black coffee, juice or soda.  No milk or cream please. 3. You may take your regular medications, including pain medications with a sip of water before your appointment.  Diabetics should hold regular insulin (if take  separately) and take 1/2 normal NPH dose the morning of the procedure.  Carry some sugar containing items with you to your appointment. 4. A driver must accompany you and be prepared to drive you home after your procedure. 5. Bring all of your current medications with you. 6. An IV may be inserted and sedation may be given at the discretion of the physician. 7. A blood pressure cuff, EKG and other monitors will often be applied during the procedure.  Some patients may need to have extra oxygen administered for a short period.  8. You will be asked to provide medical information, including your allergies, prior to the procedure.  We must know immediately if you are taking blood thinners (like Coumadin/Warfarin) or if you are allergic to IV iodine contrast (dye).  We must know if you could possible be pregnant.  Possible side effects:   Bleeding from needle site  Infection (rare, may require surgery)  Nerve injury (rare)  Numbness & tingling (temporary)  A brief convulsion or seizure  Light-headedness (temporary)  Pain at injection site (several days)  Decreased blood pressure (temporary)  Weakness in the leg (temporary)   Call if you experience:   New onset weakness or numbness of an extremity below the injection site that last more than 8 hours.  Hives or difficulty breathing ( go to the emergency room)  Inflammation or drainage at the injection site  Any new symptoms which are concerning to you  Please note:  Although the local anesthetic injected can often make your back/ hip/ buttock/ leg feel good for several hours after the  injections, the pain will likely return.  It takes 3-7 days for steroids to work in the sacroiliac area.  You may not notice any pain relief for at least that one week.  If effective, we will often do a series of three injections spaced 3-6 weeks apart to maximally decrease your pain.  After the initial series, we generally will wait some months  before a repeat injection of the same type.  If you have any questions, please call [phone] [facility]  GENERAL RISKS AND COMPLICATIONS  What are the risk, side effects and possible complications? Generally speaking, most procedures are safe.  However, with any procedure there are risks, side effects, and the possibility of complications.  The risks and complications are dependent upon the sites that are lesioned, or the type of nerve block to be performed.  The closer the procedure is to the spine, the more serious the risks are.  Great care is taken when placing the radio frequency needles, block needles or lesioning probes, but sometimes complications can occur. 1. Infection: Any time there is an injection through the skin, there is a risk of infection.  This is why sterile conditions are used for these blocks.  There are four possible types of infection. 1. Localized skin infection. 2. Central Nervous System Infection-This can be in the form of Meningitis, which can be deadly. 3. Epidural Infections-This can be in the form of an epidural abscess, which can cause pressure inside of the spine, causing compression of the spinal cord with subsequent paralysis. This would require an emergency surgery to decompress, and there are no guarantees that the patient would recover from the paralysis. 4. Discitis-This is an infection of the intervertebral discs.  It occurs in about 1% of discography procedures.  It is difficult to treat and it may lead to surgery.        2. Pain: the needles have to go through skin and soft tissues, will cause soreness.       3. Damage to internal structures:  The nerves to be lesioned may be near blood vessels or    other nerves which can be potentially damaged.       4. Bleeding: Bleeding is more common if the patient is taking blood thinners such as  aspirin, Coumadin, Ticiid, Plavix, etc., or if he/she have some genetic  predisposition  such as hemophilia. Bleeding into the spinal canal can cause compression of the spinal  cord with subsequent paralysis.  This would require an emergency surgery to  decompress and there are no guarantees that the patient would recover from the  paralysis.       5. Pneumothorax:  Puncturing of a lung is a possibility, every time a needle is introduced in  the area of the chest or upper back.  Pneumothorax refers to free air around the  collapsed lung(s), inside of the thoracic cavity (chest cavity).  Another two possible  complications related to a similar event would include: Hemothorax and Chylothorax.   These are variations of the Pneumothorax, where instead of air around the collapsed  lung(s), you may have blood or chyle, respectively.       6. Spinal headaches: They may occur with any procedures in the area of the spine.       7. Persistent CSF (Cerebro-Spinal Fluid) leakage: This is a rare problem, but may occur  with prolonged intrathecal or epidural catheters either due to the formation of a fistulous  track or a dural tear.       8. Nerve damage: By working so close to the spinal cord, there is always a possibility of  nerve damage, which could be as serious as a permanent spinal cord injury with  paralysis.       9. Death:  Although rare, severe deadly allergic reactions known as "Anaphylactic  reaction" can occur to any of the medications used.      10. Worsening of the symptoms:  We can always make thing worse.  What are the chances of something like this happening? Chances of any of this occuring are extremely low.  By statistics, you have more of a chance of getting killed in a motor vehicle accident: while driving to the hospital than any of the above occurring .  Nevertheless, you should be aware that they are possibilities.  In general, it is similar to taking a shower.  Everybody knows that you can slip, hit your head and get killed.  Does that mean that you should not  shower again?  Nevertheless always keep in mind that statistics do not mean anything if you happen to be on the wrong side of them.  Even if a procedure has a 1 (one) in a 1,000,000 (million) chance of going wrong, it you happen to be that one..Also, keep in mind that by statistics, you have more of a chance of having something go wrong when taking medications.  Who should not have this procedure? If you are on a blood thinning medication (e.g. Coumadin, Plavix, see list of "Blood Thinners"), or if you have an active infection going on, you should not have the procedure.  If you are taking any blood thinners, please inform your physician.  How should I prepare for this procedure?  Do not eat or drink anything at least six hours prior to the procedure.  Bring a driver with you .  It cannot be a taxi.  Come accompanied by an adult that can drive you back, and that is strong enough to help you if your legs get weak or numb from the local anesthetic.  Take all of your medicines the morning of the procedure with just enough water to swallow them.  If you have diabetes, make sure that you are scheduled to have your procedure done first thing in the morning, whenever possible.  If you have diabetes, take only half of your insulin dose and notify our nurse that you have done so as soon as you arrive at the clinic.  If you are diabetic, but only take blood sugar pills (oral hypoglycemic), then do not take them on the morning of your procedure.  You may take them after you have had the procedure.  Do not take aspirin or any aspirin-containing medications, at least eleven (11) days prior to the procedure.  They may prolong bleeding.  Wear loose fitting clothing that may be easy to take off and that you would not mind if it got stained with Betadine or blood.  Do not wear any jewelry or perfume  Remove any nail coloring.  It will interfere with some of our monitoring equipment.  NOTE: Remember that  this is not meant to be interpreted as a complete list of all possible complications.  Unforeseen problems may occur.  BLOOD THINNERS The following drugs contain aspirin or other products, which can cause increased bleeding during surgery and should not be taken for 2 weeks prior to and 1 week after surgery.  If you should need take something for relief of minor pain, you may take acetaminophen which is found in Tylenol,m Datril, Anacin-3 and Panadol. It is not blood thinner. The products listed below are.  Do not take any of the products listed below in addition to any listed on your instruction sheet.  A.P.C or A.P.C with Codeine Codeine Phosphate Capsules #3 Ibuprofen Ridaura  ABC compound Congesprin Imuran rimadil  Advil Cope Indocin Robaxisal  Alka-Seltzer Effervescent Pain Reliever and Antacid Coricidin or Coricidin-D  Indomethacin Rufen  Alka-Seltzer plus Cold Medicine Cosprin Ketoprofen S-A-C Tablets  Anacin Analgesic Tablets or Capsules Coumadin Korlgesic Salflex  Anacin Extra Strength Analgesic tablets or capsules CP-2 Tablets Lanoril Salicylate  Anaprox Cuprimine Capsules Levenox Salocol  Anexsia-D Dalteparin Magan Salsalate  Anodynos Darvon compound Magnesium Salicylate Sine-off  Ansaid Dasin Capsules Magsal Sodium Salicylate  Anturane Depen Capsules Marnal Soma  APF Arthritis pain formula Dewitt's Pills Measurin Stanback  Argesic Dia-Gesic Meclofenamic Sulfinpyrazone  Arthritis Bayer Timed Release Aspirin Diclofenac Meclomen Sulindac  Arthritis pain formula Anacin Dicumarol Medipren Supac  Analgesic (Safety coated) Arthralgen Diffunasal Mefanamic Suprofen  Arthritis Strength Bufferin Dihydrocodeine Mepro Compound Suprol  Arthropan liquid Dopirydamole Methcarbomol with Aspirin Synalgos  ASA tablets/Enseals Disalcid Micrainin Tagament  Ascriptin Doan's Midol Talwin  Ascriptin A/D Dolene Mobidin Tanderil  Ascriptin Extra Strength Dolobid Moblgesic Ticlid  Ascriptin with Codeine  Doloprin or Doloprin with Codeine Momentum Tolectin  Asperbuf Duoprin Mono-gesic Trendar  Aspergum Duradyne Motrin or Motrin IB Triminicin  Aspirin plain, buffered or enteric coated Durasal Myochrisine Trigesic  Aspirin Suppositories Easprin Nalfon Trillsate  Aspirin with Codeine Ecotrin Regular or Extra Strength Naprosyn Uracel  Atromid-S Efficin Naproxen Ursinus  Auranofin Capsules Elmiron Neocylate Vanquish  Axotal Emagrin Norgesic Verin  Azathioprine Empirin or Empirin with Codeine Normiflo Vitamin E  Azolid Emprazil Nuprin Voltaren  Bayer Aspirin plain, buffered or children's or timed BC Tablets or powders Encaprin Orgaran Warfarin Sodium  Buff-a-Comp Enoxaparin Orudis Zorpin  Buff-a-Comp with Codeine Equegesic Os-Cal-Gesic   Buffaprin Excedrin plain, buffered or Extra Strength Oxalid   Bufferin Arthritis Strength Feldene Oxphenbutazone   Bufferin plain or Extra Strength Feldene Capsules Oxycodone with Aspirin   Bufferin with Codeine Fenoprofen Fenoprofen Pabalate or Pabalate-SF   Buffets II Flogesic Panagesic   Buffinol plain or Extra Strength Florinal or Florinal with Codeine Panwarfarin   Buf-Tabs Flurbiprofen Penicillamine   Butalbital Compound Four-way cold tablets Penicillin   Butazolidin Fragmin Pepto-Bismol   Carbenicillin Geminisyn Percodan   Carna Arthritis Reliever Geopen Persantine   Carprofen Gold's salt Persistin   Chloramphenicol Goody's Phenylbutazone   Chloromycetin Haltrain Piroxlcam   Clmetidine heparin Plaquenil   Cllnoril Hyco-pap Ponstel   Clofibrate Hydroxy chloroquine Propoxyphen         Before stopping any of these medications, be sure to consult the physician who ordered them.  Some, such as Coumadin (Warfarin) are ordered to prevent or treat serious conditions such as "deep thrombosis", "pumonary embolisms", and other heart problems.  The amount of time that you may need off of the medication may also vary with the medication and the reason for which  you were taking it.  If you are taking any of these medications, please make sure you notify your pain physician before you undergo any procedures.

## 2015-06-03 NOTE — Progress Notes (Signed)
Safety precautions to be maintained throughout the outpatient stay will include: orient to surroundings, keep bed in low position, maintain call bell within reach at all times, provide assistance with transfer out of bed and ambulation.  

## 2015-06-09 ENCOUNTER — Ambulatory Visit: Payer: Medicaid Other | Attending: Pain Medicine | Admitting: Pain Medicine

## 2015-06-09 ENCOUNTER — Other Ambulatory Visit: Payer: Self-pay | Admitting: Pain Medicine

## 2015-06-09 VITALS — BP 95/53 | HR 78 | Temp 98.1°F | Resp 16 | Ht 62.0 in | Wt 182.0 lb

## 2015-06-09 DIAGNOSIS — M5126 Other intervertebral disc displacement, lumbar region: Secondary | ICD-10-CM | POA: Diagnosis not present

## 2015-06-09 DIAGNOSIS — M5481 Occipital neuralgia: Secondary | ICD-10-CM

## 2015-06-09 DIAGNOSIS — M545 Low back pain: Secondary | ICD-10-CM | POA: Diagnosis present

## 2015-06-09 DIAGNOSIS — M4806 Spinal stenosis, lumbar region: Secondary | ICD-10-CM | POA: Insufficient documentation

## 2015-06-09 DIAGNOSIS — M5030 Other cervical disc degeneration, unspecified cervical region: Secondary | ICD-10-CM

## 2015-06-09 DIAGNOSIS — M79605 Pain in left leg: Secondary | ICD-10-CM | POA: Diagnosis present

## 2015-06-09 DIAGNOSIS — M461 Sacroiliitis, not elsewhere classified: Secondary | ICD-10-CM

## 2015-06-09 DIAGNOSIS — M961 Postlaminectomy syndrome, not elsewhere classified: Secondary | ICD-10-CM

## 2015-06-09 DIAGNOSIS — M79604 Pain in right leg: Secondary | ICD-10-CM | POA: Diagnosis present

## 2015-06-09 DIAGNOSIS — M170 Bilateral primary osteoarthritis of knee: Secondary | ICD-10-CM

## 2015-06-09 DIAGNOSIS — M5136 Other intervertebral disc degeneration, lumbar region: Secondary | ICD-10-CM | POA: Diagnosis not present

## 2015-06-09 DIAGNOSIS — M47818 Spondylosis without myelopathy or radiculopathy, sacral and sacrococcygeal region: Secondary | ICD-10-CM

## 2015-06-09 MED ORDER — TRIAMCINOLONE ACETONIDE 40 MG/ML IJ SUSP
INTRAMUSCULAR | Status: AC
  Administered 2015-06-09: 13:00:00
  Filled 2015-06-09: qty 1

## 2015-06-09 MED ORDER — MIDAZOLAM HCL 5 MG/5ML IJ SOLN
INTRAMUSCULAR | Status: AC
  Administered 2015-06-09: 5 mg via INTRAVENOUS
  Filled 2015-06-09: qty 5

## 2015-06-09 MED ORDER — FENTANYL CITRATE (PF) 100 MCG/2ML IJ SOLN
INTRAMUSCULAR | Status: AC
  Administered 2015-06-09: 100 ug
  Filled 2015-06-09: qty 2

## 2015-06-09 MED ORDER — BUPIVACAINE HCL (PF) 0.25 % IJ SOLN
INTRAMUSCULAR | Status: AC
  Administered 2015-06-09: 13:00:00
  Filled 2015-06-09: qty 30

## 2015-06-09 MED ORDER — ORPHENADRINE CITRATE 30 MG/ML IJ SOLN
INTRAMUSCULAR | Status: AC
  Administered 2015-06-09: 13:00:00
  Filled 2015-06-09: qty 2

## 2015-06-09 NOTE — Progress Notes (Signed)
Safety precautions to be maintained throughout the outpatient stay will include: orient to surroundings, keep bed in low position, maintain call bell within reach at all times, provide assistance with transfer out of bed and ambulation.  

## 2015-06-09 NOTE — Progress Notes (Signed)
Subjective:    Patient ID: [name], female    DOB: 1975-11-05, 39 y.o.   MRN: [id number]  HPI  PROCEDURE:  Block of nerves to the sacroiliac joint.   NOTE:  The patient is a 39 y.o. female who returns to the [facility] for further evaluation and treatment of pain involving the lower back and lower extremity region with pain in the region of the buttocks as well. Prior  MRI studies reveal degenerative disc disease lumbar spine Status post prior hemilaminectomy L2, degenerative changes lumbar spine with stenosis and moderate bilateral foraminal stenosis, L3-4 degenerative disc disease and facet changes with diffuse disc bulging and disc material abutting the L3 nerve root in the foraminal zone, L4-5 degenerative disc disease, diffuse facet changes noted throughout the lumbar spine.   Patient is with reproduction of severe pain with palpation over the PSIS and PII S regions and with positive Patrick's maneuver There is concern regarding a significant component of the patient's pain being due to sacroiliac joint dysfunction The risks, benefits, expectations of the procedure have been discussed and explained to the patient who is understanding and willing to proceed with interventional treatment in attempt to decrease severity of patient's symptoms, minimize the risk of medication escalation and  hopefully retard the progression of the patient's symptoms. We will proceed with what is felt to be a medically necessary procedure, block of nerves to the sacroiliac joint.   DESCRIPTION OF PROCEDURE:  Block of nerves to the sacroiliac joint.   The patient was taken to the fluoroscopy suite. With the patient in the prone position with EKG, blood pressure, pulse and pulse oximetry monitoring, IV Versed, IV fentanyl conscious sedation, Betadine prep of proposed entry site was performed.   Block of nerves at the L5 vertebral body level.   With the patient in prone position, under  fluoroscopic guidance, a 22 -gauge needle was inserted at the L5 vertebral body level on the  left side. With 15 degrees oblique orientation a 22 -gauge needle was inserted in the region known as Burton's eye or eye of the Scotty dog. Following documentation of needle placement in the area of Burton's eye or eye of the Scotty dog under fluoroscopic guidance, needle placement was then accomplished at the sacral ala level on the  left side.   Needle placement at the sacral ala.   With the patient in prone position under fluoroscopic guidance with AP view of the lumbosacral spine, a 22 -gauge needle was inserted in the region known as the sacral ala on the  left side. Following documentation of needle placement on the  left side under fluoroscopic guidance needle placement was then accomplished at the S1 foramen level.   Needle placement at the S1 foramen level.   With the patient in prone position under fluoroscopic guidance with AP view of the lumbosacral spine and cephalad orientation, a 22 -gauge needle was inserted at the superior and lateral border of the S1 foramen on the  left side. Following documentation of needle placement at the S1 foramen level on the  left side, needle placement was then accomplished at the S2 foramen level on the  left side.   Needle placement at the S2 foramen level.   With the patient in prone position with AP view of the lumbosacral spine with cephalad orientation, a 22 - gauge needle was inserted at the superior and lateral border of the S2 foramen under fluoroscopic guidance on the  left side.  Following needle placement at the L5 vertebral body level, sacral ala, S1 foramen and S2 foramen on the left side, needle placement was verified on lateral view under fluoroscopic guidance.  Following needle placement documentation on lateral view, each needle was injected with 1 mL of 0.25% bupivacaine and Kenalog.   BLOCK OF THE NERVES TO SACROILIAC JOINT ON THE RIGHT  SIDE The procedure was performed on the right side at the same levels as was performed on the left side and utilizing the same technique as on the left side and was performed under fluoroscopic guidance as on the left side   A total of  of Kenalog was utilized for the procedure.   PLAN:  1. Medications: The patient will continue presently prescribed medications diclofenac and oxycodone 2. The patient will be considered for modification of treatment regimen pending response to the procedure performed on today's visit.  3. The patient is to follow-up with primary care physician Dr. Allena Katz and Seward Carol for evaluation of blood pressure and general medical condition following the procedure performed on today's visit.  4. Surgical evaluation as discussed.  5. Neurological evaluation as discussed  and psych follow-up evaluation as planned  6. The patient may be a candidate for radiofrequency procedures, implantation devices and other treatment pending response to treatment performed on today's visit and follow-up evaluation.  7. The patient has been advised to adhere to proper body mechanics and to avoid activities which may exacerbate the patient's symptoms.   Return appointment to [facility] as scheduled.    Review of Systems     Objective:   Physical Exam        Assessment & Plan:

## 2015-06-09 NOTE — Patient Instructions (Addendum)
Continue present medications diclofenac and oxycodone  F/U PCP Dr. Allena Katz and Seward Carol for evaliation of  BP and general medical  condition  F/U surgical evaluation as discussed  F/U neurological evaluation  F/U psych evaluation as discussed  May consider radiofrequency rhizolysis or intraspinal procedures pending response to present treatment and F/U evaluation   Patient to call [facility] should patient have concerns prior to scheduled return appointmen.

## 2015-06-10 ENCOUNTER — Telehealth: Payer: Self-pay | Admitting: *Deleted

## 2015-06-10 NOTE — Telephone Encounter (Signed)
States had headache yesterday but has subsided today. Instructed to call if it returns or has any new symptoms.

## 2015-06-23 ENCOUNTER — Ambulatory Visit: Payer: Medicaid Other

## 2015-07-03 ENCOUNTER — Ambulatory Visit: Payer: Medicaid Other | Attending: Pain Medicine | Admitting: Pain Medicine

## 2015-07-03 ENCOUNTER — Encounter: Payer: Self-pay | Admitting: Pain Medicine

## 2015-07-03 VITALS — BP 124/83 | HR 124 | Temp 98.3°F | Resp 16 | Ht 62.0 in | Wt 182.0 lb

## 2015-07-03 DIAGNOSIS — M47816 Spondylosis without myelopathy or radiculopathy, lumbar region: Secondary | ICD-10-CM | POA: Insufficient documentation

## 2015-07-03 DIAGNOSIS — M47818 Spondylosis without myelopathy or radiculopathy, sacral and sacrococcygeal region: Secondary | ICD-10-CM

## 2015-07-03 DIAGNOSIS — M5481 Occipital neuralgia: Secondary | ICD-10-CM | POA: Diagnosis not present

## 2015-07-03 DIAGNOSIS — M533 Sacrococcygeal disorders, not elsewhere classified: Secondary | ICD-10-CM | POA: Diagnosis not present

## 2015-07-03 DIAGNOSIS — M5136 Other intervertebral disc degeneration, lumbar region: Secondary | ICD-10-CM | POA: Diagnosis not present

## 2015-07-03 DIAGNOSIS — M5030 Other cervical disc degeneration, unspecified cervical region: Secondary | ICD-10-CM | POA: Diagnosis not present

## 2015-07-03 DIAGNOSIS — M4806 Spinal stenosis, lumbar region: Secondary | ICD-10-CM | POA: Diagnosis not present

## 2015-07-03 DIAGNOSIS — M79604 Pain in right leg: Secondary | ICD-10-CM | POA: Diagnosis present

## 2015-07-03 DIAGNOSIS — M170 Bilateral primary osteoarthritis of knee: Secondary | ICD-10-CM | POA: Diagnosis not present

## 2015-07-03 DIAGNOSIS — M79605 Pain in left leg: Secondary | ICD-10-CM | POA: Diagnosis present

## 2015-07-03 DIAGNOSIS — M545 Low back pain: Secondary | ICD-10-CM | POA: Diagnosis present

## 2015-07-03 DIAGNOSIS — M461 Sacroiliitis, not elsewhere classified: Secondary | ICD-10-CM

## 2015-07-03 DIAGNOSIS — M961 Postlaminectomy syndrome, not elsewhere classified: Secondary | ICD-10-CM

## 2015-07-03 MED ORDER — OXYCODONE HCL 10 MG PO TABS
ORAL_TABLET | ORAL | Status: DC
Start: 2015-07-03 — End: 2015-07-31

## 2015-07-03 MED ORDER — DICLOFENAC POTASSIUM 50 MG PO TABS: ORAL_TABLET | ORAL | Status: DC

## 2015-07-03 NOTE — Progress Notes (Signed)
Safety precautions to be maintained throughout the outpatient stay will include: orient to surroundings, keep bed in low position, maintain call bell within reach at all times, provide assistance with transfer out of bed and ambulation.  

## 2015-07-03 NOTE — Progress Notes (Signed)
Subjective:    Patient ID: [name], female    DOB: 04/25/1976, 39 y.o.   MRN: [id number]  HPI  Patient is 39 year old female returns to [facility] for further evaluation and treatment of pain involving the lower back and lower extremity region. Patient also with history of pain in the cervical region and headaches. At the present time patient is to be with significant pain involving the region of the knee. We discussed patient's condition and will proceed with further evaluation of the knee with surgical evaluation to be scheduled at this time. We will also proceed with geniculate nerve blocks of the knee in attempt to decrease severity of symptoms, minimize progression of symptoms, and avoid the need for more involved treatment. The patient was understanding and agree with suggested treatment plan. The patient denied any trauma change in events of daily living the call significant change in symptomatology. We will proceed with further evaluation and treatment of right knee at this time as discussed and will continue oxycodone diclofenac. The patient was in agreement with suggested treatment plan.      Review of Systems     Objective:   Physical Exam  There was tenderness of the splenius capitis and occipitalis region of mild degree. No new lesions of the head and neck were noted. There was tenderness over the region of the acromioclavicular and glenohumeral joint region a mild degree. Patient appeared to be with slightly decreased grip strength. Tinel and Phalen's maneuver without increased pain significant degree. There was tenderness over the cervical facet cervical paraspinal muscular region as well as the thoracic facet thoracic paraspinal musculature region. Palpation over the region of the lumbar paraspinal muscles region was associated with increased pain with lateral bending and rotation and extension and palpation of the lumbar facets reproducing mild/moderate  discomfort. No crepitus of the thoracic region was noted. Straight leg raising tolerates approximately 20 without definite increased pain with dorsiflexion noted. Knee was with crepitus of the knee and increased pain with range of motion of the knee. There was negative clonus and negative Homans. DTRs difficult to elicit patient had difficulty relaxing. Examination the knee with the patient to be with crepitus of the knee negative anterior and posterior drawer signs without ballottement of the patella. There was no increased warmth or erythema of the knee. There was significant increased pain with range of motion of the knee and significant crepitus of the knee noted. EHL strength was decreased. No definite sensory deficit of dermatomal detected. There was tenderness over the PSIS PII S regions of mild to moderate degree and mild to moderate tenderness of the gluteal and piriformis musketry region was noted Abdomen nontender no costovertebral tenderness noted.      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, degenerative changes lumbar spine with stenosis and moderate bilateral foraminal stenosis, L3-4 degenerative disc disease and facet changes with diffuse disc bulging and disc material abutting the L3 nerve root in the foraminal zone, L4-5 degenerative disc disease, diffuse facet changes noted throughout the lumbar spine  Lumbar facet syndrome  Degenerative joint disease of knees  Sacroiliac joint dysfunction  Degenerative disc disease cervical spine  Cervical facet syndrome  Bilateral occipital neuralgia   PLAN   Continue present medication diclofenac and oxycodone  Geniculate nerve blocks of the need to be performed at time return appointment  F/U PCP Dr. Allena Katz for evaliation of  BP and general medical  condition  F/U surgical evaluation of knee scheduled  F/U neurological evaluation. May consider pending follow-up  evaluations  F/U psych evaluation as planned  May consider radiofrequency rhizolysis or intraspinal procedures pending response to present treatment and F/U evaluation   Patient to call [facility] should patient have concerns prior to scheduled return appointment.

## 2015-07-03 NOTE — Patient Instructions (Addendum)
PLAN   Continue present medication diclofenac and oxycodone  Geniculate nerve blocks of need to be performed at time return appointment   F/U PCP Dr. Allena Katz for evaliation of  BP and general medical  condition  F/U surgical evaluation. Ask Morrie Sheldon date of your MRI of the right knee and surgical evaluation of right knee  F/U neurological evaluation. May consider pending follow-up evaluations  F/U psych eval as planned  Ask Angie and Morrie Sheldon if you have been approved for radiofrequency rhizolysis lumbar facets, medial branch nerves   Patient to call [facility] should patient have concerns prior to scheduled return appointment.

## 2015-07-04 ENCOUNTER — Ambulatory Visit (INDEPENDENT_AMBULATORY_CARE_PROVIDER_SITE_OTHER): Payer: Medicaid Other | Admitting: Urology

## 2015-07-04 ENCOUNTER — Encounter: Payer: Self-pay | Admitting: Urology

## 2015-07-04 VITALS — BP 109/75 | HR 89 | Ht 62.0 in | Wt 194.2 lb

## 2015-07-04 DIAGNOSIS — N941 Dyspareunia: Secondary | ICD-10-CM | POA: Diagnosis not present

## 2015-07-04 DIAGNOSIS — M6289 Other specified disorders of muscle: Secondary | ICD-10-CM

## 2015-07-04 DIAGNOSIS — N8184 Pelvic muscle wasting: Secondary | ICD-10-CM

## 2015-07-04 DIAGNOSIS — R3989 Other symptoms and signs involving the genitourinary system: Secondary | ICD-10-CM | POA: Diagnosis not present

## 2015-07-04 DIAGNOSIS — IMO0002 Reserved for concepts with insufficient information to code with codable children: Secondary | ICD-10-CM

## 2015-07-04 LAB — URINALYSIS, COMPLETE
Bilirubin, UA: NEGATIVE
Glucose, UA: NEGATIVE
Ketones, UA: NEGATIVE
LEUKOCYTES UA: NEGATIVE
Nitrite, UA: NEGATIVE
PH UA: 5.5 (ref 5.0–7.5)
Protein, UA: NEGATIVE
Specific Gravity, UA: >1.03 — ABNORMAL HIGH (ref 1.005–1.030)
Urobilinogen, Ur: 0.2 mg/dL (ref 0.2–1.0)

## 2015-07-04 LAB — MICROSCOPIC EXAMINATION: BACTERIA UA: NONE SEEN

## 2015-07-04 LAB — BLADDER SCAN AMB NON-IMAGING: Scan Result: 153

## 2015-07-04 NOTE — Progress Notes (Signed)
07/04/2015 10:29 AM   [name] 08-19-1976 [id number]  Referring provider: No referring provider defined for this encounter.  Chief Complaint  Patient presents with  . Follow-up    HPI: [name] is a 39yo here for followup for LUTS, microhematuria,  pelvic pain and dyspareunia. LUTS and dyspareunia have been present for over 5 years. At her last visit she was placed on vesicare 5mg  daily. She noticed no change in urinary frequency, urgency on the vesicare. SHe has pain with holding urine and relief with urinating. She has the urge to go every 30 minutes. When she goes to the bathroom she has to relax and then she can urinate. No dysuria. PVR today is 150cc. She has nocturia 2-3x.  She notes pain with intercourse with not alleviating symptoms.  UA and micro today does not show any blood.    PMH: Past Medical History  Diagnosis Date  . Seizures     Surgical History: Past Surgical History  Procedure Laterality Date  . Tubal ligation    . Abdominal hysterectomy      Home Medications:    Medication List       This list is accurate as of: 07/04/15 10:29 AM.  Always use your most recent med list.               alprazolam 2 MG tablet  Commonly known as:  XANAX  Take 2 mg by mouth 4 (four) times daily as needed for sleep.     amphetamine-dextroamphetamine 20 MG tablet  Commonly known as:  ADDERALL  Take 30 mg by mouth. 2.5 per day     diclofenac 50 MG tablet  Commonly known as:  CATAFLAM  1 tab by mouth per day or twice a day if tolerated     fluticasone 220 MCG/ACT inhaler  Commonly known as:  FLOVENT HFA  Inhale 2 puffs into the lungs every 4 (four) hours as needed.     levothyroxine 150 MCG tablet  Commonly known as:  SYNTHROID, LEVOTHROID  Take 150 mcg by mouth. Every other day     levothyroxine 175 MCG tablet  Commonly known as:  SYNTHROID, LEVOTHROID  Take 175 mcg by mouth. Every other day     loratadine 10 MG tablet  Commonly known as:   CLARITIN  Take 10 mg by mouth daily.     omeprazole 40 MG capsule  Commonly known as:  PRILOSEC  Take 40 mg by mouth daily.     Oxycodone HCl 10 MG Tabs  1-2 tablets by mouth twice a day to 3 times a day if tolerated     solifenacin 5 MG tablet  Commonly known as:  VESICARE  Take 1 tablet (5 mg total) by mouth daily.        Allergies:  Allergies  Allergen Reactions  . Acetaminophen     Other reaction(s): Tachycardia (finding)  . Celecoxib Other (See Comments)  . Darvon [Propoxyphene] Hypertension    Heart race  . Gabapentin Rash    Family History: Family History  Problem Relation Age of Onset  . Alcohol abuse Mother   . Arthritis Mother   . Depression Mother   . Hearing loss Mother   . Alcohol abuse Father   . Arthritis Father   . COPD Father   . Depression Father   . Hearing loss Father   . Hyperlipidemia Father   . Hypertension Father     Social History:  reports that she has been  smoking Cigarettes.  She has been smoking about 1.00 pack per day. She does not have any smokeless tobacco history on file. She reports that she drinks alcohol. She reports that she does not use illicit drugs.  ROS: UROLOGY Frequent Urination?: Yes Hard to postpone urination?: Yes Burning/pain with urination?: No Get up at night to urinate?: Yes Leakage of urine?: Yes Urine stream starts and stops?: Yes Trouble starting stream?: Yes Do you have to strain to urinate?: No Blood in urine?: No Urinary tract infection?: No Sexually transmitted disease?: No Injury to kidneys or bladder?: No Painful intercourse?: Yes Weak stream?: No Currently pregnant?: No Vaginal bleeding?: No Last menstrual period?: 2005  Gastrointestinal Nausea?: Yes Vomiting?: Yes Indigestion/heartburn?: Yes Diarrhea?: No Constipation?: No  Constitutional Fever: Yes Night sweats?: Yes Weight loss?: No Fatigue?: Yes  Skin Skin rash/lesions?: No Itching?: No  Eyes Blurred vision?: No Double  vision?: No  Ears/Nose/Throat Sore throat?: No Sinus problems?: No  Hematologic/Lymphatic Swollen glands?: No Easy bruising?: Yes  Cardiovascular Leg swelling?: Yes Chest pain?: No  Respiratory Cough?: No Shortness of breath?: No  Endocrine Excessive thirst?: Yes  Musculoskeletal Back pain?: Yes Joint pain?: No  Neurological Headaches?: Yes Dizziness?: Yes  Psychologic Depression?: Yes Anxiety?: Yes  Physical Exam: BP 109/75 mmHg  Pulse 89  Ht 5\' 2"  (1.575 m)  Wt 88.089 kg (194 lb 3.2 oz)  BMI 35.51 kg/m2  Constitutional:  Alert and oriented, No acute distress. HEENT: [place] AT, moist mucus membranes.  Trachea midline, no masses. Cardiovascular: No clubbing, cyanosis, or edema. Respiratory: Normal respiratory effort, no increased work of breathing. GI: Abdomen is soft, nontender, nondistended, no abdominal masses GU: No CVA tenderness. Skin: No rashes, bruises or suspicious lesions. Lymph: No cervical or inguinal adenopathy. Neurologic: Grossly intact, no focal deficits, moving all 4 extremities. Psychiatric: Normal mood and affect.  Laboratory Data: Lab Results  Component Value Date   WBC 9.4 12/29/2014   HGB 14.4 12/29/2014   HCT 42.1 12/29/2014   MCV 89.6 12/29/2014   PLT 299 12/29/2014    Lab Results  Component Value Date   CREATININE 0.90 12/29/2014    No results found for: PSA  No results found for: TESTOSTERONE  No results found for: HGBA1C  Urinalysis    Component Value Date/Time   COLORURINE YELLOW 12/29/2014 1738   COLORURINE Yellow 10/17/2013 2032   APPEARANCEUR CLOUDY* 12/29/2014 1738   APPEARANCEUR Cloudy 10/17/2013 2032   LABSPEC 1.015 12/29/2014 1738   LABSPEC 1.018 10/17/2013 2032   PHURINE 5.5 12/29/2014 1738   PHURINE 6.0 10/17/2013 2032   GLUCOSEU Negative 04/18/2015 1131   GLUCOSEU Negative 10/17/2013 2032   HGBUR MODERATE* 12/29/2014 1738   HGBUR 1+ 10/17/2013 2032   BILIRUBINUR Negative 04/18/2015 1131    BILIRUBINUR NEGATIVE 12/29/2014 1738   BILIRUBINUR Negative 10/17/2013 2032   KETONESUR NEGATIVE 12/29/2014 1738   KETONESUR Negative 10/17/2013 2032   PROTEINUR 30* 12/29/2014 1738   PROTEINUR Negative 10/17/2013 2032   UROBILINOGEN 0.2 12/29/2014 1738   NITRITE Negative 04/18/2015 1131   NITRITE POSITIVE* 12/29/2014 1738   NITRITE Negative 10/17/2013 2032   LEUKOCYTESUR Negative 04/18/2015 1131   LEUKOCYTESUR SMALL* 12/29/2014 1738   LEUKOCYTESUR 2+ 10/17/2013 2032    Pertinent Imaging: PVR 150cc  Assessment & Plan:   1. Bladder pain  - Urinalysis, Complete - BLADDER SCAN AMB NON-IMAGING - we discussed cystoscopy under anesthesia and patient wants to try valium and PT first prior.  - pt instructed to stop vesicare 2. Dyspareunia - vaginal  valium  TID PRN - pelvic floor PT   No Follow-up on file.  Malen Gauze, MD  [facility] [address] [place], [place] [address] [phone]

## 2015-07-08 ENCOUNTER — Other Ambulatory Visit: Payer: Self-pay | Admitting: Pain Medicine

## 2015-07-09 ENCOUNTER — Encounter: Payer: Self-pay | Admitting: Pain Medicine

## 2015-07-09 ENCOUNTER — Ambulatory Visit: Payer: Medicaid Other | Attending: Pain Medicine | Admitting: Pain Medicine

## 2015-07-09 VITALS — BP 102/63 | HR 72 | Temp 98.0°F | Resp 16 | Wt 182.0 lb

## 2015-07-09 DIAGNOSIS — M47816 Spondylosis without myelopathy or radiculopathy, lumbar region: Secondary | ICD-10-CM

## 2015-07-09 DIAGNOSIS — R339 Retention of urine, unspecified: Secondary | ICD-10-CM

## 2015-07-09 DIAGNOSIS — M961 Postlaminectomy syndrome, not elsewhere classified: Secondary | ICD-10-CM

## 2015-07-09 DIAGNOSIS — M1711 Unilateral primary osteoarthritis, right knee: Secondary | ICD-10-CM | POA: Diagnosis not present

## 2015-07-09 DIAGNOSIS — M545 Low back pain: Secondary | ICD-10-CM | POA: Insufficient documentation

## 2015-07-09 DIAGNOSIS — M25561 Pain in right knee: Secondary | ICD-10-CM | POA: Diagnosis present

## 2015-07-09 DIAGNOSIS — M5136 Other intervertebral disc degeneration, lumbar region: Secondary | ICD-10-CM

## 2015-07-09 DIAGNOSIS — M5030 Other cervical disc degeneration, unspecified cervical region: Secondary | ICD-10-CM

## 2015-07-09 DIAGNOSIS — M5481 Occipital neuralgia: Secondary | ICD-10-CM

## 2015-07-09 DIAGNOSIS — M461 Sacroiliitis, not elsewhere classified: Secondary | ICD-10-CM

## 2015-07-09 DIAGNOSIS — M170 Bilateral primary osteoarthritis of knee: Secondary | ICD-10-CM

## 2015-07-09 DIAGNOSIS — M533 Sacrococcygeal disorders, not elsewhere classified: Secondary | ICD-10-CM

## 2015-07-09 DIAGNOSIS — M51369 Other intervertebral disc degeneration, lumbar region without mention of lumbar back pain or lower extremity pain: Secondary | ICD-10-CM

## 2015-07-09 DIAGNOSIS — M47818 Spondylosis without myelopathy or radiculopathy, sacral and sacrococcygeal region: Secondary | ICD-10-CM

## 2015-07-09 MED ORDER — BUPIVACAINE HCL (PF) 0.25 % IJ SOLN
INTRAMUSCULAR | Status: AC
  Administered 2015-07-09: 30 mL
  Filled 2015-07-09: qty 30

## 2015-07-09 MED ORDER — LACTATED RINGERS IV SOLN: 1000.0000 mL | INTRAVENOUS | Status: DC

## 2015-07-09 MED ORDER — ORPHENADRINE CITRATE 30 MG/ML IJ SOLN
INTRAMUSCULAR | Status: AC
  Filled 2015-07-09: qty 2

## 2015-07-09 MED ORDER — FENTANYL CITRATE (PF) 100 MCG/2ML IJ SOLN
INTRAMUSCULAR | Status: AC
  Administered 2015-07-09: 100 ug via INTRAVENOUS
  Filled 2015-07-09: qty 2

## 2015-07-09 MED ORDER — TRIAMCINOLONE ACETONIDE 40 MG/ML IJ SUSP
40.0000 mg | Freq: Once | INTRAMUSCULAR | Status: AC
Start: 2015-07-09 — End: 2015-07-09
  Administered 2015-07-09: 40 mg

## 2015-07-09 MED ORDER — MIDAZOLAM HCL 5 MG/5ML IJ SOLN
INTRAMUSCULAR | Status: AC
  Administered 2015-07-09: 5 mg via INTRAVENOUS
  Filled 2015-07-09: qty 5

## 2015-07-09 MED ORDER — MIDAZOLAM HCL 5 MG/5ML IJ SOLN
5.0000 mg | INTRAMUSCULAR | Status: DC
  Administered 2015-07-09: 5 mg via INTRAVENOUS

## 2015-07-09 MED ORDER — ORPHENADRINE CITRATE 30 MG/ML IJ SOLN: 60.0000 mg | Freq: Once | INTRAMUSCULAR | Status: DC

## 2015-07-09 MED ORDER — TRIAMCINOLONE ACETONIDE 40 MG/ML IJ SUSP
INTRAMUSCULAR | Status: AC
  Administered 2015-07-09: 40 mg
  Filled 2015-07-09: qty 1

## 2015-07-09 MED ORDER — BUPIVACAINE HCL (PF) 0.25 % IJ SOLN
30.0000 mL | Freq: Once | INTRAMUSCULAR | Status: AC
  Administered 2015-07-09: 30 mL

## 2015-07-09 MED ORDER — FENTANYL CITRATE (PF) 100 MCG/2ML IJ SOLN
100.0000 ug | INTRAMUSCULAR | Status: DC
  Administered 2015-07-09: 100 ug via INTRAVENOUS

## 2015-07-09 NOTE — Patient Instructions (Addendum)
PLAN   Continue present medication diclofenac and oxycodone  F/U PCP Dr. Allena Katz and Seward Carol for evaliation of  BP and general medical  condition  F/U surgical evaluation. May consider pending follow-up evaluations  F/U neurological evaluation. May consider pending follow-up evaluations  May consider radiofrequency rhizolysis or intraspinal procedures pending response to present treatment and F/U evaluation   Patient to call Pain Management Center should patient have concerns prior to scheduled return appointment. Pain Management Discharge Instructions  General Discharge Instructions :  If you need to reach your doctor call: Monday-Friday 8:00 am - 4:00 pm at [phone] or toll free [phone].  After clinic hours [phone] to have operator reach doctor.  Bring all of your medication bottles to all your appointments in the pain clinic.  To cancel or reschedule your appointment with Pain Management please remember to call 24 hours in advance to avoid a fee.  Refer to the educational materials which you have been given on: General Risks, I had my Procedure. Discharge Instructions, Post Sedation.  Post Procedure Instructions:  The drugs you were given will stay in your system until tomorrow, so for the next 24 hours you should not drive, make any legal decisions or drink any alcoholic beverages.  You may eat anything you prefer, but it is better to start with liquids then soups and crackers, and gradually work up to solid foods.  Please notify your doctor immediately if you have any unusual bleeding, trouble breathing or pain that is not related to your normal pain.  Depending on the type of procedure that was done, some parts of your body may feel week and/or numb.  This usually clears up by tonight or the next day.  Walk with the use of an assistive device or accompanied by an adult for the 24 hours.  You may use ice on the affected area for the first 24 hours.  Put ice in a  Ziploc bag and cover with a towel and place against area 15 minutes on 15 minutes off.  You may switch to heat after 24 hours.GENERAL RISKS AND COMPLICATIONS  What are the risk, side effects and possible complications? Generally speaking, most procedures are safe.  However, with any procedure there are risks, side effects, and the possibility of complications.  The risks and complications are dependent upon the sites that are lesioned, or the type of nerve block to be performed.  The closer the procedure is to the spine, the more serious the risks are.  Great care is taken when placing the radio frequency needles, block needles or lesioning probes, but sometimes complications can occur. 1. Infection: Any time there is an injection through the skin, there is a risk of infection.  This is why sterile conditions are used for these blocks.  There are four possible types of infection. 1. Localized skin infection. 2. Central Nervous System Infection-This can be in the form of Meningitis, which can be deadly. 3. Epidural Infections-This can be in the form of an epidural abscess, which can cause pressure inside of the spine, causing compression of the spinal cord with subsequent paralysis. This would require an emergency surgery to decompress, and there are no guarantees that the patient would recover from the paralysis. 4. Discitis-This is an infection of the intervertebral discs.  It occurs in about 1% of discography procedures.  It is difficult to treat and it may lead to surgery.        2. Pain: the needles have to go through  skin and soft tissues, will cause soreness.       3. Damage to internal structures:  The nerves to be lesioned may be near blood vessels or    other nerves which can be potentially damaged.       4. Bleeding: Bleeding is more common if the patient is taking blood thinners such as  aspirin, Coumadin, Ticiid, Plavix, etc., or if he/she have some genetic predisposition  such as  hemophilia. Bleeding into the spinal canal can cause compression of the spinal  cord with subsequent paralysis.  This would require an emergency surgery to  decompress and there are no guarantees that the patient would recover from the  paralysis.       5. Pneumothorax:  Puncturing of a lung is a possibility, every time a needle is introduced in  the area of the chest or upper back.  Pneumothorax refers to free air around the  collapsed lung(s), inside of the thoracic cavity (chest cavity).  Another two possible  complications related to a similar event would include: Hemothorax and Chylothorax.   These are variations of the Pneumothorax, where instead of air around the collapsed  lung(s), you may have blood or chyle, respectively.       6. Spinal headaches: They may occur with any procedures in the area of the spine.       7. Persistent CSF (Cerebro-Spinal Fluid) leakage: This is a rare problem, but may occur  with prolonged intrathecal or epidural catheters either due to the formation of a fistulous  track or a dural tear.       8. Nerve damage: By working so close to the spinal cord, there is always a possibility of  nerve damage, which could be as serious as a permanent spinal cord injury with  paralysis.       9. Death:  Although rare, severe deadly allergic reactions known as "Anaphylactic  reaction" can occur to any of the medications used.      10. Worsening of the symptoms:  We can always make thing worse.  What are the chances of something like this happening? Chances of any of this occuring are extremely low.  By statistics, you have more of a chance of getting killed in a motor vehicle accident: while driving to the hospital than any of the above occurring .  Nevertheless, you should be aware that they are possibilities.  In general, it is similar to taking a shower.  Everybody knows that you can slip, hit your head and get killed.  Does that mean that you should not shower again?  Nevertheless  always keep in mind that statistics do not mean anything if you happen to be on the wrong side of them.  Even if a procedure has a 1 (one) in a 1,000,000 (million) chance of going wrong, it you happen to be that one..Also, keep in mind that by statistics, you have more of a chance of having something go wrong when taking medications.  Who should not have this procedure? If you are on a blood thinning medication (e.g. Coumadin, Plavix, see list of "Blood Thinners"), or if you have an active infection going on, you should not have the procedure.  If you are taking any blood thinners, please inform your physician.  How should I prepare for this procedure?  Do not eat or drink anything at least six hours prior to the procedure.  Bring a driver with you .  It cannot be a  taxi.  Come accompanied by an adult that can drive you back, and that is strong enough to help you if your legs get weak or numb from the local anesthetic.  Take all of your medicines the morning of the procedure with just enough water to swallow them.  If you have diabetes, make sure that you are scheduled to have your procedure done first thing in the morning, whenever possible.  If you have diabetes, take only half of your insulin dose and notify our nurse that you have done so as soon as you arrive at the clinic.  If you are diabetic, but only take blood sugar pills (oral hypoglycemic), then do not take them on the morning of your procedure.  You may take them after you have had the procedure.  Do not take aspirin or any aspirin-containing medications, at least eleven (11) days prior to the procedure.  They may prolong bleeding.  Wear loose fitting clothing that may be easy to take off and that you would not mind if it got stained with Betadine or blood.  Do not wear any jewelry or perfume  Remove any nail coloring.  It will interfere with some of our monitoring equipment.  NOTE: Remember that this is not meant to be  interpreted as a complete list of all possible complications.  Unforeseen problems may occur.  BLOOD THINNERS The following drugs contain aspirin or other products, which can cause increased bleeding during surgery and should not be taken for 2 weeks prior to and 1 week after surgery.  If you should need take something for relief of minor pain, you may take acetaminophen which is found in Tylenol,m Datril, Anacin-3 and Panadol. It is not blood thinner. The products listed below are.  Do not take any of the products listed below in addition to any listed on your instruction sheet.  A.P.C or A.P.C with Codeine Codeine Phosphate Capsules #3 Ibuprofen Ridaura  ABC compound Congesprin Imuran rimadil  Advil Cope Indocin Robaxisal  Alka-Seltzer Effervescent Pain Reliever and Antacid Coricidin or Coricidin-D  Indomethacin Rufen  Alka-Seltzer plus Cold Medicine Cosprin Ketoprofen S-A-C Tablets  Anacin Analgesic Tablets or Capsules Coumadin Korlgesic Salflex  Anacin Extra Strength Analgesic tablets or capsules CP-2 Tablets Lanoril Salicylate  Anaprox Cuprimine Capsules Levenox Salocol  Anexsia-D Dalteparin Magan Salsalate  Anodynos Darvon compound Magnesium Salicylate Sine-off  Ansaid Dasin Capsules Magsal Sodium Salicylate  Anturane Depen Capsules Marnal Soma  APF Arthritis pain formula Dewitt's Pills Measurin Stanback  Argesic Dia-Gesic Meclofenamic Sulfinpyrazone  Arthritis Bayer Timed Release Aspirin Diclofenac Meclomen Sulindac  Arthritis pain formula Anacin Dicumarol Medipren Supac  Analgesic (Safety coated) Arthralgen Diffunasal Mefanamic Suprofen  Arthritis Strength Bufferin Dihydrocodeine Mepro Compound Suprol  Arthropan liquid Dopirydamole Methcarbomol with Aspirin Synalgos  ASA tablets/Enseals Disalcid Micrainin Tagament  Ascriptin Doan's Midol Talwin  Ascriptin A/D Dolene Mobidin Tanderil  Ascriptin Extra Strength Dolobid Moblgesic Ticlid  Ascriptin with Codeine Doloprin or Doloprin  with Codeine Momentum Tolectin  Asperbuf Duoprin Mono-gesic Trendar  Aspergum Duradyne Motrin or Motrin IB Triminicin  Aspirin plain, buffered or enteric coated Durasal Myochrisine Trigesic  Aspirin Suppositories Easprin Nalfon Trillsate  Aspirin with Codeine Ecotrin Regular or Extra Strength Naprosyn Uracel  Atromid-S Efficin Naproxen Ursinus  Auranofin Capsules Elmiron Neocylate Vanquish  Axotal Emagrin Norgesic Verin  Azathioprine Empirin or Empirin with Codeine Normiflo Vitamin E  Azolid Emprazil Nuprin Voltaren  Bayer Aspirin plain, buffered or children's or timed BC Tablets or powders Encaprin Orgaran Warfarin Sodium  Buff-a-Comp Enoxaparin Orudis  Zorpin  Buff-a-Comp with Codeine Equegesic Os-Cal-Gesic   Buffaprin Excedrin plain, buffered or Extra Strength Oxalid   Bufferin Arthritis Strength Feldene Oxphenbutazone   Bufferin plain or Extra Strength Feldene Capsules Oxycodone with Aspirin   Bufferin with Codeine Fenoprofen Fenoprofen Pabalate or Pabalate-SF   Buffets II Flogesic Panagesic   Buffinol plain or Extra Strength Florinal or Florinal with Codeine Panwarfarin   Buf-Tabs Flurbiprofen Penicillamine   Butalbital Compound Four-way cold tablets Penicillin   Butazolidin Fragmin Pepto-Bismol   Carbenicillin Geminisyn Percodan   Carna Arthritis Reliever Geopen Persantine   Carprofen Gold's salt Persistin   Chloramphenicol Goody's Phenylbutazone   Chloromycetin Haltrain Piroxlcam   Clmetidine heparin Plaquenil   Cllnoril Hyco-pap Ponstel   Clofibrate Hydroxy chloroquine Propoxyphen         Before stopping any of these medications, be sure to consult the physician who ordered them.  Some, such as Coumadin (Warfarin) are ordered to prevent or treat serious conditions such as "deep thrombosis", "pumonary embolisms", and other heart problems.  The amount of time that you may need off of the medication may also vary with the medication and the reason for which you were taking it.   If you are taking any of these medications, please make sure you notify your pain physician before you undergo any procedures.

## 2015-07-09 NOTE — Progress Notes (Signed)
Subjective:    Patient ID: [name], female    DOB: [birth year], 39 y.o.   MRN: [id number]  HPI  Geniculate nerve blocks of the right knee   The patient is a 39 y.o. female who returns to the [facility] for further evaluation and treatment of pain involving the lumbar lower extremity region with severe pain of the right knee. The patient is with significant degenerative joint disease of the knee.  We will proceed with geniculate nerve blocks of the right knee in an attempt to decrease severity of symptoms, minimize the risk of medication escalation, hopefully retard progression of symptoms and avoid the need for more involved treatment.  The risks benefits and expectations of the procedure were discussed with the patient. The patient was with understanding and in agreement with suggested treatment plan.  DESCRIPTION OF PROCEDURE: Geniculate nerve blocks of the right knee. The  procedure was performed with IV Versed and IV fentanyl, conscious sedation and under fluoroscopic guidance.  NEEDLE PLACEMENT FOR BLOCK OF THE LATERAL SUPERIOR GENICULATE NERVE: The patient was taking to the fluoroscopy suite. With the patient supine, with knee in flexed position, Betadine prep of proposed entry site accomplished.  IV Versed, IV fentanyl conscious sedation, EKG, blood pressure, pulse and pulse oximetry monitoring were all in place. Under fluoroscopic guidance, a 22-gauge needle was inserted in the region of the right knee with needle placed at the lateral border of the femur at the junction of the shaft of the femur and the condyle of the femur.  Following needle placement at the lateral aspect of the knee, needle placement was then accomplished in the region of the medial aspect of the knee.  NEEDLE PLACEMENT FOR BLOCK OF THE MEDIAL SUPERIOR GENICULATE NERVE:  Under fluoroscopic guidance, a 22 - gauge needle was inserted in the region of the right knee with needle placed at the medial border  of the femur at the junction of the shaft of the femur and the condyle of the femur.   NEEDLE PLACEMENT FOR BLOCK OF THE MEDIAL INFERIOR GENICULATE NERVE:  Under fluoroscopic guidance, a 22 - gauge needle was inserted in the region of the right knee with needle placed at the junction of the shaft and plateau of the tibia.   Following needle placement on AP view of needles placed in all three locations, placement was then verified on lateral view with the tips of the superior lateral and superior medial needles documented to be one half the distance of the shaft of the femur and the tip of the inferior medial geniculate needle documented to be one half the distance of the shaft of the tibia.  Following documentation of needle placements on lateral view, each needle was injected with one mL of 0.25% bupivacaine with Kenalog. A total of 10 mg of Kenalog was utilized for the entire procedure. The patient tolerated the procedure well.    PLAN 1. Medications: Continue present medications diclofenac and oxycodone  2. Follow-up appointment with PCP Dr. Allena Katz and Seward Carol for evaluation of blood pressure and general medical condition. 3. Follow-up surgical evaluation 4. Follow-up neurological evaluation and psych evaluation 5. He patient may be a candidate for radiofrequency rhizolysis and other treatment pending response to treatment on today's visit and follow-up evaluation. 6. The patient is advised to adhere to proper body mechanics and avoid activities which appear to aggravate condition   Review of Systems     Objective:   Physical Exam  Assessment & Plan:

## 2015-07-09 NOTE — Progress Notes (Signed)
Safety precautions to be maintained throughout the outpatient stay will include: orient to surroundings, keep bed in low position, maintain call bell within reach at all times, provide assistance with transfer out of bed and ambulation.  

## 2015-07-10 ENCOUNTER — Telehealth: Payer: Self-pay

## 2015-07-10 NOTE — Telephone Encounter (Signed)
Left message

## 2015-07-31 ENCOUNTER — Encounter: Payer: Medicaid Other | Admitting: Pain Medicine

## 2015-07-31 ENCOUNTER — Ambulatory Visit: Payer: Medicaid Other | Attending: Pain Medicine | Admitting: Pain Medicine

## 2015-07-31 ENCOUNTER — Encounter: Payer: Self-pay | Admitting: Pain Medicine

## 2015-07-31 VITALS — BP 114/87 | HR 118 | Temp 97.7°F | Resp 16 | Ht 62.0 in | Wt 185.0 lb

## 2015-07-31 DIAGNOSIS — M5136 Other intervertebral disc degeneration, lumbar region: Secondary | ICD-10-CM | POA: Diagnosis not present

## 2015-07-31 DIAGNOSIS — M461 Sacroiliitis, not elsewhere classified: Secondary | ICD-10-CM

## 2015-07-31 DIAGNOSIS — M533 Sacrococcygeal disorders, not elsewhere classified: Secondary | ICD-10-CM | POA: Diagnosis not present

## 2015-07-31 DIAGNOSIS — M79602 Pain in left arm: Secondary | ICD-10-CM | POA: Diagnosis present

## 2015-07-31 DIAGNOSIS — M170 Bilateral primary osteoarthritis of knee: Secondary | ICD-10-CM

## 2015-07-31 DIAGNOSIS — M5126 Other intervertebral disc displacement, lumbar region: Secondary | ICD-10-CM | POA: Diagnosis not present

## 2015-07-31 DIAGNOSIS — M4806 Spinal stenosis, lumbar region: Secondary | ICD-10-CM | POA: Diagnosis not present

## 2015-07-31 DIAGNOSIS — M5030 Other cervical disc degeneration, unspecified cervical region: Secondary | ICD-10-CM

## 2015-07-31 DIAGNOSIS — M961 Postlaminectomy syndrome, not elsewhere classified: Secondary | ICD-10-CM

## 2015-07-31 DIAGNOSIS — M47818 Spondylosis without myelopathy or radiculopathy, sacral and sacrococcygeal region: Secondary | ICD-10-CM

## 2015-07-31 DIAGNOSIS — M51369 Other intervertebral disc degeneration, lumbar region without mention of lumbar back pain or lower extremity pain: Secondary | ICD-10-CM

## 2015-07-31 DIAGNOSIS — M5481 Occipital neuralgia: Secondary | ICD-10-CM

## 2015-07-31 DIAGNOSIS — M542 Cervicalgia: Secondary | ICD-10-CM | POA: Diagnosis present

## 2015-07-31 DIAGNOSIS — M47816 Spondylosis without myelopathy or radiculopathy, lumbar region: Secondary | ICD-10-CM

## 2015-07-31 DIAGNOSIS — M545 Low back pain: Secondary | ICD-10-CM | POA: Diagnosis present

## 2015-07-31 MED ORDER — OXYCODONE HCL 10 MG PO TABS: ORAL_TABLET | ORAL | Status: DC

## 2015-07-31 MED ORDER — DICLOFENAC POTASSIUM 50 MG PO TABS: ORAL_TABLET | ORAL | Status: DC

## 2015-07-31 NOTE — Patient Instructions (Addendum)
PLAN   Continue present medication diclofenac and oxycodone  F/U PCP Dr. Allena Katz for evaliation of  BP and general medical  condition  F/U surgical evaluation. Surgical evaluation of knee as discussed Please ask Morrie Sheldon date of surgical evaluation of knee  F/U neurological evaluation. May consider pending follow-up evaluations  May consider radiofrequency rhizolysis or intraspinal procedures pending response to present treatment and F/U evaluation   Patient to call Pain Management Center should patient have concerns prior to scheduled return appointment.

## 2015-07-31 NOTE — Progress Notes (Signed)
Safety precautions to be maintained throughout the outpatient stay will include: orient to surroundings, keep bed in low position, maintain call bell within reach at all times, provide assistance with transfer out of bed and ambulation.  

## 2015-07-31 NOTE — Progress Notes (Signed)
Subjective:    Patient ID: [name], female    DOB: Sep 11, 1976, 39 y.o.   MRN: [id number]  HPI  Patient is 39 year old female who [facility] and treatment of pain involving the region of the neck upper extremity regions lower back and lower extremity regions. Patient is pain involving the region of the right knee. Patient states that the pain of the right knee is associated with weakness of the joint with patient stating that the joint appears to sleep at times. We discussed patient's overall condition. Patient is undergone prior surgical evaluation of the knee without recommendations for interventional treatment surgically. We will schedule patient for another evaluation of her knee and we will remain available to consider modifications of treatment as discussed and explained to patient on today's visit. The patient was with understanding and in agreement with suggested treatment plan. Patient stated that the geniculate nerve blocks of the knee with were less affected than the intra-articular injections of the knee which have been performed which provided significant relief of pain for the patient. We will await surgical disposition and will continue diclofenac and oxycodone as prescribed for patient at this time. The patient was in agreement suggested treatment plan     Review of Systems     Objective:   Physical Exam   There was tenderness over the region of the splenius capitis and occipitalis regions of mild degree. There was mild tinnitus of degenerative paresis levator scapula and rhomboid musculature region as well as cervical facet cervical paraspinal musculature region. Palpation over the acromioclavicular and glenohumeral joint regions reproduced minimal discomfort. There appeared to be unremarkable Spurling's maneuver. Patient appeared to be with bilaterally equal grip strength. Tinel and Phalen's maneuver were without increased  pain of significant degree. Palpation over the lumbar paraspinal muscles lumbar facet region associated with moderate discomfort. Lateral bending and rotation and extension and palpation of the lumbar facets reproduce moderate discomfort. There was straight leg raising tolerates approximately 20 without increased pain with dorsiflexion noted. EHL strength appeared to be slightly decreased. There was negative clonus negative Homans. No definite sensory deficit of dermatomal distribution was detected. There was tenderness over the PSIS and PII S region of moderate degree and moderate tends to palpation of the greater trochanteric region and iliotibial band region. The knee was without increased warmth or erythema noted there was crepitus of the knee without increased pain of the knee with range of motion maneuvers of the knee. There appeared to be unremarkable anterior and posterior drawer signs without ballottement of the patella. There was questionable increased laxity of the joint. There was negative clonus negative Homans Abdomen was nontender with no costovertebral tenderness noted.        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, degenerative changes lumbar spine with stenosis and moderate bilateral foraminal stenosis, L3-4 degenerative disc disease and facet changes with diffuse disc bulging and disc material abutting the L3 nerve root in the foraminal zone, L4-5 degenerative disc disease, diffuse facet changes noted throughout the lumbar spine  Lumbar facet syndrome  Degenerative joint disease of knees  Sacroiliac joint dysfunction  Degenerative disc disease cervical spine  Cervical facet syndrome     PLAN   Continue present medication diclofenac and oxycodone  F/U PCP Dr. Allena Katz for evaliation of  BP and general medical  condition  F/U surgical evaluation. The patient is to undergo reevaluation of  knee at this  time  F/U neurological evaluation. May consider pending follow-up evaluations  May consider radiofrequency rhizolysis or intraspinal procedures pending response to present treatment and F/U evaluation   [facility] to call [facility] should patient have concerns prior to scheduled return appointment.

## 2015-08-05 ENCOUNTER — Ambulatory Visit: Payer: Medicaid Other

## 2015-08-06 ENCOUNTER — Ambulatory Visit: Payer: Medicaid Other | Admitting: Physical Therapy

## 2015-08-12 ENCOUNTER — Ambulatory Visit: Payer: Medicaid Other | Attending: Urology | Admitting: Physical Therapy

## 2015-08-12 ENCOUNTER — Encounter: Payer: Self-pay | Admitting: Physical Therapy

## 2015-08-12 DIAGNOSIS — Z7409 Other reduced mobility: Secondary | ICD-10-CM | POA: Diagnosis present

## 2015-08-12 DIAGNOSIS — R279 Unspecified lack of coordination: Secondary | ICD-10-CM | POA: Insufficient documentation

## 2015-08-12 DIAGNOSIS — M6289 Other specified disorders of muscle: Secondary | ICD-10-CM

## 2015-08-12 DIAGNOSIS — M629 Disorder of muscle, unspecified: Secondary | ICD-10-CM | POA: Insufficient documentation

## 2015-08-12 NOTE — Patient Instructions (Addendum)
Proper sitting  Feet on the ground, knees,  Hip wide   Scar massage along scar with olive/coconut oil or lotion 5 min/day as demonstrated   Dynamic Stabilization Level 1 only   Notice expanding your diaphragm with inhale and pelvic floor lowering down  On exhale belly hugged and pelvic floor lifted  Morning and night for decreasing pain, calming, and strengthening deep core   You are now ready to begin training the deep core muscles system: diaphragm, transverse abdominis, pelvic floor . These muscles must work together as a team.           The key to these exercises to train the brain to coordinate the timing of these muscles and to have them turn on for long periods of time to hold you upright against gravity (especially important if you are on your feet all day).These muscles are postural muscles and play a role stabilizing your spine and bodyweight. By doing these repetitions slowly and correctly instead of doing crunches, you will achieve a flatter belly without a lower pooch. You are also placing your spine in a more neutral position and breathing properly which in turn, decreases your risk for problems related to your pelvic floor, abdominal, and low back such as pelvic organ prolapse, hernias, diastasis recti (separation of superficial muscles), disk herniations, spinal fractures. These exercises set a solid foundation for you to later progress to resistance/ strength training with therabands and weights and return to other typical fitness exercises with a stronger deeper core.

## 2015-08-13 NOTE — Therapy (Signed)
[place] MAIN [facility] SERVICES [address] [place], [place], [address] Phone: [phone]   Fax:  [phone]  Physical Therapy Evaluation  Patient Details  Name: [name] MRN: [id number] Date of Birth: [birth year] Referring Provider: Nicolette Bang, MD  Encounter Date: 08/12/2015      PT End of Session - 08/12/15 0918    Visit Number 1   Number of Visits 12   PT Start Time 0850   PT Stop Time 1010   PT Time Calculation (min) 80 min   Activity Tolerance Patient tolerated treatment well;No increased pain   Behavior During Therapy [facility] for tasks assessed/performed      Past Medical History  Diagnosis Date  . Seizures ([place])   . Hypothyroidism   . GERD (gastroesophageal reflux disease)     Past Surgical History  Procedure Laterality Date  . Tubal ligation    . Vaginal hysterectomy  2007      due to fibroid cysts  . Removal of l ovary Left 2012    due a cyst and scar tissue  . Removal of bilateral fallopian tubes Bilateral 2012  . Cesarean section  2003    with tubal ligation   . Cholesteatoma excision  2010    to remove a benign tumor behind R ear drum   . Spine surgery  2012    2 surgeries: to remove a herniated disc and to remove a pus pocket at incision site     There were no vitals filed for this visit.  Visit Diagnosis:  Tight fascia - Plan: PT plan of care cert/re-cert  Mobility impaired - Plan: PT plan of care cert/re-cert  Lack of coordination - Plan: PT plan of care cert/re-cert      Subjective Assessment - 08/12/15 0904    Subjective 1)  Pt reports noticing her SUI Sx starting 4 years ago when her R L ovary and  fallopian tubes were removed. Currently, pt experience leakage with coughing, sneezing, laughing. Pt denied wearing panty liners. Pt gets up at night 1x/ hr with coughing from her bronchitis.  2) bladder pain described as shooting sharp pain sometimes occuring like "someone is squeezing her  bladder and holding it for a few minutes".  It lasts for the longest for 15 min with incidents occurring 3-4x /week. Pain does not radiate outsidde bladder area. This pain causes difficulty with bowel movements. 3) dyspareunia: Pt reports a feeling of contractions when deep penetration with pain lasting from hours to a day. Pt reported it felt like her partner was hitting something. These epsiodes occur 75% of the time with sexual intercourse and is impacting the quality of life. Pt tried a vaginal pain relieving cream but it did not work. Currently pt is using Premarin cream . Pt also reproted pain with pelvic exams sometimes.  4) LBP: Hx  of removal of "broken and herniated disc" somewhere between L1-5. Currently 10/10 all the time "if not more". Pt reports DDD and arthritis. Pt reports sharps shooting pain down bilaterally (R > L )  medial thigh when pt has been sitting/standing for long periods of time. Pt receives injection in her back 1x month with pain specialist Dr. Primus Bravo.      Pertinent History Hx of fall of stairs and broke a spinal disc., lost weight from 278lbs to 161 lb across 6 years, currently 187 lbs.     Patient Stated Goals not peeing on herself, make her body healthier  Currently in Pain? Yes   Pain Score 6    Pain Location Bladder   Pain Descriptors / Indicators Sharp;Stabbing   Pain Type Chronic pain   Multiple Pain Sites Yes   Pain Score 10   Pain Location Back            [facility] PT Assessment - 08/13/15 1528    Assessment   Medical Diagnosis bladder pain   Referring Provider Nicolette Bang, MD   Precautions   Precautions None   Restrictions   Weight Bearing Restrictions No   Prior Function   Level of Independence Independent   Observation/Other Assessments   Other Surveys  --  PDFI 46%   Coordination   Gross Motor Movements are Fluid and Coordinated --  poor pelvic floor ROM (external palpation), chest breathing   Posture/Postural Control   Posture Comments  limited diaphragmatic excursion, lumopelvis instability with ASLR    AROM   Overall AROM Comments spinal flexion 90 deg without segmental movement in T-L segments with pain, ext ~10 deg with pain, sidebend WNL w/ pain    Strength   Overall Strength Comments hip abduction 3/5, 4/5 bil w/ deep core activation   Palpation   Palpation comment scar immobility LQ abdomen and back   concordant sign with palpation on C-section scar   Bed Mobility   Bed Mobility --  long sitting technique with pain, no pain with log rolling    Balance   Balance Assessed --  SL L ~3 sec, R 20 sec                   [facility] Adult PT Treatment/Exercise - 08/13/15 1528    Self-Care   Self-Care --  scar massage   Neuro Re-ed    Neuro Re-ed Details  log rolling, sitting posture, diaphragmatic breathing                PT Education - 08/13/15 1538    Education provided Yes   Education Details HEP, POC, goals, insurance coverage   Person(s) Educated Patient   Methods Explanation;Demonstration;Tactile cues;Verbal cues;Handout   Comprehension Returned demonstration;Verbalized understanding          PT Short Term Goals - 08/13/15 1545    PT SHORT TERM GOAL #1   Title Pt will demo log rolling technique without cuing 3 reps in order to minimize strain on low back, abdominal, pelvic floor and to perform bed mobility.    Time 1   Period Days   Status Achieved   PT SHORT TERM GOAL #2   Title Pt will demo proper sitting posture in order to tolerate sitting in car rides.    Time 1   Period Days   Status Achieved   PT SHORT TERM GOAL #3   Title Pt will demo IND with  proper scar massage in order decrease pain.    Time 1   Status Achieved           PT Long Term Goals - 08/12/15 0959    PT LONG TERM GOAL #1   Title --   Time --   Period --   Status --   PT LONG TERM GOAL #2   Title --   Time --   Period --   Status --   PT LONG TERM GOAL #3   Title --   Time --   Period --    Status --   PT LONG TERM GOAL #4   Title --  Time --   Period --   Status --               Plan - 08/13/15 1539    Clinical Impression Statement Pt is a 39 yo female whose signs and symptoms include lumbar and C-section scar immobility, decreased spinal mobility, decreased deep core and hip strength, poor coordination of deep core mm and body mechanics .   These deficits impact her QOL with decreased participation with ADLs. Due to [facility] insurance  coverage for 1 visit, pt will be D/C today because she is unable to pay for future visits out of pocket. Pt demo'd IND with self-care practices and met 100% of her STG. She was provided info on pro-bono services at the [facility].      Pt will benefit from skilled therapeutic intervention in order to improve on the following deficits Decreased coordination;Decreased range of motion;Difficulty walking;Increased fascial restricitons;Obesity;Decreased endurance;Decreased skin integrity;Impaired perceived functional ability;Decreased activity tolerance;Pain;Hypomobility;Impaired flexibility;Improper body mechanics;Decreased scar mobility;Decreased mobility;Decreased strength;Postural dysfunction   Rehab Potential Fair   Clinical Impairments Affecting Rehab Potential long term smoker, co-mordities, financial restraints   PT Frequency One time visit   PT Treatment/Interventions ADLs/Self Care Home Management;Therapeutic activities;Energy conservation;Scar mobilization;Functional mobility training;Patient/family education;Passive range of motion;Balance training;Therapeutic exercise;Biofeedback;Aquatic Therapy;Cryotherapy;Electrical Stimulation;Gait training;Stair training;Neuromuscular re-education;Moist [employer]   Consulted and Agree with Plan of Care Patient         Problem List Patient Active Problem List   Diagnosis Date Noted  . Sacroiliac joint dysfunction 06/03/2015  . DJD (degenerative joint disease) of knee  03/28/2015  . DDD (degenerative disc disease), lumbar 03/06/2015  . Lumbar post-laminectomy syndrome 03/06/2015  . DDD (degenerative disc disease), cervical 03/06/2015  . Degenerative joint disease of sacroiliac joint 03/06/2015  . Bilateral occipital neuralgia 03/06/2015  . Adnexal pain 07/13/2013  . Female genuine stress incontinence 07/13/2013  . Urge incontinence 07/13/2013  . FOM (frequency of micturition) 07/13/2013    Jerl Mina  ,PT, DPT, E-RYT  08/13/2015, 3:56 PM  [place] [facility] SERVICES [address] [place], [place], [address] Phone: [phone]   Fax:  [phone]  Name: [name] MRN: [id number] Date of Birth: 03/18/1976

## 2015-08-19 ENCOUNTER — Ambulatory Visit: Payer: Medicaid Other | Admitting: Urology

## 2015-08-19 ENCOUNTER — Encounter: Payer: Self-pay | Admitting: Urology

## 2015-08-26 ENCOUNTER — Encounter: Payer: Self-pay | Admitting: Urology

## 2015-08-26 ENCOUNTER — Ambulatory Visit (INDEPENDENT_AMBULATORY_CARE_PROVIDER_SITE_OTHER): Payer: Medicaid Other | Admitting: Urology

## 2015-08-26 VITALS — BP 163/80 | HR 112 | Ht 62.0 in | Wt 199.8 lb

## 2015-08-26 DIAGNOSIS — R3989 Other symptoms and signs involving the genitourinary system: Secondary | ICD-10-CM | POA: Diagnosis not present

## 2015-08-26 LAB — URINALYSIS, COMPLETE
Bilirubin, UA: NEGATIVE
GLUCOSE, UA: NEGATIVE
KETONES UA: NEGATIVE
LEUKOCYTES UA: NEGATIVE
NITRITE UA: NEGATIVE
Protein, UA: NEGATIVE
Urobilinogen, Ur: 0.2 mg/dL (ref 0.2–1.0)
pH, UA: 6 (ref 5.0–7.5)

## 2015-08-26 LAB — MICROSCOPIC EXAMINATION
Bacteria, UA: NONE SEEN
Renal Epithel, UA: NONE SEEN /hpf
WBC, UA: NONE SEEN /hpf (ref 0–?)

## 2015-08-26 NOTE — Progress Notes (Signed)
Follow-up for urinary frequency, pelvic pain and dyspareunia. LUTS and dyspareunia have been present for over 5 years. She recently tried Vesicare 5 mg with no change in urinary frequency, urgency on the vesicare. She tried Toviaz in the past without improvement. She has pain with holding urine and relief with urinating. She has the urge to go every 30 minutes. When she goes to the bathroom she has to relax and then she can urinate. No dysuria. A recent PVR was 150cc. She has nocturia 2-3x. She notes pain with intercourse with no alleviating symptoms.   Her urinary symptoms include frequency, urgency, nocturia, hesitancy and intermittent stream. 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 She'll leak urine when she coughs or is scared.   At her last visit she was started on vaginal Valium suppositories and referred to pelvic floor physical therapy. She only had one visit with physical therapy and has tried to institute some of their suggestions. She tried vaginal Valium which provided some relief.   UA today shows no white blood cells, 0-2 red blood cells, 0-10 epithelial cells, otherwise bland.  ROS: A 16 system review of systems was obtained which was negative except for the history of present illness above and the following: Fatigue, easy bruising, cough, shortness of breath, excessive thirst, back pain, headaches, depression and anxiety.  A/P: mixed incontinence, pelvic pain, storage and voiding symptoms - recommended she continue physical therapy. Discussed some other options including trial of other antimuscarinic, cystoscopy hydrodistention in OR, hydroxyzine. She has not had pelvic exam and cystoscopy in > 2 yrs and exam of the pelvic floor and eval for stress UI with valsalva might be helpful in guiding the diagnosis. She also might benefit from Urodynamics in [place] due to mixed incontinence and mixed storage and voiding symptoms. I gave her some literature to review on UDS.    F/u for cysto/exam consider UDS.

## 2015-09-02 ENCOUNTER — Encounter: Payer: Self-pay | Admitting: Pain Medicine

## 2015-09-02 ENCOUNTER — Ambulatory Visit: Payer: Medicaid Other | Attending: Pain Medicine | Admitting: Pain Medicine

## 2015-09-02 VITALS — BP 120/84 | HR 116 | Temp 98.2°F | Resp 18 | Ht 62.0 in | Wt 189.0 lb

## 2015-09-02 DIAGNOSIS — M542 Cervicalgia: Secondary | ICD-10-CM | POA: Diagnosis present

## 2015-09-02 DIAGNOSIS — M47818 Spondylosis without myelopathy or radiculopathy, sacral and sacrococcygeal region: Secondary | ICD-10-CM

## 2015-09-02 DIAGNOSIS — M546 Pain in thoracic spine: Secondary | ICD-10-CM | POA: Diagnosis present

## 2015-09-02 DIAGNOSIS — M47816 Spondylosis without myelopathy or radiculopathy, lumbar region: Secondary | ICD-10-CM | POA: Diagnosis not present

## 2015-09-02 DIAGNOSIS — M5126 Other intervertebral disc displacement, lumbar region: Secondary | ICD-10-CM | POA: Insufficient documentation

## 2015-09-02 DIAGNOSIS — M4806 Spinal stenosis, lumbar region: Secondary | ICD-10-CM | POA: Insufficient documentation

## 2015-09-02 DIAGNOSIS — M5481 Occipital neuralgia: Secondary | ICD-10-CM

## 2015-09-02 DIAGNOSIS — M533 Sacrococcygeal disorders, not elsewhere classified: Secondary | ICD-10-CM | POA: Diagnosis not present

## 2015-09-02 DIAGNOSIS — M961 Postlaminectomy syndrome, not elsewhere classified: Secondary | ICD-10-CM

## 2015-09-02 DIAGNOSIS — M5136 Other intervertebral disc degeneration, lumbar region: Secondary | ICD-10-CM | POA: Diagnosis not present

## 2015-09-02 DIAGNOSIS — M461 Sacroiliitis, not elsewhere classified: Secondary | ICD-10-CM

## 2015-09-02 DIAGNOSIS — M5030 Other cervical disc degeneration, unspecified cervical region: Secondary | ICD-10-CM | POA: Insufficient documentation

## 2015-09-02 DIAGNOSIS — M170 Bilateral primary osteoarthritis of knee: Secondary | ICD-10-CM | POA: Insufficient documentation

## 2015-09-02 DIAGNOSIS — Z9889 Other specified postprocedural states: Secondary | ICD-10-CM | POA: Insufficient documentation

## 2015-09-02 MED ORDER — DICLOFENAC POTASSIUM 50 MG PO TABS: ORAL_TABLET | ORAL | Status: DC

## 2015-09-02 MED ORDER — OXYCODONE HCL 10 MG PO TABS: ORAL_TABLET | ORAL | Status: DC

## 2015-09-02 NOTE — Patient Instructions (Addendum)
PLAN   Continue present medication diclofenac and oxycodone  Geniculate nerve blocks that need to be performed at time of return appointment  F/U PCP Dr. Allena Katz for evaliation of  BP and general medical  condition  F/U surgical evaluation. Surgical evaluation of knee as discussed  F/U neurological evaluation. May consider pending follow-up evaluations  May consider radiofrequency rhizolysis or intraspinal procedures pending response to present treatment and F/U evaluation   Patient to call Pain Management Center should patient have concerns prior to scheduled return appointment.Knee Injection A knee injection is a procedure to get medicine into your knee joint. Your health care provider puts a needle into the joint and injects medicine with an attached syringe. The injected medicine may relieve the pain, swelling, and stiffness of arthritis. The injected medicine may also help to lubricate and cushion your knee joint. You may need more than one injection. LET [facility] CARE PROVIDER KNOW ABOUT:  Any allergies you have.  All medicines you are taking, including vitamins, herbs, eye drops, creams, and over-the-counter medicines.  Previous problems you or members of your family have had with the use of anesthetics.  Any blood disorders you have.  Previous surgeries you have had.  Any medical conditions you may have. RISKS AND COMPLICATIONS Generally, this is a safe procedure. However, problems may occur, including:  Infection.  Bleeding.  Worsening symptoms.  Damage to the area around your knee.  Allergic reaction to any of the medicines.  Skin reactions from repeated injections. BEFORE THE PROCEDURE  Ask your health care provider about changing or stopping your regular medicines. This is especially important if you are taking diabetes medicines or blood thinners.  Plan to have someone take you home after the procedure. PROCEDURE  You will sit or lie down in a position  for your knee to be treated.  The skin over your kneecap will be cleaned with a germ-killing solution (antiseptic).  You will be given a medicine that numbs the area (local anesthetic). You may feel some stinging.  After your knee becomes numb, you will have a second injection. This is the medicine. This needle is carefully placed between your kneecap and your knee. The medicine is injected into the joint space.  At the end of the procedure, the needle will be removed.  A bandage (dressing) may be placed over the injection site. The procedure may vary among health care providers and hospitals. AFTER THE PROCEDURE  You may have to move your knee through its full range of motion. This helps to get all of the medicine into your joint space.  Your blood pressure, heart rate, breathing rate, and blood oxygen level will be monitored often until the medicines you were given have worn off.  You will be watched to make sure that you do not have a reaction to the injected medicine.   This information is not intended to replace advice given to you by your health care provider. Make sure you discuss any questions you have with your health care provider.   Document Released: 01/02/2007 Document Revised: 11/01/2014 Document Reviewed: 08/21/2014 Elsevier Interactive Patient Education 2016 [employer] GENERAL RISKS AND COMPLICATIONS  What are the risk, side effects and possible complications? Generally speaking, most procedures are safe.  However, with any procedure there are risks, side effects, and the possibility of complications.  The risks and complications are dependent upon the sites that are lesioned, or the type of nerve block to be performed.  The closer the procedure  is to the spine, the more serious the risks are.  Great care is taken when placing the radio frequency needles, block needles or lesioning probes, but sometimes complications can occur.  Infection: Any time there is an  injection through the skin, there is a risk of infection.  This is why sterile conditions are used for these blocks.  There are four possible types of infection.  Localized skin infection.  Central Nervous System Infection-This can be in the form of Meningitis, which can be deadly.  Epidural Infections-This can be in the form of an epidural abscess, which can cause pressure inside of the spine, causing compression of the spinal cord with subsequent paralysis. This would require an emergency surgery to decompress, and there are no guarantees that the patient would recover from the paralysis.  Discitis-This is an infection of the intervertebral discs.  It occurs in about 1% of discography procedures.  It is difficult to treat and it may lead to surgery.        2. Pain: the needles have to go through skin and soft tissues, will cause soreness.       3. Damage to internal structures:  The nerves to be lesioned may be near blood vessels or    other nerves which can be potentially damaged.       4. Bleeding: Bleeding is more common if the patient is taking blood thinners such as  aspirin, Coumadin, Ticiid, Plavix, etc., or if he/she have some genetic predisposition  such as hemophilia. Bleeding into the spinal canal can cause compression of the spinal  cord with subsequent paralysis.  This would require an emergency surgery to  decompress and there are no guarantees that the patient would recover from the  paralysis.       5. Pneumothorax:  Puncturing of a lung is a possibility, every time a needle is introduced in  the area of the chest or upper back.  Pneumothorax refers to free air around the  collapsed lung(s), inside of the thoracic cavity (chest cavity).  Another two possible  complications related to a similar event would include: Hemothorax and Chylothorax.   These are variations of the Pneumothorax, where instead of air around the collapsed  lung(s), you may have blood or chyle, respectively.        6. Spinal headaches: They may occur with any procedures in the area of the spine.       7. Persistent CSF (Cerebro-Spinal Fluid) leakage: This is a rare problem, but may occur  with prolonged intrathecal or epidural catheters either due to the formation of a fistulous  track or a dural tear.       8. Nerve damage: By working so close to the spinal cord, there is always a possibility of  nerve damage, which could be as serious as a permanent spinal cord injury with  paralysis.       9. Death:  Although rare, severe deadly allergic reactions known as "Anaphylactic  reaction" can occur to any of the medications used.      10. Worsening of the symptoms:  We can always make thing worse.  What are the chances of something like this happening? Chances of any of this occuring are extremely low.  By statistics, you have more of a chance of getting killed in a motor vehicle accident: while driving to the hospital than any of the above occurring .  Nevertheless, you should be aware that they are possibilities.  In general,  it is similar to taking a shower.  Everybody knows that you can slip, hit your head and get killed.  Does that mean that you should not shower again?  Nevertheless always keep in mind that statistics do not mean anything if you happen to be on the wrong side of them.  Even if a procedure has a 1 (one) in a 1,000,000 (million) chance of going wrong, it you happen to be that one..Also, keep in mind that by statistics, you have more of a chance of having something go wrong when taking medications.  Who should not have this procedure? If you are on a blood thinning medication (e.g. Coumadin, Plavix, see list of "Blood Thinners"), or if you have an active infection going on, you should not have the procedure.  If you are taking any blood thinners, please inform your physician.  How should I prepare for this procedure?  Do not eat or drink anything at least six hours prior to the procedure.  Bring  a driver with you .  It cannot be a taxi.  Come accompanied by an adult that can drive you back, and that is strong enough to help you if your legs get weak or numb from the local anesthetic.  Take all of your medicines the morning of the procedure with just enough water to swallow them.  If you have diabetes, make sure that you are scheduled to have your procedure done first thing in the morning, whenever possible.  If you have diabetes, take only half of your insulin dose and notify our nurse that you have done so as soon as you arrive at the clinic.  If you are diabetic, but only take blood sugar pills (oral hypoglycemic), then do not take them on the morning of your procedure.  You may take them after you have had the procedure.  Do not take aspirin or any aspirin-containing medications, at least eleven (11) days prior to the procedure.  They may prolong bleeding.  Wear loose fitting clothing that may be easy to take off and that you would not mind if it got stained with Betadine or blood.  Do not wear any jewelry or perfume  Remove any nail coloring.  It will interfere with some of our monitoring equipment.  NOTE: Remember that this is not meant to be interpreted as a complete list of all possible complications.  Unforeseen problems may occur.  BLOOD THINNERS The following drugs contain aspirin or other products, which can cause increased bleeding during surgery and should not be taken for 2 weeks prior to and 1 week after surgery.  If you should need take something for relief of minor pain, you may take acetaminophen which is found in Tylenol,m Datril, Anacin-3 and Panadol. It is not blood thinner. The products listed below are.  Do not take any of the products listed below in addition to any listed on your instruction sheet.  A.P.C or A.P.C with Codeine Codeine Phosphate Capsules #3 Ibuprofen Ridaura  ABC compound Congesprin Imuran rimadil  Advil Cope Indocin Robaxisal   Alka-Seltzer Effervescent Pain Reliever and Antacid Coricidin or Coricidin-D  Indomethacin Rufen  Alka-Seltzer plus Cold Medicine Cosprin Ketoprofen S-A-C Tablets  Anacin Analgesic Tablets or Capsules Coumadin Korlgesic Salflex  Anacin Extra Strength Analgesic tablets or capsules CP-2 Tablets Lanoril Salicylate  Anaprox Cuprimine Capsules Levenox Salocol  Anexsia-D Dalteparin Magan Salsalate  Anodynos Darvon compound Magnesium Salicylate Sine-off  Ansaid Dasin Capsules Magsal Sodium Salicylate  Anturane Depen Capsules Marnal Soma  APF Arthritis pain formula Dewitt's Pills Measurin Stanback  Argesic Dia-Gesic Meclofenamic Sulfinpyrazone  Arthritis Bayer Timed Release Aspirin Diclofenac Meclomen Sulindac  Arthritis pain formula Anacin Dicumarol Medipren Supac  Analgesic (Safety coated) Arthralgen Diffunasal Mefanamic Suprofen  Arthritis Strength Bufferin Dihydrocodeine Mepro Compound Suprol  Arthropan liquid Dopirydamole Methcarbomol with Aspirin Synalgos  ASA tablets/Enseals Disalcid Micrainin Tagament  Ascriptin Doan's Midol Talwin  Ascriptin A/D Dolene Mobidin Tanderil  Ascriptin Extra Strength Dolobid Moblgesic Ticlid  Ascriptin with Codeine Doloprin or Doloprin with Codeine Momentum Tolectin  Asperbuf Duoprin Mono-gesic Trendar  Aspergum Duradyne Motrin or Motrin IB Triminicin  Aspirin plain, buffered or enteric coated Durasal Myochrisine Trigesic  Aspirin Suppositories Easprin Nalfon Trillsate  Aspirin with Codeine Ecotrin Regular or Extra Strength Naprosyn Uracel  Atromid-S Efficin Naproxen Ursinus  Auranofin Capsules Elmiron Neocylate Vanquish  Axotal Emagrin Norgesic Verin  Azathioprine Empirin or Empirin with Codeine Normiflo Vitamin E  Azolid Emprazil Nuprin Voltaren  Bayer Aspirin plain, buffered or children's or timed BC Tablets or powders Encaprin Orgaran Warfarin Sodium  Buff-a-Comp Enoxaparin Orudis Zorpin  Buff-a-Comp with Codeine Equegesic Os-Cal-Gesic   Buffaprin  Excedrin plain, buffered or Extra Strength Oxalid   Bufferin Arthritis Strength Feldene Oxphenbutazone   Bufferin plain or Extra Strength Feldene Capsules Oxycodone with Aspirin   Bufferin with Codeine Fenoprofen Fenoprofen Pabalate or Pabalate-SF   Buffets II Flogesic Panagesic   Buffinol plain or Extra Strength Florinal or Florinal with Codeine Panwarfarin   Buf-Tabs Flurbiprofen Penicillamine   Butalbital Compound Four-way cold tablets Penicillin   Butazolidin Fragmin Pepto-Bismol   Carbenicillin Geminisyn Percodan   Carna Arthritis Reliever Geopen Persantine   Carprofen Gold's salt Persistin   Chloramphenicol Goody's Phenylbutazone   Chloromycetin Haltrain Piroxlcam   Clmetidine heparin Plaquenil   Cllnoril Hyco-pap Ponstel   Clofibrate Hydroxy chloroquine Propoxyphen         Before stopping any of these medications, be sure to consult the physician who ordered them.  Some, such as Coumadin (Warfarin) are ordered to prevent or treat serious conditions such as "deep thrombosis", "pumonary embolisms", and other heart problems.  The amount of time that you may need off of the medication may also vary with the medication and the reason for which you were taking it.  If you are taking any of these medications, please make sure you notify your pain physician before you undergo any procedures.

## 2015-09-02 NOTE — Progress Notes (Signed)
Safety precautions to be maintained throughout the outpatient stay will include: orient to surroundings, keep bed in low position, maintain call bell within reach at all times, provide assistance with transfer out of bed and ambulation.  

## 2015-09-02 NOTE — Progress Notes (Signed)
   Subjective:    Patient ID: Cathy MurdochPatricia A Foley, female    DOB: 05/25/1976, 39 y.o.   MRN: [id number]  HPI  The patient is a 39 year old female who returns to pain management for further evaluation and treatment of pain involving the neck and Back upper or lower extremity regions with pain involving the region of the right knee especially at this time. Undergone surgical evaluation and Plans for surgical intervention. The patient has been advised to return to pain clinic for further evaluation and treatment of pain involving the right knee. We discussed patient's condition on today's visit and we'll proceed with nerve blocks of the knee at time return appointment in attempt to decrease severity of symptoms, minimize progression of symptoms, and avoid knee from her infarct treatment. The patient will continue diclofenac and oxycodone as prescribed. The patient was understanding and agreed with suggested treatment plan       Review of Systems     Objective:   Physical Exam There was mild tenderness of the splenius capitis and a separate talus musculature regions. There appeared to be unremarkable Spurling's maneuver. Patient appeared to be with bilaterally equal grip strength and Tinel and Phalen's maneuver but without increase of pain of significant degree. Patient over the cervical facet cervical paraspinal musculature region and thoracic facet and thoracic paraspinal musculature reproduces mild discomfort. Palpation over the lumbar paraspinal muscles lumbar facet region was attends to palpation of mild to moderate degree. There was moderate tension to palpation of the greater trochanteric region and iliotibial band region. Straight leg raising was limited to 20 without increase of pain with dorsiflexion noted. No definite sensory deficit dermatomal distribution was detected. There was negative clonus negative Homans. There was mild tightness to palpation of the PSIS and PIIS regions. He revealed  patient to be with crepitus of the knee. There was negative anterior and posterior signs and no ballottement of the patella was noted. Range of motion maneuvers of the knee reproduces severe pain. Abdomen was nontender with no costovertebral tenderness noted.       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, degenerative changes lumbar spine with stenosis and moderate bilateral foraminal stenosis, L3-4 degenerative disc disease and facet changes with diffuse disc bulging and disc material abutting the L3 nerve root in the foraminal zone, L4-5 degenerative disc disease, diffuse facet changes noted throughout the lumbar spine  Lumbar facet syndrome  Degenerative joint disease of knees  Sacroiliac joint dysfunction  Degenerative disc disease cervical spine      PLAN   Continue present medication diclofenac and oxycodone  Geniculate nerve blocks of the need to be performed at time return appointment  F/U PCP Dr. Allena Foley for evaliation of  BP and general medical  condition  F/U surgical evaluation. The patient is to undergo reevaluation of knee as discussed pending response to treatment and follow-up evaluation  F/U neurological evaluation. May consider pending follow-up evaluations  May consider radiofrequency rhizolysis or intraspinal procedures pending response to present treatment and F/U evaluation   Patient to call Pain Management Center should patient have concerns prior to scheduled return appointment.

## 2015-09-11 ENCOUNTER — Encounter: Payer: Medicaid Other | Admitting: Pain Medicine

## 2015-09-12 ENCOUNTER — Other Ambulatory Visit: Payer: Self-pay | Admitting: Pain Medicine

## 2015-09-15 ENCOUNTER — Ambulatory Visit: Payer: Medicaid Other | Attending: Pain Medicine | Admitting: Pain Medicine

## 2015-09-15 ENCOUNTER — Encounter: Payer: Self-pay | Admitting: Pain Medicine

## 2015-09-15 ENCOUNTER — Other Ambulatory Visit: Payer: Self-pay | Admitting: Pain Medicine

## 2015-09-15 VITALS — BP 106/74 | HR 72 | Temp 98.2°F | Resp 18 | Ht 62.0 in | Wt 183.0 lb

## 2015-09-15 DIAGNOSIS — M961 Postlaminectomy syndrome, not elsewhere classified: Secondary | ICD-10-CM

## 2015-09-15 DIAGNOSIS — M545 Low back pain: Secondary | ICD-10-CM | POA: Diagnosis present

## 2015-09-15 DIAGNOSIS — M47816 Spondylosis without myelopathy or radiculopathy, lumbar region: Secondary | ICD-10-CM

## 2015-09-15 DIAGNOSIS — M5030 Other cervical disc degeneration, unspecified cervical region: Secondary | ICD-10-CM

## 2015-09-15 DIAGNOSIS — M179 Osteoarthritis of knee, unspecified: Secondary | ICD-10-CM | POA: Insufficient documentation

## 2015-09-15 DIAGNOSIS — M5136 Other intervertebral disc degeneration, lumbar region: Secondary | ICD-10-CM

## 2015-09-15 DIAGNOSIS — M5481 Occipital neuralgia: Secondary | ICD-10-CM

## 2015-09-15 DIAGNOSIS — M170 Bilateral primary osteoarthritis of knee: Secondary | ICD-10-CM

## 2015-09-15 DIAGNOSIS — M47818 Spondylosis without myelopathy or radiculopathy, sacral and sacrococcygeal region: Secondary | ICD-10-CM

## 2015-09-15 DIAGNOSIS — M25561 Pain in right knee: Secondary | ICD-10-CM | POA: Diagnosis present

## 2015-09-15 DIAGNOSIS — M533 Sacrococcygeal disorders, not elsewhere classified: Secondary | ICD-10-CM

## 2015-09-15 DIAGNOSIS — M461 Sacroiliitis, not elsewhere classified: Secondary | ICD-10-CM

## 2015-09-15 MED ORDER — CEFUROXIME AXETIL 250 MG PO TABS: 250.0000 mg | ORAL_TABLET | Freq: Two times a day (BID) | ORAL | Status: DC

## 2015-09-15 MED ORDER — LIDOCAINE HCL (PF) 1 % IJ SOLN: 10.0000 mL | Freq: Once | INTRAMUSCULAR | Status: DC

## 2015-09-15 MED ORDER — LACTATED RINGERS IV SOLN: 1000.0000 mL | INTRAVENOUS | Status: DC

## 2015-09-15 MED ORDER — CEFAZOLIN SODIUM 1-5 GM-% IV SOLN: 1.0000 g | Freq: Once | INTRAVENOUS | Status: DC

## 2015-09-15 MED ORDER — ORPHENADRINE CITRATE 30 MG/ML IJ SOLN
INTRAMUSCULAR | Status: AC
  Filled 2015-09-15: qty 2

## 2015-09-15 MED ORDER — MIDAZOLAM HCL 5 MG/5ML IJ SOLN: 5.0000 mg | Freq: Once | INTRAMUSCULAR | Status: DC

## 2015-09-15 MED ORDER — FENTANYL CITRATE (PF) 100 MCG/2ML IJ SOLN: 100.0000 ug | Freq: Once | INTRAMUSCULAR | Status: DC

## 2015-09-15 MED ORDER — TRIAMCINOLONE ACETONIDE 40 MG/ML IJ SUSP
INTRAMUSCULAR | Status: AC
  Administered 2015-09-15: 40 mg
  Filled 2015-09-15: qty 1

## 2015-09-15 MED ORDER — TRIAMCINOLONE ACETONIDE 40 MG/ML IJ SUSP: 40.0000 mg | Freq: Once | INTRAMUSCULAR | Status: DC

## 2015-09-15 MED ORDER — BUPIVACAINE HCL (PF) 0.25 % IJ SOLN
INTRAMUSCULAR | Status: AC
  Administered 2015-09-15: 30 mL
  Filled 2015-09-15: qty 30

## 2015-09-15 MED ORDER — BUPIVACAINE HCL (PF) 0.25 % IJ SOLN: 30.0000 mL | Freq: Once | INTRAMUSCULAR | Status: DC

## 2015-09-15 NOTE — Patient Instructions (Addendum)
PLAN   Continue present medication diclofenac and oxycodone. Please obtain Ceftin antibiotic today and begin taking Ceftin antibiotic today as prescribed  F/U PCP Dr. Allena Katz and Seward Carol for evaliation of  BP and general medical  condition  F/U surgical evaluation. May consider pending follow-up evaluations  F/U neurological evaluation. May consider pending follow-up evaluations  May consider radiofrequency rhizolysis or intraspinal procedures pending response to present treatment and F/U evaluation   Patient to call Pain Management Center should patient have concerns prior to scheduled return appointment.Knee Injection A knee injection is a procedure to get medicine into your knee joint. Your health care provider puts a needle into the joint and injects medicine with an attached syringe. The injected medicine may relieve the pain, swelling, and stiffness of arthritis. The injected medicine may also help to lubricate and cushion your knee joint. You may need more than one injection. LET [facility] CARE PROVIDER KNOW ABOUT:  Any allergies you have.  All medicines you are taking, including vitamins, herbs, eye drops, creams, and over-the-counter medicines.  Previous problems you or members of your family have had with the use of anesthetics.  Any blood disorders you have.  Previous surgeries you have had.  Any medical conditions you may have. RISKS AND COMPLICATIONS Generally, this is a safe procedure. However, problems may occur, including:  Infection.  Bleeding.  Worsening symptoms.  Damage to the area around your knee.  Allergic reaction to any of the medicines.  Skin reactions from repeated injections. BEFORE THE PROCEDURE  Ask your health care provider about changing or stopping your regular medicines. This is especially important if you are taking diabetes medicines or blood thinners.  Plan to have someone take you home after the procedure. PROCEDURE  You will sit or  lie down in a position for your knee to be treated.  The skin over your kneecap will be cleaned with a germ-killing solution (antiseptic).  You will be given a medicine that numbs the area (local anesthetic). You may feel some stinging.  After your knee becomes numb, you will have a second injection. This is the medicine. This needle is carefully placed between your kneecap and your knee. The medicine is injected into the joint space.  At the end of the procedure, the needle will be removed.  A bandage (dressing) may be placed over the injection site. The procedure may vary among health care providers and hospitals. AFTER THE PROCEDURE  You may have to move your knee through its full range of motion. This helps to get all of the medicine into your joint space.  Your blood pressure, heart rate, breathing rate, and blood oxygen level will be monitored often until the medicines you were given have worn off.  You will be watched to make sure that you do not have a reaction to the injected medicine.   This information is not intended to replace advice given to you by your health care provider. Make sure you discuss any questions you have with your health care provider.   Document Released: 01/02/2007 Document Revised: 11/01/2014 Document Reviewed: 08/21/2014 Elsevier Interactive Patient Education 2016 [employer] Pain Management Discharge Instructions  General Discharge Instructions :  If you need to reach your doctor call: Monday-Friday 8:00 am - 4:00 pm at [phone] or toll free [phone].  After clinic hours [phone] to have operator reach doctor.  Bring all of your medication bottles to all your appointments in the pain clinic.  To cancel or reschedule your appointment  with Pain Management please remember to call 24 hours in advance to avoid a fee.  Refer to the educational materials which you have been given on: General Risks, I had my Procedure. Discharge  Instructions, Post Sedation.  Post Procedure Instructions:  The drugs you were given will stay in your system until tomorrow, so for the next 24 hours you should not drive, make any legal decisions or drink any alcoholic beverages.  You may eat anything you prefer, but it is better to start with liquids then soups and crackers, and gradually work up to solid foods.  Please notify your doctor immediately if you have any unusual bleeding, trouble breathing or pain that is not related to your normal pain.  Depending on the type of procedure that was done, some parts of your body may feel week and/or numb.  This usually clears up by tonight or the next day.  Walk with the use of an assistive device or accompanied by an adult for the 24 hours.  You may use ice on the affected area for the first 24 hours.  Put ice in a Ziploc bag and cover with a towel and place against area 15 minutes on 15 minutes off.  You may switch to heat after 24 hours.

## 2015-09-15 NOTE — Progress Notes (Signed)
Subjective:    Patient ID: Cathy MurdochPatricia A Foley, female    DOB: 04/09/1976, 39 y.o.   MRN: [id number]  HPI  Geniculate nerve blocks of the right knee   The patient is a 39 y.o. female who [facility] for further evaluation and treatment of pain involving the lumbar lower extremity region with severe pain of the right knee. Prior studies reveal patient to be with significant degenerative joint disease of the knee.  We will proceed with geniculate nerve blocks of the right knee in an attempt to decrease severity of symptoms, minimize the risk of medication escalation, hopefully retard progression of symptoms and avoid the need for more involved treatment.  The risks benefits and expectations of the procedure were discussed with the patient. The patient was with understanding and in agreement with suggested treatment plan.  DESCRIPTION OF PROCEDURE: Geniculate nerve blocks of the right knee. The  procedure was performed with local anesthesia and under fluoroscopic guidance.  NEEDLE PLACEMENT FOR BLOCK OF THE LATERAL SUPERIOR GENICULATE NERVE: The patient was taking to the fluoroscopy suite. With the patient supine, with knee in flexed position, Betadine prep of proposed entry site accomplished.  EKG, blood pressure, pulse and pulse oximetry monitoring were all in place. Under fluoroscopic guidance, a 22-gauge needle was inserted in the region of the right knee with needle placed at the lateral border of the femur at the junction of the shaft of the femur and the condyle of the femur.  Following needle placement at the lateral aspect of the knee, needle placement was then accomplished in the region of the medial aspect of the knee.  NEEDLE PLACEMENT FOR BLOCK OF THE MEDIAL SUPERIOR GENICULATE NERVE:  Under fluoroscopic guidance, a 22 - gauge needle was inserted in the region of the right knee with needle placed at the medial border of the femur at the junction of the shaft of the  femur and the condyle of the femur.   NEEDLE PLACEMENT FOR BLOCK OF THE MEDIAL INFERIOR GENICULATE NERVE:  Under fluoroscopic guidance, a 22 - gauge needle was inserted in the region of the right knee with needle placed at the junction of the shaft and plateau of the tibia.   Following needle placement on AP view of needles placed in all three locations, placement was then verified on lateral view with the tips of the superior lateral and superior medial needles documented to be one half the distance of the shaft of the femur and the tip of the inferior medial geniculate needle documented to be one half the distance of the shaft of the tibia.  Following documentation of needle placements on lateral view, each needle was injected with one mL of 0.25% bupivacaine with Kenalog. A total of 10 mg of Kenalog was utilized for the entire procedure. The patient tolerated the procedure well.    PLAN 1. Medications: Continue present medications oxycodone and diclofenac 2. Follow-up appointment with PCP [name] and [name] for evaluation of blood pressure and general medical condition. 3. Follow-up surgical evaluation patient will undergo further evaluation as discussed 4. Follow-up neurological evaluation 5. He patient may be a candidate for radiofrequency rhizolysis and other treatment pending response to treatment on today's visit and follow-up evaluation. 6. The patient is advised to adhere to proper body mechanics and avoid activities which appear to aggravate condition      Review of Systems     Objective:   Physical Exam  Assessment & Plan:

## 2015-09-16 ENCOUNTER — Telehealth: Payer: Self-pay | Admitting: *Deleted

## 2015-09-16 NOTE — Telephone Encounter (Signed)
Spoke with patient, verbalizes no complications from procedure on yesterday. 

## 2015-09-25 ENCOUNTER — Other Ambulatory Visit: Payer: Medicaid Other | Admitting: Urology

## 2015-10-02 ENCOUNTER — Ambulatory Visit: Payer: Medicaid Other | Attending: Pain Medicine | Admitting: Pain Medicine

## 2015-10-02 ENCOUNTER — Encounter: Payer: Self-pay | Admitting: Pain Medicine

## 2015-10-02 VITALS — BP 122/87 | HR 103 | Temp 97.9°F | Resp 15

## 2015-10-02 DIAGNOSIS — M47816 Spondylosis without myelopathy or radiculopathy, lumbar region: Secondary | ICD-10-CM

## 2015-10-02 DIAGNOSIS — M533 Sacrococcygeal disorders, not elsewhere classified: Secondary | ICD-10-CM

## 2015-10-02 DIAGNOSIS — M47818 Spondylosis without myelopathy or radiculopathy, sacral and sacrococcygeal region: Secondary | ICD-10-CM

## 2015-10-02 DIAGNOSIS — M5481 Occipital neuralgia: Secondary | ICD-10-CM | POA: Insufficient documentation

## 2015-10-02 DIAGNOSIS — M4806 Spinal stenosis, lumbar region: Secondary | ICD-10-CM | POA: Diagnosis not present

## 2015-10-02 DIAGNOSIS — Z9889 Other specified postprocedural states: Secondary | ICD-10-CM | POA: Insufficient documentation

## 2015-10-02 DIAGNOSIS — M5136 Other intervertebral disc degeneration, lumbar region: Secondary | ICD-10-CM | POA: Diagnosis not present

## 2015-10-02 DIAGNOSIS — G43909 Migraine, unspecified, not intractable, without status migrainosus: Secondary | ICD-10-CM | POA: Diagnosis not present

## 2015-10-02 DIAGNOSIS — M461 Sacroiliitis, not elsewhere classified: Secondary | ICD-10-CM

## 2015-10-02 DIAGNOSIS — M5030 Other cervical disc degeneration, unspecified cervical region: Secondary | ICD-10-CM | POA: Diagnosis not present

## 2015-10-02 DIAGNOSIS — M961 Postlaminectomy syndrome, not elsewhere classified: Secondary | ICD-10-CM

## 2015-10-02 DIAGNOSIS — M170 Bilateral primary osteoarthritis of knee: Secondary | ICD-10-CM | POA: Insufficient documentation

## 2015-10-02 DIAGNOSIS — G43109 Migraine with aura, not intractable, without status migrainosus: Secondary | ICD-10-CM

## 2015-10-02 DIAGNOSIS — R51 Headache: Secondary | ICD-10-CM | POA: Diagnosis present

## 2015-10-02 MED ORDER — OXYCODONE HCL 10 MG PO TABS: ORAL_TABLET | ORAL | Status: DC

## 2015-10-02 NOTE — Progress Notes (Signed)
Safety precautions to be maintained throughout the outpatient stay will include: orient to surroundings, keep bed in low position, maintain call bell within reach at all times, provide assistance with transfer out of bed and ambulation.  

## 2015-10-02 NOTE — Progress Notes (Signed)
Subjective:    Patient ID: [name], female    DOB: 04/28/1976, 39 y.o.   MRN: [id number]  HPI The patient is a 39 year old female who returns to [facility] for further evaluation and treatment of headaches with component of migraine headache as well as concern regarding greater occipital neuralgia and myofascial pain related headaches as well as pain involving the cervical region thoracic lumbar lower extremity region and knees. The patient states that she has significant headaches with headaches felt to be component of migraine headache. We discussed patient's condition and will consider patient for sphenopalatine ganglion block to be performed at time return appointment. He'll also undergo surgical evaluation of knee as planned and we will remain available to consider modification of treatment regimen as discussed. Patient denies trauma change in events of daily living the call significant change in symptomatology. Patient will continue diclofenac and oxycodone as prescribed this time. Patient will return for sphenopalatine ganglion block in attempt to decrease severity of migraine headaches as well as frequency of migraine headache and we will consider additional modifications of treatment regimen as discussed and as explained to patient on today's visit. Patient also has received Maxalt from [name] for treatment of her migraine. The patient wishes to minimize the use of Maxalt. We will proceed with feel pontine ganglion block in the attempt to decrease the knee to consider Maxalt as well as other medications for treatment of migraine headache. All agreed to suggested treatment plan      Review of Systems     Objective:   Physical Exam  There was tends to palpation of paraspinal muscles and cervical and cervical facet region with moderate tends to palpation over the cervical facet cervical paraspinal musculature region. There was moderate tensed palpation of the splenius  capitis and occipitalis musculature region. No new masses of the head and neck were noted. No bounding pulsations of the temporal region were noted. There was mild tenderness of the temporomandibular joint region. Palpation over the thoracic facet thoracic paraspinal musculature region was attends to palpation of moderate degree No crepitus of the thoracic region was noted.. Palpation over the lumbar paraspinal muscles lumbar facet region was tends to palpation of mild to moderate degree. There was mild to moderate tenderness of the PSIS and PII S region as well as the gluteal and piriformis musculature region. Straight leg raise was tolerates approximately 30 without increased pain of the dorsiflexion noted. Knees were attends palpation and crepitus of the knees with negative anterior and posterior drawer signs without ballottement of the patella. EHL strength appeared to be decreased. No definite sensory deficit or dermatomal distribution detected. Abdomen nontender with no costovertebral tenderness noted.      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, degenerative changes lumbar spine with stenosis and moderate bilateral foraminal stenosis, L3-4 degenerative disc disease and facet changes with diffuse disc bulging and disc material abutting the L3 nerve root in the foraminal zone, L4-5 degenerative disc disease, diffuse facet changes noted throughout the lumbar spine  Lumbar facet syndrome  Degenerative joint disease of knees  Sacroiliac joint dysfunction  Degenerative disc disease cervical spine    PLAN   Continue present medication diclofenac and oxycodone  Sphenopalatine ganglion block to be performed at time return appointment  F/U PCP [name] for evaliation of  BP and general medical  condition  F/U surgical evaluation. Surgical evaluation  of knee as discussed  F/U  neurological evaluation. May consider pending follow-up evaluations  F/U psych evaluation as discussed  May consider radiofrequency rhizolysis or intraspinal procedures pending response to present treatment and F/U evaluation   Patient to call [facility] should patient have concerns prior to scheduled return appointment

## 2015-10-02 NOTE — Patient Instructions (Addendum)
PLAN   Continue present medication diclofenac and oxycodone  Sphenopalatine ganglion block to be performed at time return appointment  F/U PCP [name] for evaliation of  BP and general medical  condition  F/U surgical evaluation. Surgical evaluation of knee as discussed  F/U neurological evaluation. May consider pending follow-up evaluations  F/U psych evaluation as discussed  May consider radiofrequency rhizolysis or intraspinal procedures pending response to present treatment and F/U evaluation   Patient to call [facility] should patient have concerns prior to scheduled return appointmentGENERAL RISKS AND COMPLICATIONS  What are the risk, side effects and possible complications? Generally speaking, most procedures are safe.  However, with any procedure there are risks, side effects, and the possibility of complications.  The risks and complications are dependent upon the sites that are lesioned, or the type of nerve block to be performed.  The closer the procedure is to the spine, the more serious the risks are.  Great care is taken when placing the radio frequency needles, block needles or lesioning probes, but sometimes complications can occur. 1. Infection: Any time there is an injection through the skin, there is a risk of infection.  This is why sterile conditions are used for these blocks.  There are four possible types of infection. 1. Localized skin infection. 2. Central Nervous System Infection-This can be in the form of Meningitis, which can be deadly. 3. Epidural Infections-This can be in the form of an epidural abscess, which can cause pressure inside of the spine, causing compression of the spinal cord with subsequent paralysis. This would require an emergency surgery to decompress, and there are no guarantees that the patient would recover from the paralysis. 4. Discitis-This is an infection of the intervertebral discs.  It occurs in about 1% of discography  procedures.  It is difficult to treat and it may lead to surgery.        2. Pain: the needles have to go through skin and soft tissues, will cause soreness.       3. Damage to internal structures:  The nerves to be lesioned may be near blood vessels or    other nerves which can be potentially damaged.       4. Bleeding: Bleeding is more common if the patient is taking blood thinners such as  aspirin, Coumadin, Ticiid, Plavix, etc., or if he/she have some genetic predisposition  such as hemophilia. Bleeding into the spinal canal can cause compression of the spinal  cord with subsequent paralysis.  This would require an emergency surgery to  decompress and there are no guarantees that the patient would recover from the  paralysis.       5. Pneumothorax:  Puncturing of a lung is a possibility, every time a needle is introduced in  the area of the chest or upper back.  Pneumothorax refers to free air around the  collapsed lung(s), inside of the thoracic cavity (chest cavity).  Another two possible  complications related to a similar event would include: Hemothorax and Chylothorax.   These are variations of the Pneumothorax, where instead of air around the collapsed  lung(s), you may have blood or chyle, respectively.       6. Spinal headaches: They may occur with any procedures in the area of the spine.       7. Persistent CSF (Cerebro-Spinal Fluid) leakage: This is a rare problem, but may occur  with prolonged intrathecal or epidural catheters either due to the formation of a  fistulous  track or a dural tear.       8. Nerve damage: By working so close to the spinal cord, there is always a possibility of  nerve damage, which could be as serious as a permanent spinal cord injury with  paralysis.       9. Death:  Although rare, severe deadly allergic reactions known as "Anaphylactic  reaction" can occur to any of the medications used.      10. Worsening of the symptoms:  We can always make thing worse.  What  are the chances of something like this happening? Chances of any of this occuring are extremely low.  By statistics, you have more of a chance of getting killed in a motor vehicle accident: while driving to the hospital than any of the above occurring .  Nevertheless, you should be aware that they are possibilities.  In general, it is similar to taking a shower.  Everybody knows that you can slip, hit your head and get killed.  Does that mean that you should not shower again?  Nevertheless always keep in mind that statistics do not mean anything if you happen to be on the wrong side of them.  Even if a procedure has a 1 (one) in a 1,000,000 (million) chance of going wrong, it you happen to be that one..Also, keep in mind that by statistics, you have more of a chance of having something go wrong when taking medications.  Who should not have this procedure? If you are on a blood thinning medication (e.g. Coumadin, Plavix, see list of "Blood Thinners"), or if you have an active infection going on, you should not have the procedure.  If you are taking any blood thinners, please inform your physician.  How should I prepare for this procedure?  Do not eat or drink anything at least six hours prior to the procedure.  Bring a driver with you .  It cannot be a taxi.  Come accompanied by an adult that can drive you back, and that is strong enough to help you if your legs get weak or numb from the local anesthetic.  Take all of your medicines the morning of the procedure with just enough water to swallow them.  If you have diabetes, make sure that you are scheduled to have your procedure done first thing in the morning, whenever possible.  If you have diabetes, take only half of your insulin dose and notify our nurse that you have done so as soon as you arrive at the clinic.  If you are diabetic, but only take blood sugar pills (oral hypoglycemic), then do not take them on the morning of your procedure.   You may take them after you have had the procedure.  Do not take aspirin or any aspirin-containing medications, at least eleven (11) days prior to the procedure.  They may prolong bleeding.  Wear loose fitting clothing that may be easy to take off and that you would not mind if it got stained with Betadine or blood.  Do not wear any jewelry or perfume  Remove any nail coloring.  It will interfere with some of our monitoring equipment.  NOTE: Remember that this is not meant to be interpreted as a complete list of all possible complications.  Unforeseen problems may occur.  BLOOD THINNERS The following drugs contain aspirin or other products, which can cause increased bleeding during surgery and should not be taken for 2 weeks prior to and 1 week  after surgery.  If you should need take something for relief of minor pain, you may take acetaminophen which is found in Tylenol,m Datril, Anacin-3 and Panadol. It is not blood thinner. The products listed below are.  Do not take any of the products listed below in addition to any listed on your instruction sheet.  A.P.C or A.P.C with Codeine Codeine Phosphate Capsules #3 Ibuprofen Ridaura  ABC compound Congesprin Imuran rimadil  Advil Cope Indocin Robaxisal  Alka-Seltzer Effervescent Pain Reliever and Antacid Coricidin or Coricidin-D  Indomethacin Rufen  Alka-Seltzer plus Cold Medicine Cosprin Ketoprofen S-A-C Tablets  Anacin Analgesic Tablets or Capsules Coumadin Korlgesic Salflex  Anacin Extra Strength Analgesic tablets or capsules CP-2 Tablets Lanoril Salicylate  Anaprox Cuprimine Capsules Levenox Salocol  Anexsia-D Dalteparin Magan Salsalate  Anodynos Darvon compound Magnesium Salicylate Sine-off  Ansaid Dasin Capsules Magsal Sodium Salicylate  Anturane Depen Capsules Marnal Soma  APF Arthritis pain formula Dewitt's Pills Measurin Stanback  Argesic Dia-Gesic Meclofenamic Sulfinpyrazone  Arthritis Bayer Timed Release Aspirin Diclofenac  Meclomen Sulindac  Arthritis pain formula Anacin Dicumarol Medipren Supac  Analgesic (Safety coated) Arthralgen Diffunasal Mefanamic Suprofen  Arthritis Strength Bufferin Dihydrocodeine Mepro Compound Suprol  Arthropan liquid Dopirydamole Methcarbomol with Aspirin Synalgos  ASA tablets/Enseals Disalcid Micrainin Tagament  Ascriptin Doan's Midol Talwin  Ascriptin A/D Dolene Mobidin Tanderil  Ascriptin Extra Strength Dolobid Moblgesic Ticlid  Ascriptin with Codeine Doloprin or Doloprin with Codeine Momentum Tolectin  Asperbuf Duoprin Mono-gesic Trendar  Aspergum Duradyne Motrin or Motrin IB Triminicin  Aspirin plain, buffered or enteric coated Durasal Myochrisine Trigesic  Aspirin Suppositories Easprin Nalfon Trillsate  Aspirin with Codeine Ecotrin Regular or Extra Strength Naprosyn Uracel  Atromid-S Efficin Naproxen Ursinus  Auranofin Capsules Elmiron Neocylate Vanquish  Axotal Emagrin Norgesic Verin  Azathioprine Empirin or Empirin with Codeine Normiflo Vitamin E  Azolid Emprazil Nuprin Voltaren  Bayer Aspirin plain, buffered or children's or timed BC Tablets or powders Encaprin Orgaran Warfarin Sodium  Buff-a-Comp Enoxaparin Orudis Zorpin  Buff-a-Comp with Codeine Equegesic Os-Cal-Gesic   Buffaprin Excedrin plain, buffered or Extra Strength Oxalid   Bufferin Arthritis Strength Feldene Oxphenbutazone   Bufferin plain or Extra Strength Feldene Capsules Oxycodone with Aspirin   Bufferin with Codeine Fenoprofen Fenoprofen Pabalate or Pabalate-SF   Buffets II Flogesic Panagesic   Buffinol plain or Extra Strength Florinal or Florinal with Codeine Panwarfarin   Buf-Tabs Flurbiprofen Penicillamine   Butalbital Compound Four-way cold tablets Penicillin   Butazolidin Fragmin Pepto-Bismol   Carbenicillin Geminisyn Percodan   Carna Arthritis Reliever Geopen Persantine   Carprofen Gold's salt Persistin   Chloramphenicol Goody's Phenylbutazone   Chloromycetin Haltrain Piroxlcam   Clmetidine  heparin Plaquenil   Cllnoril Hyco-pap Ponstel   Clofibrate Hydroxy chloroquine Propoxyphen         Before stopping any of these medications, be sure to consult the physician who ordered them.  Some, such as Coumadin (Warfarin) are ordered to prevent or treat serious conditions such as "deep thrombosis", "pumonary embolisms", and other heart problems.  The amount of time that you may need off of the medication may also vary with the medication and the reason for which you were taking it.  If you are taking any of these medications, please make sure you notify your pain physician before you undergo any procedures.

## 2015-10-06 ENCOUNTER — Ambulatory Visit (INDEPENDENT_AMBULATORY_CARE_PROVIDER_SITE_OTHER): Payer: Medicaid Other | Admitting: Urology

## 2015-10-06 ENCOUNTER — Ambulatory Visit
Admission: RE | Admit: 2015-10-06 | Discharge: 2015-10-06 | Disposition: A | Discharge status: Home / Self Care | Payer: Medicaid Other | Source: Ambulatory Visit | Attending: Urology | Admitting: Urology

## 2015-10-06 VITALS — BP 118/82 | HR 94 | Ht 62.0 in | Wt 198.4 lb

## 2015-10-06 DIAGNOSIS — Z87442 Personal history of urinary calculi: Secondary | ICD-10-CM | POA: Insufficient documentation

## 2015-10-06 DIAGNOSIS — M6289 Other specified disorders of muscle: Secondary | ICD-10-CM

## 2015-10-06 DIAGNOSIS — N941 Unspecified dyspareunia: Secondary | ICD-10-CM

## 2015-10-06 DIAGNOSIS — N8184 Pelvic muscle wasting: Secondary | ICD-10-CM | POA: Diagnosis not present

## 2015-10-06 DIAGNOSIS — N3946 Mixed incontinence: Secondary | ICD-10-CM | POA: Diagnosis not present

## 2015-10-06 LAB — MICROSCOPIC EXAMINATION: Epithelial Cells (non renal): >10 /hpf — ABNORMAL HIGH (ref 0–10)

## 2015-10-06 LAB — URINALYSIS, COMPLETE
BILIRUBIN UA: NEGATIVE
LEUKOCYTES UA: NEGATIVE
NITRITE UA: NEGATIVE
Protein, UA: NEGATIVE
Urobilinogen, Ur: 0.2 mg/dL (ref 0.2–1.0)
pH, UA: 5.5 (ref 5.0–7.5)

## 2015-10-06 MED ORDER — MIRABEGRON ER 25 MG PO TB24: 25.0000 mg | ORAL_TABLET | Freq: Every day | ORAL | Status: DC

## 2015-10-06 MED ORDER — CIPROFLOXACIN HCL 500 MG PO TABS
500.0000 mg | ORAL_TABLET | Freq: Once | ORAL | Status: AC
  Administered 2015-10-06: 500 mg via ORAL

## 2015-10-06 MED ORDER — LIDOCAINE HCL 2 % EX GEL
1.0000 "application " | Freq: Once | CUTANEOUS | Status: AC
  Administered 2015-10-06: 1 via URETHRAL

## 2015-10-06 NOTE — Progress Notes (Signed)
10/06/2015 6:05 PM   [name] 06-22-1976 [id number]  Referring provider: [facility] [address] [place], [place] [address]  Chief Complaint  Patient presents with  . Cysto    HPI: 39 year old female with urinary frequency, pelvic pain, dyspareunia who presents to the office for cystoscopy, pelvic exam for further evaluation of her symptoms.  She's tried several anticholinergics without improvement in her symptoms (failed Toviaz and Vesicare). She also has a slightly elevated postvoid residual, last visit 150 cc. She has difficulty relaxing to urinate.  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  She was referred for pelvic floor therapy (attended only 1 visit) as well as given vaginal Valium but does not think that this has helped.  She has not tried to use the medication prior to intercourse to see if it helps with her dyspareunia.  She does have a remote history of kidney stones. Calcium oxide crystals seen in her urine today. No recent flank pain.   PMH: Past Medical History  Diagnosis Date  . Seizures (HCC)   . Hypothyroidism   . GERD (gastroesophageal reflux disease)     Surgical History: Past Surgical History  Procedure Laterality Date  . Tubal ligation    . Vaginal hysterectomy  2007      due to fibroid cysts  . Removal of l ovary Left 2012    due a cyst and scar tissue  . Removal of bilateral fallopian tubes Bilateral 2012  . Cesarean section  2003    with tubal ligation   . Cholesteatoma excision  2010    to remove a benign tumor behind R ear drum   . Spine surgery  2012    2 surgeries: to remove a herniated disc and to remove a pus pocket at incision site     Home Medications:    Medication List       This list is accurate as of: 10/06/15  6:05 PM.  Always use your most recent med list.               alprazolam 2 MG tablet  Commonly known as:  XANAX  Take 2  mg by mouth 4 (four) times daily as needed for sleep.     amphetamine-dextroamphetamine 30 MG tablet  Commonly known as:  ADDERALL  Take 30 mg by mouth 2 (two) times daily.     conjugated estrogens vaginal cream  Commonly known as:  PREMARIN  Place 1 Applicatorful vaginally daily.     diclofenac 50 MG tablet  Commonly known as:  CATAFLAM  1 tab by mouth per day or twice a day if tolerated     fluticasone 220 MCG/ACT inhaler  Commonly known as:  FLOVENT HFA  Inhale 2 puffs into the lungs every 4 (four) hours as needed.     levothyroxine 150 MCG tablet  Commonly known as:  SYNTHROID, LEVOTHROID  Take 150 mcg by mouth. Every other day     levothyroxine 175 MCG tablet  Commonly known as:  SYNTHROID, LEVOTHROID  Take 175 mcg by mouth. Every other day     loratadine 10 MG tablet  Commonly known as:  CLARITIN  Take 10 mg by mouth daily.     mirabegron ER 25 MG Tb24 tablet  Commonly known as:  MYRBETRIQ  Take 1 tablet (25 mg total) by mouth daily.     omeprazole 40 MG capsule  Commonly known as:  PRILOSEC  Take 40 mg by mouth daily.     Oxycodone HCl 10 MG Tabs  1-2 tablets by mouth twice a day to 3 times a day if tolerated        Allergies:  Allergies  Allergen Reactions  . Celecoxib Other (See Comments)  . Darvon [Propoxyphene] Hypertension    Heart race  . Gabapentin Rash    Family History: Family History  Problem Relation Age of Onset  . Alcohol abuse Mother   . Arthritis Mother   . Depression Mother   . Hearing loss Mother   . Alcohol abuse Father   . Arthritis Father   . COPD Father   . Depression Father   . Hearing loss Father   . Hyperlipidemia Father   . Hypertension Father     Social History:  reports that she has been smoking Cigarettes.  She has been smoking about 1.00 pack per day. She does not have any smokeless tobacco history on file. She reports that she drinks alcohol. She reports that she does not use illicit drugs.  Physical Exam: BP  118/82 mmHg  Pulse 94  Ht  (1.575 m)  Wt 198 lb 6.4 oz (89.994 kg)  BMI 36.28 kg/m2  Constitutional:  Alert and oriented, No acute distress. HEENT: [place] AT, moist mucus membranes.  Trachea midline, no masses. Cardiovascular: No clubbing, cyanosis, or edema. Respiratory: Normal respiratory effort, no increased work of breathing. GI: Abdomen is soft, nontender, nondistended, no abdominal masses GU: No CVA tenderness.  Pelvic: Significant urethral hypermobility with Valsalva. No demonstrable stress incontinence today with semi-full bladder following cystoscopy. Speculum exam shows fairly good anterior and posterior support. Cervix is surgically absent. Normal vaginal mucosa. Tenderness on anterior vaginal wall with palpation. She does also have some tenderness in her bilateral levator muscles. Skin: No rashes, bruises or suspicious lesions. Lymph: No cervical or inguinal adenopathy. Neurologic: Grossly intact, no focal deficits, moving all 4 extremities. Psychiatric: Normal mood and affect.  Laboratory Data: Lab Results  Component Value Date   WBC 9.4 12/29/2014   HGB 14.4 12/29/2014   HCT 42.1 12/29/2014   MCV 89.6 12/29/2014   PLT 299 12/29/2014    Lab Results  Component Value Date   CREATININE 0.90 12/29/2014    Urinalysis Results for orders placed or performed in visit on 10/06/15  Microscopic Examination  Result Value Ref Range   WBC, UA 0-5 0 -  5 /hpf   RBC, UA 0-2 0 -  2 /hpf   Epithelial Cells (non renal) >10 (H) 0 - 10 /hpf   Crystals Present (A) N/A   Crystal Type Calcium Oxalate N/A   Mucus, UA Present (A) Not Estab.   Bacteria, UA Many (A) None seen/Few   Yeast, UA Present (A) None seen  Urinalysis, Complete  Result Value Ref Range   Specific Gravity, UA >1.030 (H) 1.005 - 1.030   pH, UA 5.5 5.0 - 7.5   Color, UA Yellow Yellow   Appearance Ur Cloudy (A) Clear   Leukocytes, UA Negative Negative   Protein, UA Negative Negative/Trace   Glucose, UA 1+ (A)  Negative   Ketones, UA 1+ (A) Negative   RBC, UA Trace (A) Negative   Bilirubin, UA Negative Negative   Urobilinogen, Ur 0.2 0.2 - 1.0 mg/dL   Nitrite, UA Negative Negative   Microscopic Examination See below:        Cystoscopy Procedure Note  Patient identification was confirmed, informed consent was obtained, and patient was prepped  using Betadine solution.  Lidocaine jelly was administered per urethral meatus.    Preoperative abx where received prior to procedure.    Procedure: - Flexible cystoscope introduced, without any difficulty.   - Thorough search of the bladder revealed:    normal urethral meatus    normal urothelium    no stones    no ulcers     no tumors    no urethral polyps    no trabeculation  Mild debris.    - Ureteral orifices were normal in position and appearance.  Post-Procedure: - Patient tolerated the procedure well   Pertinent Imaging: n/a  Assessment & Plan:    1. Mixed incontinence Discussed symptoms again today. Previously failed anticholinergics. We'll try Mybetriq 25 mg.  She is most bothered by her urge and urge incontinence.  Recommend strongly considering urodynamics in the future. - Urinalysis, Complete - ciprofloxacin (CIPRO) tablet 500 mg; Take 1 tablet (500 mg total) by mouth once. - lidocaine (XYLOCAINE) 2 % jelly 1 application; Place 1 application into the urethra once.  2. Pelvic floor dysfunction As above. Cystoscopy today shows no evidence of bladder pathology.  I really do feel that the patient would benefit from additional sessions of physical therapy, however, her insurance only allows for one visit annually.  3. Dyspareunia, female Recommend trial of vaginal Valium around the time of intercourse which may help.  4. History of kidney stones History of kidney stones, calcium oxide crystals and urine today. We'll check KUB to ensure there is no clinically significant stones. Asymptomatic. - DG Abd 1 View   Return in  about 6 weeks (around 11/17/2015) for symptoms recheck, PVR.  Vanna ScotlandAshley Yao Hypp[place], MD  [facility] [address], [address] [place], [place] [address] [phone]

## 2015-10-07 ENCOUNTER — Telehealth: Payer: Self-pay

## 2015-10-07 NOTE — Telephone Encounter (Signed)
-----   Message from Vanna ScotlandAshley Brandon, MD sent at 10/06/2015  5:35 PM EST ----- Please let her know that there were no stones!  Vanna ScotlandAshley Brandon, MD

## 2015-10-07 NOTE — Telephone Encounter (Signed)
LMOM

## 2015-10-10 ENCOUNTER — Other Ambulatory Visit: Payer: Self-pay | Admitting: Student

## 2015-10-10 DIAGNOSIS — M25561 Pain in right knee: Secondary | ICD-10-CM

## 2015-10-29 ENCOUNTER — Ambulatory Visit: Payer: Medicaid Other | Attending: Pain Medicine | Admitting: Pain Medicine

## 2015-10-29 ENCOUNTER — Encounter: Payer: Self-pay | Admitting: Pain Medicine

## 2015-10-29 VITALS — BP 108/68 | HR 92 | Temp 97.7°F | Resp 16 | Ht 62.0 in | Wt 189.0 lb

## 2015-10-29 DIAGNOSIS — M533 Sacrococcygeal disorders, not elsewhere classified: Secondary | ICD-10-CM

## 2015-10-29 DIAGNOSIS — M47816 Spondylosis without myelopathy or radiculopathy, lumbar region: Secondary | ICD-10-CM

## 2015-10-29 DIAGNOSIS — G43909 Migraine, unspecified, not intractable, without status migrainosus: Secondary | ICD-10-CM | POA: Diagnosis not present

## 2015-10-29 DIAGNOSIS — M47818 Spondylosis without myelopathy or radiculopathy, sacral and sacrococcygeal region: Secondary | ICD-10-CM

## 2015-10-29 DIAGNOSIS — M5030 Other cervical disc degeneration, unspecified cervical region: Secondary | ICD-10-CM

## 2015-10-29 DIAGNOSIS — R51 Headache: Secondary | ICD-10-CM | POA: Diagnosis present

## 2015-10-29 DIAGNOSIS — M5481 Occipital neuralgia: Secondary | ICD-10-CM

## 2015-10-29 DIAGNOSIS — M170 Bilateral primary osteoarthritis of knee: Secondary | ICD-10-CM

## 2015-10-29 DIAGNOSIS — M961 Postlaminectomy syndrome, not elsewhere classified: Secondary | ICD-10-CM

## 2015-10-29 DIAGNOSIS — M5136 Other intervertebral disc degeneration, lumbar region: Secondary | ICD-10-CM

## 2015-10-29 DIAGNOSIS — G43109 Migraine with aura, not intractable, without status migrainosus: Secondary | ICD-10-CM

## 2015-10-29 DIAGNOSIS — M461 Sacroiliitis, not elsewhere classified: Secondary | ICD-10-CM

## 2015-10-29 MED ORDER — OXYCODONE HCL 10 MG PO TABS
ORAL_TABLET | ORAL | Status: DC
Start: 2015-10-29 — End: 2015-11-27

## 2015-10-29 MED ORDER — LIDOCAINE HCL 4 % EX SOLN
CUTANEOUS | Status: AC
  Administered 2015-10-29: 4 mL via NASAL
  Filled 2015-10-29: qty 50

## 2015-10-29 NOTE — Progress Notes (Signed)
   Subjective:    Patient ID: [name], female    DOB: 07/02/1976, 40 y.o.   MRN: [id number]  HPI                                         SPHENOPALATINE GANGLION BLOCK  The patient is a [age] year old female who returns to pain management for further evaluation and treatment of headaches felt to be with sitting significant component of headaches due to migraine. The patient states that she has pain occurring on a weekly basis due to migraine headaches. We have discussed patient's condition and decision has been made to proceed with sphenopalatine ganglion block in attempt to decrease severity of patient's symptoms, minimize progression of symptoms, and avoid the need for more involved treatment. The patient was with understanding and in agreement with suggested treatment plan    Description Of Procedure:  Sphenopalatine Ganglion Block  The patient was placed in supine position with head hyperextended. EKG, blood pressure, pulse, and pulse oximetry monitoring were all in place. 1 cc of 4% topical lidocaine was instilled into the right nostril. The patient tolerated the administration of the topical lidocaine well. 1 cc of 4% topical lidocaine was then instilled into the left nostril. The patient tolerated the administration of the topical lidocaine well. A cotton pledget with 1 cc of 4% topical lidocaine was then placed into the right nostril followed by placement of a cotton pledget with 4% topical lidocaine 1 cc total placed into the left nostril. The patient remained in the position with the cotton pledgets in place for 30 minutes . The patient was without complaint of headache at the end of the procedure . The patient remained stable throughout the procedure  The patient tolerated the procedure well     PLAN   Continue present medication diclofenac and oxycodone  F/U PCP [name] for evaliation of  BP and general medical  condition  F/U surgical evaluation. Surgical evaluation of  knee as discussed  F/U neurological evaluation. May consider pending follow-up evaluations  F/U psych evaluation as discussed  MRI   Right knee this  week as planned  May consider radiofrequency rhizolysis or intraspinal procedures pending response to present treatment and F/U evaluation   Patient to call Pain Management Center should patient have concerns prior to scheduled return appointment              Review of Systems     Objective:   Physical Exam        Assessment & Plan:

## 2015-10-29 NOTE — Patient Instructions (Addendum)
PLAN   Continue present medication diclofenac and oxycodone  F/U PCP [name] for evaliation of  BP and general medical  condition  F/U surgical evaluation. Surgical evaluation of knee as discussed  F/U neurological evaluation. May consider pending follow-up evaluations  F/U psych evaluation as discussed  MRI   Right knee this  week as planned  May consider radiofrequency rhizolysis or intraspinal procedures pending response to present treatment and F/U evaluation   Patient to call Pain Management Center should patient have concerns prior to scheduled return appointmentPain Management Discharge Instructions  General Discharge Instructions :  If you need to reach your doctor call: Monday-Friday 8:00 am - 4:00 pm at [phone] or toll free [phone].  After clinic hours [phone] to have operator reach doctor.  Bring all of your medication bottles to all your appointments in the pain clinic.  To cancel or reschedule your appointment with Pain Management please remember to call 24 hours in advance to avoid a fee.  Refer to the educational materials which you have been given on: General Risks, I had my Procedure. Discharge Instructions, Post Sedation.  Post Procedure Instructions:  The drugs you were given will stay in your system until tomorrow, so for the next 24 hours you should not drive, make any legal decisions or drink any alcoholic beverages.  You may eat anything you prefer, but it is better to start with liquids then soups and crackers, and gradually work up to solid foods.  Please notify your doctor immediately if you have any unusual bleeding, trouble breathing or pain that is not related to your normal pain.  Depending on the type of procedure that was done, some parts of your body may feel week and/or numb.  This usually clears up by tonight or the next day.  Walk with the use of an assistive device or accompanied by an adult for the 24 hours.  You may  use ice on the affected area for the first 24 hours.  Put ice in a Ziploc bag and cover with a towel and place against area 15 minutes on 15 minutes off.  You may switch to heat after 24 hours.GENERAL RISKS AND COMPLICATIONS  What are the risk, side effects and possible complications? Generally speaking, most procedures are safe.  However, with any procedure there are risks, side effects, and the possibility of complications.  The risks and complications are dependent upon the sites that are lesioned, or the type of nerve block to be performed.  The closer the procedure is to the spine, the more serious the risks are.  Great care is taken when placing the radio frequency needles, block needles or lesioning probes, but sometimes complications can occur. 1. Infection: Any time there is an injection through the skin, there is a risk of infection.  This is why sterile conditions are used for these blocks.  There are four possible types of infection. 1. Localized skin infection. 2. Central Nervous System Infection-This can be in the form of Meningitis, which can be deadly. 3. Epidural Infections-This can be in the form of an epidural abscess, which can cause pressure inside of the spine, causing compression of the spinal cord with subsequent paralysis. This would require an emergency surgery to decompress, and there are no guarantees that the patient would recover from the paralysis. 4. Discitis-This is an infection of the intervertebral discs.  It occurs in about 1% of discography procedures.  It is difficult to treat and it may lead to surgery.  2. Pain: the needles have to go through skin and soft tissues, will cause soreness.       3. Damage to internal structures:  The nerves to be lesioned may be near blood vessels or    other nerves which can be potentially damaged.       4. Bleeding: Bleeding is more common if the patient is taking blood thinners such as  aspirin, Coumadin, Ticiid, Plavix,  etc., or if he/she have some genetic predisposition  such as hemophilia. Bleeding into the spinal canal can cause compression of the spinal  cord with subsequent paralysis.  This would require an emergency surgery to  decompress and there are no guarantees that the patient would recover from the  paralysis.       5. Pneumothorax:  Puncturing of a lung is a possibility, every time a needle is introduced in  the area of the chest or upper back.  Pneumothorax refers to free air around the  collapsed lung(s), inside of the thoracic cavity (chest cavity).  Another two possible  complications related to a similar event would include: Hemothorax and Chylothorax.   These are variations of the Pneumothorax, where instead of air around the collapsed  lung(s), you may have blood or chyle, respectively.       6. Spinal headaches: They may occur with any procedures in the area of the spine.       7. Persistent CSF (Cerebro-Spinal Fluid) leakage: This is a rare problem, but may occur  with prolonged intrathecal or epidural catheters either due to the formation of a fistulous  track or a dural tear.       8. Nerve damage: By working so close to the spinal cord, there is always a possibility of  nerve damage, which could be as serious as a permanent spinal cord injury with  paralysis.       9. Death:  Although rare, severe deadly allergic reactions known as "Anaphylactic  reaction" can occur to any of the medications used.      10. Worsening of the symptoms:  We can always make thing worse.  What are the chances of something like this happening? Chances of any of this occuring are extremely low.  By statistics, you have more of a chance of getting killed in a motor vehicle accident: while driving to the hospital than any of the above occurring .  Nevertheless, you should be aware that they are possibilities.  In general, it is similar to taking a shower.  Everybody knows that you can slip, hit your head and get killed.   Does that mean that you should not shower again?  Nevertheless always keep in mind that statistics do not mean anything if you happen to be on the wrong side of them.  Even if a procedure has a 1 (one) in a 1,000,000 (million) chance of going wrong, it you happen to be that one..Also, keep in mind that by statistics, you have more of a chance of having something go wrong when taking medications.  Who should not have this procedure? If you are on a blood thinning medication (e.g. Coumadin, Plavix, see list of "Blood Thinners"), or if you have an active infection going on, you should not have the procedure.  If you are taking any blood thinners, please inform your physician.  How should I prepare for this procedure?  Do not eat or drink anything at least six hours prior to the procedure.  Bring a driver  with you .  It cannot be a taxi.  Come accompanied by an adult that can drive you back, and that is strong enough to help you if your legs get weak or numb from the local anesthetic.  Take all of your medicines the morning of the procedure with just enough water to swallow them.  If you have diabetes, make sure that you are scheduled to have your procedure done first thing in the morning, whenever possible.  If you have diabetes, take only half of your insulin dose and notify our nurse that you have done so as soon as you arrive at the clinic.  If you are diabetic, but only take blood sugar pills (oral hypoglycemic), then do not take them on the morning of your procedure.  You may take them after you have had the procedure.  Do not take aspirin or any aspirin-containing medications, at least eleven (11) days prior to the procedure.  They may prolong bleeding.  Wear loose fitting clothing that may be easy to take off and that you would not mind if it got stained with Betadine or blood.  Do not wear any jewelry or perfume  Remove any nail coloring.  It will interfere with some of our monitoring  equipment.  NOTE: Remember that this is not meant to be interpreted as a complete list of all possible complications.  Unforeseen problems may occur.  BLOOD THINNERS The following drugs contain aspirin or other products, which can cause increased bleeding during surgery and should not be taken for 2 weeks prior to and 1 week after surgery.  If you should need take something for relief of minor pain, you may take acetaminophen which is found in Tylenol,m Datril, Anacin-3 and Panadol. It is not blood thinner. The products listed below are.  Do not take any of the products listed below in addition to any listed on your instruction sheet.  A.P.C or A.P.C with Codeine Codeine Phosphate Capsules #3 Ibuprofen Ridaura  ABC compound Congesprin Imuran rimadil  Advil Cope Indocin Robaxisal  Alka-Seltzer Effervescent Pain Reliever and Antacid Coricidin or Coricidin-D  Indomethacin Rufen  Alka-Seltzer plus Cold Medicine Cosprin Ketoprofen S-A-C Tablets  Anacin Analgesic Tablets or Capsules Coumadin Korlgesic Salflex  Anacin Extra Strength Analgesic tablets or capsules CP-2 Tablets Lanoril Salicylate  Anaprox Cuprimine Capsules Levenox Salocol  Anexsia-D Dalteparin Magan Salsalate  Anodynos Darvon compound Magnesium Salicylate Sine-off  Ansaid Dasin Capsules Magsal Sodium Salicylate  Anturane Depen Capsules Marnal Soma  APF Arthritis pain formula Dewitt's Pills Measurin Stanback  Argesic Dia-Gesic Meclofenamic Sulfinpyrazone  Arthritis Bayer Timed Release Aspirin Diclofenac Meclomen Sulindac  Arthritis pain formula Anacin Dicumarol Medipren Supac  Analgesic (Safety coated) Arthralgen Diffunasal Mefanamic Suprofen  Arthritis Strength Bufferin Dihydrocodeine Mepro Compound Suprol  Arthropan liquid Dopirydamole Methcarbomol with Aspirin Synalgos  ASA tablets/Enseals Disalcid Micrainin Tagament  Ascriptin Doan's Midol Talwin  Ascriptin A/D Dolene Mobidin Tanderil  Ascriptin Extra Strength Dolobid  Moblgesic Ticlid  Ascriptin with Codeine Doloprin or Doloprin with Codeine Momentum Tolectin  Asperbuf Duoprin Mono-gesic Trendar  Aspergum Duradyne Motrin or Motrin IB Triminicin  Aspirin plain, buffered or enteric coated Durasal Myochrisine Trigesic  Aspirin Suppositories Easprin Nalfon Trillsate  Aspirin with Codeine Ecotrin Regular or Extra Strength Naprosyn Uracel  Atromid-S Efficin Naproxen Ursinus  Auranofin Capsules Elmiron Neocylate Vanquish  Axotal Emagrin Norgesic Verin  Azathioprine Empirin or Empirin with Codeine Normiflo Vitamin E  Azolid Emprazil Nuprin Voltaren  Bayer Aspirin plain, buffered or children's or timed BC Tablets or powders  Encaprin Orgaran Warfarin Sodium  Buff-a-Comp Enoxaparin Orudis Zorpin  Buff-a-Comp with Codeine Equegesic Os-Cal-Gesic   Buffaprin Excedrin plain, buffered or Extra Strength Oxalid   Bufferin Arthritis Strength Feldene Oxphenbutazone   Bufferin plain or Extra Strength Feldene Capsules Oxycodone with Aspirin   Bufferin with Codeine Fenoprofen Fenoprofen Pabalate or Pabalate-SF   Buffets II Flogesic Panagesic   Buffinol plain or Extra Strength Florinal or Florinal with Codeine Panwarfarin   Buf-Tabs Flurbiprofen Penicillamine   Butalbital Compound Four-way cold tablets Penicillin   Butazolidin Fragmin Pepto-Bismol   Carbenicillin Geminisyn Percodan   Carna Arthritis Reliever Geopen Persantine   Carprofen Gold's salt Persistin   Chloramphenicol Goody's Phenylbutazone   Chloromycetin Haltrain Piroxlcam   Clmetidine heparin Plaquenil   Cllnoril Hyco-pap Ponstel   Clofibrate Hydroxy chloroquine Propoxyphen         Before stopping any of these medications, be sure to consult the physician who ordered them.  Some, such as Coumadin (Warfarin) are ordered to prevent or treat serious conditions such as "deep thrombosis", "pumonary embolisms", and other heart problems.  The amount of time that you may need off of the medication may also vary with  the medication and the reason for which you were taking it.  If you are taking any of these medications, please make sure you notify your pain physician before you undergo any procedures.

## 2015-10-29 NOTE — Progress Notes (Signed)
Safety precautions to be maintained throughout the outpatient stay will include: orient to surroundings, keep bed in low position, maintain call bell within reach at all times, provide assistance with transfer out of bed and ambulation.  

## 2015-10-30 ENCOUNTER — Telehealth: Payer: Self-pay | Admitting: *Deleted

## 2015-10-30 NOTE — Telephone Encounter (Signed)
Left voicemail with patient to call our office with any questions or concerns from procedure.

## 2015-10-31 ENCOUNTER — Ambulatory Visit
Admission: RE | Admit: 2015-10-31 | Discharge: 2015-10-31 | Disposition: A | Discharge status: Home / Self Care | Payer: Medicaid Other | Source: Ambulatory Visit | Attending: Student | Admitting: Student

## 2015-10-31 DIAGNOSIS — M2410 Other articular cartilage disorders, unspecified site: Secondary | ICD-10-CM | POA: Diagnosis not present

## 2015-10-31 DIAGNOSIS — M25561 Pain in right knee: Secondary | ICD-10-CM

## 2015-11-18 ENCOUNTER — Telehealth: Payer: Self-pay | Admitting: Urology

## 2015-11-18 ENCOUNTER — Other Ambulatory Visit: Payer: Self-pay

## 2015-11-18 ENCOUNTER — Ambulatory Visit (INDEPENDENT_AMBULATORY_CARE_PROVIDER_SITE_OTHER): Payer: Medicaid Other | Admitting: Urology

## 2015-11-18 ENCOUNTER — Encounter: Payer: Self-pay | Admitting: Urology

## 2015-11-18 VITALS — BP 138/91 | HR 106 | Wt 199.8 lb

## 2015-11-18 DIAGNOSIS — R3129 Other microscopic hematuria: Secondary | ICD-10-CM

## 2015-11-18 DIAGNOSIS — R350 Frequency of micturition: Secondary | ICD-10-CM | POA: Diagnosis not present

## 2015-11-18 DIAGNOSIS — N3941 Urge incontinence: Secondary | ICD-10-CM | POA: Diagnosis not present

## 2015-11-18 DIAGNOSIS — N941 Unspecified dyspareunia: Secondary | ICD-10-CM

## 2015-11-18 DIAGNOSIS — N3946 Mixed incontinence: Secondary | ICD-10-CM | POA: Diagnosis not present

## 2015-11-18 LAB — MICROSCOPIC EXAMINATION

## 2015-11-18 LAB — URINALYSIS, COMPLETE
Bilirubin, UA: NEGATIVE
Glucose, UA: NEGATIVE
Ketones, UA: NEGATIVE
LEUKOCYTES UA: NEGATIVE
Nitrite, UA: NEGATIVE
PH UA: 5.5 (ref 5.0–7.5)
Protein, UA: NEGATIVE
Specific Gravity, UA: 1.02 (ref 1.005–1.030)
Urobilinogen, Ur: 0.2 mg/dL (ref 0.2–1.0)

## 2015-11-18 LAB — BLADDER SCAN AMB NON-IMAGING

## 2015-11-18 MED ORDER — MIRABEGRON ER 50 MG PO TB24
50.0000 mg | ORAL_TABLET | Freq: Every day | ORAL | Status: DC
Start: 2015-11-18 — End: 2016-11-18

## 2015-11-18 NOTE — Progress Notes (Signed)
11/18/2015 11:11 PM   [name] 1975-11-10 [id number]  Referring provider: Phineas Foley [facility] [address] [place], [place] [address]  Chief Complaint  Patient presents with  . Urinary Incontinence    6wk with bladder scan    HPI: 40 yo F with mixed urinary incontinence, urinary frequency, pelvic pain, and dysparunia who returns today.    She has seen some improvement in her urinary urgency and urge incontinence with Mybetriq 25 mg daily. She is quite pleased with the result. She is never previously done this well with anticholinergics.  [name] does feel that she is emptying her bladder today. She does occasionally leak a little bit when she laughs, coughs, or sneezes, however this is significantly improved as well.  She has not been sexually active since her last visit therefore has not tried vaginal Valium prior to sexual intercourse.   She's had a lot of social stressors in her life over the past few months.  No flank pain, dysuria, or gross hematuria.  Cystoscopy on 10/06/2015 negative. She did have anterior vaginal wall tenderness and bilateral levator tenderness on pelvic exam in the past.  She did have pelvic floor therapy 1 but was unable to return due to cost.  PMH: Past Medical History  Diagnosis Date  . Seizures (HCC)   . Hypothyroidism   . GERD (gastroesophageal reflux disease)     Surgical History: Past Surgical History  Procedure Laterality Date  . Tubal ligation    . Vaginal hysterectomy  2007      due to fibroid cysts  . Removal of l ovary Left 2012    due a cyst and scar tissue  . Removal of bilateral fallopian tubes Bilateral 2012  . Cesarean section  2003    with tubal ligation   . Cholesteatoma excision  2010    to remove a benign tumor behind R ear drum   . Spine surgery  2012    2 surgeries: to remove a herniated disc and to remove a pus pocket at incision site     Home Medications:      Medication List       This list is accurate as of: 11/18/15 11:11 PM.  Always use your most recent med list.               alprazolam 2 MG tablet  Commonly known as:  XANAX  Take 2 mg by mouth 4 (four) times daily as needed for sleep.     amphetamine-dextroamphetamine 30 MG tablet  Commonly known as:  ADDERALL  Take 30 mg by mouth 2 (two) times daily.     cetirizine 10 MG tablet  Commonly known as:  ZYRTEC  Take by mouth.     conjugated estrogens vaginal cream  Commonly known as:  PREMARIN  Place 1 Applicatorful vaginally daily.     diclofenac 50 MG tablet  Commonly known as:  CATAFLAM  1 tab by mouth per day or twice a day if tolerated     fluticasone 220 MCG/ACT inhaler  Commonly known as:  FLOVENT HFA  Inhale 2 puffs into the lungs every 4 (four) hours as needed.     levothyroxine 150 MCG tablet  Commonly known as:  SYNTHROID, LEVOTHROID  Take 150 mcg by mouth. Every other day     levothyroxine 175 MCG tablet  Commonly known as:  SYNTHROID, LEVOTHROID  Take 175 mcg by mouth. Every other day  loratadine 10 MG tablet  Commonly known as:  CLARITIN  Take 10 mg by mouth daily.     mirabegron ER 50 MG Tb24 tablet  Commonly known as:  MYRBETRIQ  Take 1 tablet (50 mg total) by mouth daily.     omeprazole 40 MG capsule  Commonly known as:  PRILOSEC  Take 40 mg by mouth daily.     Oxycodone HCl 10 MG Tabs  1-2 tablets by mouth twice a day to 3 times a day if tolerated     oxyCODONE-acetaminophen 10-325 MG tablet  Commonly known as:  PERCOCET  Take by mouth.     traMADol 50 MG tablet  Commonly known as:  ULTRAM  Take by mouth.        Allergies:  Allergies  Allergen Reactions  . Celecoxib Other (See Comments)  . Darvon [Propoxyphene] Hypertension    Other reaction(s): Unknown Heart race  . Gabapentin Rash    Family History: Family History  Problem Relation Age of Onset  . Alcohol abuse Mother   . Arthritis Mother   . Depression Mother   .  Hearing loss Mother   . Alcohol abuse Father   . Arthritis Father   . COPD Father   . Depression Father   . Hearing loss Father   . Hyperlipidemia Father   . Hypertension Father     Social History:  reports that she has been smoking Cigarettes.  She has been smoking about 1.00 pack per day. She does not have any smokeless tobacco history on file. She reports that she drinks alcohol. She reports that she does not use illicit drugs.  ROS: UROLOGY Frequent Urination?: Yes Hard to postpone urination?: Yes Burning/pain with urination?: No Get up at night to urinate?: Yes Leakage of urine?: Yes Urine stream starts and stops?: Yes Trouble starting stream?: Yes Do you have to strain to urinate?: Yes Blood in urine?: No Urinary tract infection?: No Sexually transmitted disease?: No Injury to kidneys or bladder?: No Painful intercourse?: Yes Weak stream?: Yes Currently pregnant?: No Vaginal bleeding?: No Last menstrual period?: n  Gastrointestinal Nausea?: Yes Vomiting?: Yes Indigestion/heartburn?: Yes Diarrhea?: No Constipation?: No  Constitutional Fever: No Night sweats?: No Weight loss?: No Fatigue?: Yes  Skin Skin rash/lesions?: No Itching?: No  Eyes Blurred vision?: No Double vision?: No  Ears/Nose/Throat Sore throat?: No Sinus problems?: No  Hematologic/Lymphatic Swollen glands?: No Easy bruising?: Yes  Cardiovascular Leg swelling?: Yes Chest pain?: No  Respiratory Cough?: Yes Shortness of breath?: Yes  Endocrine Excessive thirst?: Yes  Musculoskeletal Back pain?: Yes Joint pain?: Yes  Neurological Headaches?: Yes Dizziness?: Yes  Psychologic Depression?: Yes Anxiety?: Yes  Physical Exam: BP 138/91 mmHg  Pulse 106  Wt 199 lb 12.8 oz (90.629 kg)  Constitutional:  Alert and oriented, No acute distress. HEENT: [place] AT, moist mucus membranes.  Trachea midline, no masses. Cardiovascular: No clubbing, cyanosis, or edema. Respiratory:  Normal respiratory effort, no increased work of breathing. Skin: No rashes, bruises or suspicious lesions. Neurologic: Grossly intact, no focal deficits, moving all 4 extremities. Psychiatric: Normal mood and affect.  Laboratory Data: Lab Results  Component Value Date   WBC 9.4 12/29/2014   HGB 14.4 12/29/2014   HCT 42.1 12/29/2014   MCV 89.6 12/29/2014   PLT 299 12/29/2014    Lab Results  Component Value Date   CREATININE 0.90 12/29/2014    Urinalysis Results for orders placed or performed in visit on 11/18/15  Microscopic Examination  Result Value Ref Range  WBC, UA 0-5 0 -  5 /hpf   RBC, UA 3-10 (A) 0 -  2 /hpf   Epithelial Cells (non renal) 0-10 0 - 10 /hpf   Mucus, UA Present (A) Not Estab.   Bacteria, UA Moderate (A) None seen/Few  Urinalysis, Complete  Result Value Ref Range   Specific Gravity, UA 1.020 1.005 - 1.030   pH, UA 5.5 5.0 - 7.5   Color, UA Yellow Yellow   Appearance Ur Clear Clear   Leukocytes, UA Negative Negative   Protein, UA Negative Negative/Trace   Glucose, UA Negative Negative   Ketones, UA Negative Negative   RBC, UA 1+ (A) Negative   Bilirubin, UA Negative Negative   Urobilinogen, Ur 0.2 0.2 - 1.0 mg/dL   Nitrite, UA Negative Negative   Microscopic Examination See below:   BLADDER SCAN AMB NON-IMAGING  Result Value Ref Range   Scan Result 0ml     Pertinent Imaging: Results for orders placed or performed in visit on 11/18/15  Microscopic Examination  Result Value Ref Range   WBC, UA 0-5 0 -  5 /hpf   RBC, UA 3-10 (A) 0 -  2 /hpf   Epithelial Cells (non renal) 0-10 0 - 10 /hpf   Mucus, UA Present (A) Not Estab.   Bacteria, UA Moderate (A) None seen/Few  Urinalysis, Complete  Result Value Ref Range   Specific Gravity, UA 1.020 1.005 - 1.030   pH, UA 5.5 5.0 - 7.5   Color, UA Yellow Yellow   Appearance Ur Clear Clear   Leukocytes, UA Negative Negative   Protein, UA Negative Negative/Trace   Glucose, UA Negative Negative    Ketones, UA Negative Negative   RBC, UA 1+ (A) Negative   Bilirubin, UA Negative Negative   Urobilinogen, Ur 0.2 0.2 - 1.0 mg/dL   Nitrite, UA Negative Negative   Microscopic Examination See below:   BLADDER SCAN AMB NON-IMAGING  Result Value Ref Range   Scan Result 0ml      Assessment & Plan:    1. Mixed incontinence Improvement with Mybetriq 25 mg. She continues to have some mild urgency and frequency therefore plan to increase dose to 50 mg.  No concern for incomplete bladder emptying today with PVR 0.  She is agreeable to this plan. Continue pelvic floor exercises. - Urinalysis, Complete - BLADDER SCAN AMB NON-IMAGING  2. Urinary frequency As above  3. Dyspareunia, female Trial of vaginal Valium prior to intercourse  4. Microscopic hematuria Status post negative cystoscopy last visit 12/16. She now has microscopic hematuria on several independent urinalyses. I have recommended obtaining a CT urogram for completion of her hematuria workup. -will call with CT Urogram results  Return in about 1 year (around 11/17/2016) for pvr, recheck symptoms.  Vanna Scotland, MD  [facility] [address], [address] [place], [place] [address] [phone]

## 2015-11-18 NOTE — Telephone Encounter (Signed)
Would you please call this patient and let her know that she had microscopic blood again on urinalysis.    Per the AUA guidelines, when there is microscopic blood on multiple UAs, we do recommend getting a CT urogram to evaluate the kidneys and ureters. Her bladders are deep and evaluated cystoscopy. I would like her to have decided to complete her workup.  She'll need to get blood work beforehand to check her renal function.  Vanna Scotland, MD

## 2015-11-20 ENCOUNTER — Other Ambulatory Visit: Payer: Medicaid Other

## 2015-11-20 DIAGNOSIS — R3129 Other microscopic hematuria: Secondary | ICD-10-CM

## 2015-11-20 NOTE — Telephone Encounter (Signed)
Spoke with pt in reference to micro hematuria. Pt stated she is scheduled for a CT on 12/03/15. Pt will stop by office today for labs.

## 2015-11-20 NOTE — Telephone Encounter (Signed)
LMOM

## 2015-11-21 LAB — BUN+CREAT
BUN/Creatinine Ratio: 13 (ref 8–20)
BUN: 8 mg/dL (ref 6–20)
Creatinine, Ser: 0.64 mg/dL (ref 0.57–1.00)
GFR calc Af Amer: 131 mL/min/{1.73_m2} (ref >59)
GFR, EST NON AFRICAN AMERICAN: 114 mL/min/{1.73_m2} (ref >59)

## 2015-11-24 DIAGNOSIS — M222X1 Patellofemoral disorders, right knee: Secondary | ICD-10-CM | POA: Insufficient documentation

## 2015-11-25 ENCOUNTER — Telehealth: Payer: Self-pay | Admitting: Urology

## 2015-11-25 NOTE — Telephone Encounter (Signed)
Pt called to find out if her lab results were back from last week.  Please give her a call.

## 2015-11-26 NOTE — Telephone Encounter (Signed)
Spoke with pt in reference to labs. Made pt aware the labs were done last week to make sure kidney function was well enough to have CT performed. Pt voiced understanding.

## 2015-11-27 ENCOUNTER — Encounter: Payer: Self-pay | Admitting: Pain Medicine

## 2015-11-27 ENCOUNTER — Ambulatory Visit: Payer: Medicaid Other | Attending: Pain Medicine | Admitting: Pain Medicine

## 2015-11-27 VITALS — BP 125/53 | HR 100 | Temp 98.2°F | Resp 16 | Ht 62.0 in | Wt 196.0 lb

## 2015-11-27 DIAGNOSIS — G43909 Migraine, unspecified, not intractable, without status migrainosus: Secondary | ICD-10-CM | POA: Insufficient documentation

## 2015-11-27 DIAGNOSIS — M47816 Spondylosis without myelopathy or radiculopathy, lumbar region: Secondary | ICD-10-CM

## 2015-11-27 DIAGNOSIS — M47818 Spondylosis without myelopathy or radiculopathy, sacral and sacrococcygeal region: Secondary | ICD-10-CM

## 2015-11-27 DIAGNOSIS — M170 Bilateral primary osteoarthritis of knee: Secondary | ICD-10-CM | POA: Diagnosis not present

## 2015-11-27 DIAGNOSIS — M5136 Other intervertebral disc degeneration, lumbar region: Secondary | ICD-10-CM

## 2015-11-27 DIAGNOSIS — M4806 Spinal stenosis, lumbar region: Secondary | ICD-10-CM | POA: Insufficient documentation

## 2015-11-27 DIAGNOSIS — Z9889 Other specified postprocedural states: Secondary | ICD-10-CM | POA: Insufficient documentation

## 2015-11-27 DIAGNOSIS — M5030 Other cervical disc degeneration, unspecified cervical region: Secondary | ICD-10-CM | POA: Insufficient documentation

## 2015-11-27 DIAGNOSIS — M5126 Other intervertebral disc displacement, lumbar region: Secondary | ICD-10-CM | POA: Diagnosis not present

## 2015-11-27 DIAGNOSIS — M5481 Occipital neuralgia: Secondary | ICD-10-CM | POA: Insufficient documentation

## 2015-11-27 DIAGNOSIS — G43109 Migraine with aura, not intractable, without status migrainosus: Secondary | ICD-10-CM

## 2015-11-27 DIAGNOSIS — M542 Cervicalgia: Secondary | ICD-10-CM | POA: Diagnosis present

## 2015-11-27 DIAGNOSIS — R51 Headache: Secondary | ICD-10-CM | POA: Diagnosis not present

## 2015-11-27 DIAGNOSIS — M546 Pain in thoracic spine: Secondary | ICD-10-CM | POA: Diagnosis present

## 2015-11-27 DIAGNOSIS — M961 Postlaminectomy syndrome, not elsewhere classified: Secondary | ICD-10-CM

## 2015-11-27 DIAGNOSIS — M461 Sacroiliitis, not elsewhere classified: Secondary | ICD-10-CM

## 2015-11-27 DIAGNOSIS — M533 Sacrococcygeal disorders, not elsewhere classified: Secondary | ICD-10-CM

## 2015-11-27 MED ORDER — DICLOFENAC POTASSIUM 50 MG PO TABS: ORAL_TABLET | ORAL | Status: DC

## 2015-11-27 MED ORDER — OXYCODONE HCL 10 MG PO TABS: ORAL_TABLET | ORAL | Status: DC

## 2015-11-27 NOTE — Progress Notes (Signed)
Subjective:    Patient ID: [name], female    DOB: 12/04/1975, 40 y.o.   MRN: [id number]  HPI  The patient is a 40 year old female who returns to pain management for further evaluation and treatment of pain involving the neck entire back upper and lower extremity region. The patient noted tremendous improvement of her headache pattern as well as frequency and intensity of the headaches decreased tremendously following sphenopalatine ganglion block We discussed patient's condition and will proceed with sphenopalatine ganglion block at time return appointment.. The patient was with understanding and agreed to proceed with an interventional treatment consisting of single ganglion block at time return appointment in attempt to decrease severity of symptoms, minimize progression of symptoms, and avoid the need for more involved treatment. The patient also is undergone surgical evaluation of her knee and we'll follow up with surgeon as discussed. Patient continues to have pain of the lumbar lower extremity region At the present time we will avoid interventional treatment of the lumbar lower extremity region for treatment of headaches the patient was in agreement suggested treatment plan.      Review of Systems     Objective:   Physical Exam There was tenderness to palpation of the paraspinal musculature region cervical region cervical facet region of mild to moderate degree there was moderate tenderness to palpation of the splenius capitis and occipitalis musculature region on the left as well as on the right. No masses of the head and neck were noted. No bounding pulsations of the temporal region were noted. There was tends to palpation over the region of the trapezius levator scapula and rhomboid musculature region of mild to moderate degree. The patient appeared to be with bilaterally equal grip strength. Tinel and Phalen's maneuver were without increase of pain of significant degree. There  was tenderness to palpation of the acromioclavicular and glenohumeral joint region a mild degree. Outpatient over the thoracic facet thoracic paraspinal must reason was without crepitus of the thoracic region noted. Palpation over the lumbar paraspinal must reason lumbar facet region was attends to palpation of mild to moderate degree with lateral bending rotation extension and palpation of the lumbar facets reproducing mild to moderate discomfort. Straight leg  raising was tolerates approximately 30 without increased pain with dorsiflexion noted. There was slight fullness of the popliteal fossa noted.. There appeared to be negative anterior and posterior drawer signs without ballottement of the patella. Crepitus of the knees were noted. There was negative clonus negative Homans. No sensory deficit or dermatomal distribution of the lower extremities noted. There was mild to moderate tenderness of the greater trochanteric region iliotibial band region with moderate tends to palpation of the PSIS and PII S region. The abdomen was nontender with no costovertebral tenderness noted.       Assessment & Plan:      Migraine headache  Bilateral occipital neuralgia  Deep degenerative disc disease cervical spine  Degenerative disc disease lumbar spine Status post prior hemilaminectomy L2, degenerative changes lumbar spine with stenosis and moderate bilateral foraminal stenosis, L3-4 degenerative disc disease and facet changes with diffuse disc bulging and disc material abutting the L3 nerve root in the foraminal zone, L4-5 degenerative disc disease, diffuse facet changes noted throughout the lumbar spine  Lumbar facet syndrome  Degenerative joint disease of knees     PLAN  Continue present medication diclofenac and oxycodone  Sphenopalatine ganglion block to be performed at time return appointment  F/U PCP Dr. Allena Katz for evaluation of  blood pressure and general medical condition as discussed    F/U surgical evaluation. Surgical evaluation of knee as discussed  F/U neurological evaluation. May consider pending follow-up evaluations  F/U psych evaluation as discussed  May consider radiofrequency rhizolysis or intraspinal procedures pending response to present treatment and F/U evaluation   Patient to call [facility] should patient have concerns prior to scheduled return appointment
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
## 2015-11-27 NOTE — Progress Notes (Signed)
Safety precautions to be maintained throughout the outpatient stay will include: orient to surroundings, keep bed in low position, maintain call bell within reach at all times, provide assistance with transfer out of bed and ambulation.  

## 2015-12-03 ENCOUNTER — Ambulatory Visit
Admission: RE | Admit: 2015-12-03 | Discharge: 2015-12-03 | Disposition: A | Discharge status: Home / Self Care | Payer: Medicaid Other | Source: Ambulatory Visit | Attending: Urology | Admitting: Urology

## 2015-12-03 DIAGNOSIS — R935 Abnormal findings on diagnostic imaging of other abdominal regions, including retroperitoneum: Secondary | ICD-10-CM | POA: Diagnosis not present

## 2015-12-03 DIAGNOSIS — R3129 Other microscopic hematuria: Secondary | ICD-10-CM | POA: Diagnosis not present

## 2015-12-03 HISTORY — DX: Unspecified asthma, uncomplicated: J45.909

## 2015-12-03 MED ORDER — IOHEXOL 300 MG/ML  SOLN
150.0000 mL | Freq: Once | INTRAMUSCULAR | Status: AC | PRN
  Administered 2015-12-03: 150 mL via INTRAVENOUS

## 2015-12-04 ENCOUNTER — Telehealth: Payer: Self-pay | Admitting: Urology

## 2015-12-04 NOTE — Telephone Encounter (Signed)
Patient had her CT scan done yesterday but she said no one told her if she needed to come in for results or if you were going to call her with them? I checked her chart but i didn't see anything about that. What you like for her to do? If i need to make her an appointment to go over the results just let me know and i will see when i can get her in.  Thanks,  Marcelino Duster

## 2015-12-05 ENCOUNTER — Telehealth: Payer: Self-pay

## 2015-12-05 NOTE — Telephone Encounter (Signed)
Plan to call with results.   Vanna Scotland, MD

## 2015-12-05 NOTE — Telephone Encounter (Signed)
-----   Message from Vanna Scotland, MD sent at 12/05/2015  8:38 AM EST ----- Please let this patient know that her CT scan looked fine.  We were unable to fully evaluate the bottom part of the right ureter, but everything else was perfect.  There is very minimal risk that we are missing something in that part of the ureter given her age, etc.    Plan for follow up as scheduled.    Vanna Scotland, MD
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
## 2015-12-08 NOTE — Telephone Encounter (Signed)
Spoke with pt in reference to CT results. Pt voiced understanding stating she would f/u as scheduled.

## 2015-12-08 NOTE — Telephone Encounter (Signed)
-----   Message from Ashley Brandon, MD sent at 12/05/2015  8:38 AM EST ----- Please let this patient know that her CT scan looked fine.  We were unable to fully evaluate the bottom part of the right ureter, but everything else was perfect.  There is very minimal risk that we are missing something in that part of the ureter given her age, etc.    Plan for follow up as scheduled.    Ashley Brandon, MD  

## 2015-12-10 ENCOUNTER — Ambulatory Visit: Payer: Medicaid Other | Attending: Pain Medicine | Admitting: Pain Medicine

## 2015-12-10 ENCOUNTER — Encounter: Payer: Self-pay | Admitting: Pain Medicine

## 2015-12-10 VITALS — BP 118/78 | HR 80 | Temp 98.1°F | Resp 18 | Ht 62.0 in | Wt 192.0 lb

## 2015-12-10 DIAGNOSIS — M47816 Spondylosis without myelopathy or radiculopathy, lumbar region: Secondary | ICD-10-CM

## 2015-12-10 DIAGNOSIS — M5481 Occipital neuralgia: Secondary | ICD-10-CM

## 2015-12-10 DIAGNOSIS — M170 Bilateral primary osteoarthritis of knee: Secondary | ICD-10-CM

## 2015-12-10 DIAGNOSIS — G43909 Migraine, unspecified, not intractable, without status migrainosus: Secondary | ICD-10-CM | POA: Diagnosis not present

## 2015-12-10 DIAGNOSIS — M5030 Other cervical disc degeneration, unspecified cervical region: Secondary | ICD-10-CM

## 2015-12-10 DIAGNOSIS — M461 Sacroiliitis, not elsewhere classified: Secondary | ICD-10-CM

## 2015-12-10 DIAGNOSIS — M533 Sacrococcygeal disorders, not elsewhere classified: Secondary | ICD-10-CM

## 2015-12-10 DIAGNOSIS — M5136 Other intervertebral disc degeneration, lumbar region: Secondary | ICD-10-CM

## 2015-12-10 DIAGNOSIS — G43109 Migraine with aura, not intractable, without status migrainosus: Secondary | ICD-10-CM

## 2015-12-10 DIAGNOSIS — R51 Headache: Secondary | ICD-10-CM | POA: Diagnosis present

## 2015-12-10 DIAGNOSIS — M47818 Spondylosis without myelopathy or radiculopathy, sacral and sacrococcygeal region: Secondary | ICD-10-CM

## 2015-12-10 DIAGNOSIS — M961 Postlaminectomy syndrome, not elsewhere classified: Secondary | ICD-10-CM

## 2015-12-10 MED ORDER — LIDOCAINE HCL 4 % EX SOLN
CUTANEOUS | Status: AC
  Filled 2015-12-10: qty 50

## 2015-12-10 NOTE — Progress Notes (Signed)
Safety precautions to be maintained throughout the outpatient stay will include: orient to surroundings, keep bed in low position, maintain call bell within reach at all times, provide assistance with transfer out of bed and ambulation.  

## 2015-12-10 NOTE — Progress Notes (Signed)
   Subjective:    Patient ID: [name], female    DOB: 1976/06/17, 40 y.o.   MRN: [id number]  HPI                                        SPHENOPALATINE GANGLION BLOCK   The patient is a 40 year old female who returns to pain management for further evaluation and treatment of headaches. The patient is with known migraine headaches and there is concern regarding component of headaches being due to greater occipital neuralgia. At the present time we will proceed with sphenopalatine ganglion block in attempt to get decrease the severity of patient's symptoms as well as decreased the frequency of patient's headaches. The risks benefits and expectations of procedure have been discussed and explained to patient who is with understanding and in agreement with suggested treatment plan.   Description Of Procedure:   Sphenopalatine Ganglion Block  The patient was placed in supine position with the head hyperextended. EKG, blood pressure, pulse, and pulse oximetry monitors were all in place. With patient in supine position and head extended 1 cc of 4% lidocaine plain was instilled in the right nostril followed by 1 cc of 4% lidocaine plain instilled in the left nostril. . The patient tolerated the medication well. A cotton pledget with 1 cc of 4% lidocaine was then placed in the right nostril followed by a cotton pledget with 1 cc of 4% lidocaine placed in the left nostril. The patient remained in the supine position with the head hyperextended for 35 minutes. The patient tolerated procedure well without complaint of headache at the end of the procedure.  A total of 4 cc of 4% lidocaine was utilized for the procedure  The patient tolerated the procedure well    PLAN   Continue present medication diclofenac and oxycodone  F/U PCP Dr. Allena Katz for evaluation of blood pressure and general medical condition as discussed   F/U surgical evaluation. Surgical evaluation of knee as discussed  F/U  neurological evaluation. May consider pending follow-up evaluations  F/U psych evaluation as discussed  May consider radiofrequency rhizolysis or intraspinal procedures pending response to present treatment and F/U evaluation   Patient to call [facility] Management Center should patient have concerns prior to scheduled return appointment       Review of Systems     Objective:   Physical Exam        Assessment & Plan:

## 2015-12-10 NOTE — Patient Instructions (Signed)
PLAN   Continue present medication diclofenac and oxycodone  F/U PCP Dr. Allena Katz for evaluation of blood pressure and general medical condition as discussed   F/U surgical evaluation. Surgical evaluation of knee as discussed  F/U neurological evaluation. May consider pending follow-up evaluations  F/U psych evaluation as discussed  May consider radiofrequency rhizolysis or intraspinal procedures pending response to present treatment and F/U evaluation   Patient to call Pain Management Center should patient have concerns prior to scheduled return appointment

## 2015-12-11 ENCOUNTER — Telehealth: Payer: Self-pay | Admitting: *Deleted

## 2015-12-11 NOTE — Telephone Encounter (Signed)
Verbalizes no complicatons from procedure on yesterday.

## 2015-12-12 ENCOUNTER — Other Ambulatory Visit: Payer: Self-pay | Admitting: Pain Medicine

## 2015-12-25 ENCOUNTER — Ambulatory Visit: Payer: Medicaid Other | Attending: Pain Medicine | Admitting: Pain Medicine

## 2015-12-25 ENCOUNTER — Encounter: Payer: Self-pay | Admitting: Pain Medicine

## 2015-12-25 VITALS — BP 127/72 | HR 114 | Temp 97.9°F | Resp 18 | Ht 62.0 in | Wt 189.0 lb

## 2015-12-25 DIAGNOSIS — M5481 Occipital neuralgia: Secondary | ICD-10-CM | POA: Diagnosis not present

## 2015-12-25 DIAGNOSIS — M5136 Other intervertebral disc degeneration, lumbar region: Secondary | ICD-10-CM | POA: Insufficient documentation

## 2015-12-25 DIAGNOSIS — Z9889 Other specified postprocedural states: Secondary | ICD-10-CM | POA: Diagnosis not present

## 2015-12-25 DIAGNOSIS — M4806 Spinal stenosis, lumbar region: Secondary | ICD-10-CM | POA: Insufficient documentation

## 2015-12-25 DIAGNOSIS — M5126 Other intervertebral disc displacement, lumbar region: Secondary | ICD-10-CM | POA: Diagnosis not present

## 2015-12-25 DIAGNOSIS — G43109 Migraine with aura, not intractable, without status migrainosus: Secondary | ICD-10-CM

## 2015-12-25 DIAGNOSIS — M961 Postlaminectomy syndrome, not elsewhere classified: Secondary | ICD-10-CM

## 2015-12-25 DIAGNOSIS — G43909 Migraine, unspecified, not intractable, without status migrainosus: Secondary | ICD-10-CM | POA: Insufficient documentation

## 2015-12-25 DIAGNOSIS — M51369 Other intervertebral disc degeneration, lumbar region without mention of lumbar back pain or lower extremity pain: Secondary | ICD-10-CM

## 2015-12-25 DIAGNOSIS — R51 Headache: Secondary | ICD-10-CM | POA: Diagnosis present

## 2015-12-25 DIAGNOSIS — M5030 Other cervical disc degeneration, unspecified cervical region: Secondary | ICD-10-CM

## 2015-12-25 DIAGNOSIS — M546 Pain in thoracic spine: Secondary | ICD-10-CM | POA: Diagnosis present

## 2015-12-25 DIAGNOSIS — M533 Sacrococcygeal disorders, not elsewhere classified: Secondary | ICD-10-CM

## 2015-12-25 DIAGNOSIS — M461 Sacroiliitis, not elsewhere classified: Secondary | ICD-10-CM

## 2015-12-25 DIAGNOSIS — M47818 Spondylosis without myelopathy or radiculopathy, sacral and sacrococcygeal region: Secondary | ICD-10-CM

## 2015-12-25 DIAGNOSIS — M542 Cervicalgia: Secondary | ICD-10-CM | POA: Diagnosis present

## 2015-12-25 DIAGNOSIS — M170 Bilateral primary osteoarthritis of knee: Secondary | ICD-10-CM

## 2015-12-25 DIAGNOSIS — M47816 Spondylosis without myelopathy or radiculopathy, lumbar region: Secondary | ICD-10-CM

## 2015-12-25 MED ORDER — OXYCODONE HCL 10 MG PO TABS: ORAL_TABLET | ORAL | Status: DC
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
## 2016-01-05 ENCOUNTER — Encounter: Payer: Self-pay | Admitting: Pain Medicine

## 2016-01-05 ENCOUNTER — Ambulatory Visit: Payer: Medicaid Other | Attending: Pain Medicine | Admitting: Pain Medicine

## 2016-01-05 VITALS — BP 105/70 | HR 81 | Temp 98.1°F | Resp 17 | Ht 62.0 in | Wt 192.0 lb

## 2016-01-05 DIAGNOSIS — R51 Headache: Secondary | ICD-10-CM | POA: Diagnosis present

## 2016-01-05 DIAGNOSIS — M47818 Spondylosis without myelopathy or radiculopathy, sacral and sacrococcygeal region: Secondary | ICD-10-CM

## 2016-01-05 DIAGNOSIS — M961 Postlaminectomy syndrome, not elsewhere classified: Secondary | ICD-10-CM

## 2016-01-05 DIAGNOSIS — M5030 Other cervical disc degeneration, unspecified cervical region: Secondary | ICD-10-CM

## 2016-01-05 DIAGNOSIS — M461 Sacroiliitis, not elsewhere classified: Secondary | ICD-10-CM

## 2016-01-05 DIAGNOSIS — M51369 Other intervertebral disc degeneration, lumbar region without mention of lumbar back pain or lower extremity pain: Secondary | ICD-10-CM

## 2016-01-05 DIAGNOSIS — M5481 Occipital neuralgia: Secondary | ICD-10-CM

## 2016-01-05 DIAGNOSIS — M170 Bilateral primary osteoarthritis of knee: Secondary | ICD-10-CM

## 2016-01-05 DIAGNOSIS — M542 Cervicalgia: Secondary | ICD-10-CM | POA: Insufficient documentation

## 2016-01-05 DIAGNOSIS — M47816 Spondylosis without myelopathy or radiculopathy, lumbar region: Secondary | ICD-10-CM

## 2016-01-05 DIAGNOSIS — M5136 Other intervertebral disc degeneration, lumbar region: Secondary | ICD-10-CM

## 2016-01-05 DIAGNOSIS — G43109 Migraine with aura, not intractable, without status migrainosus: Secondary | ICD-10-CM

## 2016-01-05 DIAGNOSIS — M533 Sacrococcygeal disorders, not elsewhere classified: Secondary | ICD-10-CM

## 2016-01-05 MED ORDER — TRIAMCINOLONE ACETONIDE 40 MG/ML IJ SUSP
INTRAMUSCULAR | Status: AC
  Administered 2016-01-05: 13:00:00
  Filled 2016-01-05: qty 1

## 2016-01-05 MED ORDER — ORPHENADRINE CITRATE 30 MG/ML IJ SOLN
INTRAMUSCULAR | Status: AC
  Administered 2016-01-05: 13:00:00
  Filled 2016-01-05: qty 2

## 2016-01-05 MED ORDER — FENTANYL CITRATE (PF) 100 MCG/2ML IJ SOLN: 100.0000 ug | Freq: Once | INTRAMUSCULAR | Status: DC

## 2016-01-05 MED ORDER — TRIAMCINOLONE ACETONIDE 40 MG/ML IJ SUSP: 40.0000 mg | Freq: Once | INTRAMUSCULAR | Status: DC

## 2016-01-05 MED ORDER — LACTATED RINGERS IV SOLN: 1000.0000 mL | INTRAVENOUS | Status: DC

## 2016-01-05 MED ORDER — BUPIVACAINE HCL (PF) 0.25 % IJ SOLN
INTRAMUSCULAR | Status: AC
  Administered 2016-01-05: 13:00:00
  Filled 2016-01-05: qty 30

## 2016-01-05 MED ORDER — FENTANYL CITRATE (PF) 100 MCG/2ML IJ SOLN
INTRAMUSCULAR | Status: AC
Start: 2016-01-05 — End: 2016-01-05
  Administered 2016-01-05: 100 ug via INTRAVENOUS
  Filled 2016-01-05: qty 2

## 2016-01-05 MED ORDER — ORPHENADRINE CITRATE 30 MG/ML IJ SOLN: 60.0000 mg | Freq: Once | INTRAMUSCULAR | Status: DC

## 2016-01-05 MED ORDER — BUPIVACAINE HCL (PF) 0.25 % IJ SOLN: 30.0000 mL | Freq: Once | INTRAMUSCULAR | Status: DC

## 2016-01-05 MED ORDER — MIDAZOLAM HCL 5 MG/5ML IJ SOLN
INTRAMUSCULAR | Status: AC
  Administered 2016-01-05: 5 mg via INTRAVENOUS
  Filled 2016-01-05: qty 5

## 2016-01-05 MED ORDER — MIDAZOLAM HCL 5 MG/5ML IJ SOLN: 5.0000 mg | Freq: Once | INTRAMUSCULAR | Status: DC

## 2016-01-05 NOTE — Progress Notes (Signed)
   Subjective:    Patient ID: Cathy MurdochPatricia A Foley, female    DOB: 07/22/1976, 40 y.o.   MRN: [id number]  HPI  NOTE: The patient is a 40 y.o.-year-old female who [facility] further evaluation and treatment of pain consisting of pain involving the region of the neck and headache.  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 The patient is with severe tenderness to palpation and with reproduction of headaches with palpation over the splenius capitis and occipitalis musculature region. There is concern regarding significant component of patient's headaches being due to bilateral occipital neuralgia .  The risks, benefits, and expectations of the procedure have been discussed and explained to patient, who is understanding and wishes to proceed with interventional treatment as discussed and as explained to patient.  Will proceed with greater occipital nerve blocks with myoneural block injections at this time as discussed and as explained to patient.  All are understanding and in agreement with suggested treatment plan.    PROCEDURE:  Greater occipital nerve block on the left side with IV Versed, IV Fentanyl, conscious sedation, EKG, blood pressure, pulse, pulse oximetry monitoring.  Procedure performed with patient in prone position.  Greater occipital nerve block on the left side.   With patient in prone position, Betadine prep of proposed entry site accomplished.  Following identification of the nuchal ridge, 22 -gauge needle was inserted at the level of the nuchal ridge medial to the occipital artery.  Following negative aspiration, 4cc 0.25% bupivacaine with Kenalog injected for left greater occipital nerve block.  Needle was removed.  Patient tolerated injection well.   Greater occipital nerve block on the rightt side. The greater occipital nerve block on the right side was performed exactly as the left greater  occipital nerve block was performed and utilizing the same technique.  Myoneural block injections of the cervical region Following Betadine prep of proposed entry site a 22-gauge needle was inserted in the cervical paraspinal musculature region and following negative aspiration 2 cc of 0.25% bupivacaine with Norflex was injected for myoneural block injection of the cervical region times two.  The patient tolerated the procedure well   A total of 10 mg Kenalog was utilized for the entire procedure.  PLAN:    1. Medications: Will continue presently prescribed medications diclofenac and oxycodone at this time. 2. Patient to follow up with primary care physician  [name] for evaluation of blood pressure and general medical condition status post procedure performed on today's visit. 3. Neurological evaluation for further assessment of headaches for further studies as discussed. 4. Surgical evaluation as discussed.  5. Patient may be candidate for Botox injections, radiofrequency procedures, as well as implantation type procedures pending response to treatment rendered on today's visit and pending follow-up evaluation. 6. Patient has been advised to adhere to proper body mechanics and to avoid activities which appear to aggravate condition.cations:  Will continue presently prescribed medications at this time. 7. The patient is understanding and in agreement with the suggested treatment plan.   Review of Systems     Objective:   Physical Exam        Assessment & Plan:
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
## 2016-01-06 ENCOUNTER — Telehealth: Payer: Self-pay | Admitting: *Deleted

## 2016-01-06 NOTE — Telephone Encounter (Signed)
Patient verbalizes no complications from procedure on yesterday. 

## 2016-01-27 ENCOUNTER — Encounter: Payer: Self-pay | Admitting: Pain Medicine

## 2016-01-27 ENCOUNTER — Ambulatory Visit: Payer: Medicaid Other | Attending: Pain Medicine | Admitting: Pain Medicine

## 2016-01-27 VITALS — BP 133/87 | HR 115 | Temp 98.7°F | Resp 15 | Ht 62.0 in | Wt 183.0 lb

## 2016-01-27 DIAGNOSIS — Z9889 Other specified postprocedural states: Secondary | ICD-10-CM | POA: Diagnosis not present

## 2016-01-27 DIAGNOSIS — M5030 Other cervical disc degeneration, unspecified cervical region: Secondary | ICD-10-CM | POA: Diagnosis not present

## 2016-01-27 DIAGNOSIS — M47816 Spondylosis without myelopathy or radiculopathy, lumbar region: Secondary | ICD-10-CM

## 2016-01-27 DIAGNOSIS — M542 Cervicalgia: Secondary | ICD-10-CM | POA: Diagnosis present

## 2016-01-27 DIAGNOSIS — G43909 Migraine, unspecified, not intractable, without status migrainosus: Secondary | ICD-10-CM | POA: Diagnosis not present

## 2016-01-27 DIAGNOSIS — M5126 Other intervertebral disc displacement, lumbar region: Secondary | ICD-10-CM | POA: Insufficient documentation

## 2016-01-27 DIAGNOSIS — M5136 Other intervertebral disc degeneration, lumbar region: Secondary | ICD-10-CM | POA: Insufficient documentation

## 2016-01-27 DIAGNOSIS — G5603 Carpal tunnel syndrome, bilateral upper limbs: Secondary | ICD-10-CM

## 2016-01-27 DIAGNOSIS — M47818 Spondylosis without myelopathy or radiculopathy, sacral and sacrococcygeal region: Secondary | ICD-10-CM

## 2016-01-27 DIAGNOSIS — M170 Bilateral primary osteoarthritis of knee: Secondary | ICD-10-CM | POA: Insufficient documentation

## 2016-01-27 DIAGNOSIS — G43109 Migraine with aura, not intractable, without status migrainosus: Secondary | ICD-10-CM

## 2016-01-27 DIAGNOSIS — M461 Sacroiliitis, not elsewhere classified: Secondary | ICD-10-CM

## 2016-01-27 DIAGNOSIS — G5600 Carpal tunnel syndrome, unspecified upper limb: Secondary | ICD-10-CM | POA: Diagnosis not present

## 2016-01-27 DIAGNOSIS — M961 Postlaminectomy syndrome, not elsewhere classified: Secondary | ICD-10-CM

## 2016-01-27 DIAGNOSIS — M545 Low back pain: Secondary | ICD-10-CM | POA: Insufficient documentation

## 2016-01-27 DIAGNOSIS — R51 Headache: Secondary | ICD-10-CM | POA: Insufficient documentation

## 2016-01-27 DIAGNOSIS — M5481 Occipital neuralgia: Secondary | ICD-10-CM | POA: Diagnosis not present

## 2016-01-27 DIAGNOSIS — M533 Sacrococcygeal disorders, not elsewhere classified: Secondary | ICD-10-CM

## 2016-01-27 MED ORDER — OXYCODONE HCL 10 MG PO TABS: ORAL_TABLET | ORAL | Status: DC

## 2016-01-27 MED ORDER — DICLOFENAC SODIUM 1 % TD GEL: TRANSDERMAL | Status: DC
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
## 2016-01-27 NOTE — Progress Notes (Signed)
Safety precautions to be maintained throughout the outpatient stay will include: orient to surroundings, keep bed in low position, maintain call bell within reach at all times, provide assistance with transfer out of bed and ambulation.  

## 2016-02-04 ENCOUNTER — Encounter: Payer: Self-pay | Admitting: Pain Medicine

## 2016-02-04 ENCOUNTER — Ambulatory Visit: Payer: Medicaid Other | Attending: Pain Medicine | Admitting: Pain Medicine

## 2016-02-04 VITALS — BP 105/60 | HR 74 | Temp 97.2°F | Resp 16 | Ht 62.0 in | Wt 182.0 lb

## 2016-02-04 DIAGNOSIS — M47812 Spondylosis without myelopathy or radiculopathy, cervical region: Secondary | ICD-10-CM | POA: Insufficient documentation

## 2016-02-04 DIAGNOSIS — M5030 Other cervical disc degeneration, unspecified cervical region: Secondary | ICD-10-CM

## 2016-02-04 DIAGNOSIS — M542 Cervicalgia: Secondary | ICD-10-CM | POA: Insufficient documentation

## 2016-02-04 DIAGNOSIS — G5603 Carpal tunnel syndrome, bilateral upper limbs: Secondary | ICD-10-CM

## 2016-02-04 DIAGNOSIS — M961 Postlaminectomy syndrome, not elsewhere classified: Secondary | ICD-10-CM

## 2016-02-04 DIAGNOSIS — M47816 Spondylosis without myelopathy or radiculopathy, lumbar region: Secondary | ICD-10-CM

## 2016-02-04 DIAGNOSIS — R51 Headache: Secondary | ICD-10-CM | POA: Insufficient documentation

## 2016-02-04 DIAGNOSIS — M170 Bilateral primary osteoarthritis of knee: Secondary | ICD-10-CM

## 2016-02-04 DIAGNOSIS — G43109 Migraine with aura, not intractable, without status migrainosus: Secondary | ICD-10-CM

## 2016-02-04 DIAGNOSIS — M5481 Occipital neuralgia: Secondary | ICD-10-CM

## 2016-02-04 DIAGNOSIS — M47818 Spondylosis without myelopathy or radiculopathy, sacral and sacrococcygeal region: Secondary | ICD-10-CM

## 2016-02-04 DIAGNOSIS — M5136 Other intervertebral disc degeneration, lumbar region: Secondary | ICD-10-CM

## 2016-02-04 DIAGNOSIS — M533 Sacrococcygeal disorders, not elsewhere classified: Secondary | ICD-10-CM

## 2016-02-04 DIAGNOSIS — M461 Sacroiliitis, not elsewhere classified: Secondary | ICD-10-CM

## 2016-02-04 MED ORDER — BUPIVACAINE HCL (PF) 0.25 % IJ SOLN
INTRAMUSCULAR | Status: AC
  Administered 2016-02-04: 11:00:00
  Filled 2016-02-04: qty 30

## 2016-02-04 MED ORDER — TRIAMCINOLONE ACETONIDE 40 MG/ML IJ SUSP
40.0000 mg | Freq: Once | INTRAMUSCULAR | Status: DC
Start: 2016-02-04 — End: 2017-02-14

## 2016-02-04 MED ORDER — BUPIVACAINE HCL (PF) 0.25 % IJ SOLN: 30.0000 mL | Freq: Once | INTRAMUSCULAR | Status: DC

## 2016-02-04 MED ORDER — FENTANYL CITRATE (PF) 100 MCG/2ML IJ SOLN: 100.0000 ug | Freq: Once | INTRAMUSCULAR | Status: DC

## 2016-02-04 MED ORDER — MIDAZOLAM HCL 5 MG/5ML IJ SOLN
INTRAMUSCULAR | Status: AC
  Administered 2016-02-04: 3 mg via INTRAVENOUS
  Filled 2016-02-04: qty 5

## 2016-02-04 MED ORDER — LACTATED RINGERS IV SOLN: 1000.0000 mL | INTRAVENOUS | Status: DC

## 2016-02-04 MED ORDER — ORPHENADRINE CITRATE 30 MG/ML IJ SOLN
INTRAMUSCULAR | Status: AC
  Administered 2016-02-04: 11:00:00
  Filled 2016-02-04: qty 2

## 2016-02-04 MED ORDER — FENTANYL CITRATE (PF) 100 MCG/2ML IJ SOLN
INTRAMUSCULAR | Status: AC
  Administered 2016-02-04: 100 ug via INTRAVENOUS
  Filled 2016-02-04: qty 2

## 2016-02-04 MED ORDER — ORPHENADRINE CITRATE 30 MG/ML IJ SOLN: 60.0000 mg | Freq: Once | INTRAMUSCULAR | Status: DC

## 2016-02-04 MED ORDER — MIDAZOLAM HCL 5 MG/5ML IJ SOLN: 5.0000 mg | Freq: Once | INTRAMUSCULAR | Status: DC

## 2016-02-04 MED ORDER — TRIAMCINOLONE ACETONIDE 40 MG/ML IJ SUSP
INTRAMUSCULAR | Status: AC
  Administered 2016-02-04: 11:00:00
  Filled 2016-02-04: qty 1

## 2016-02-04 NOTE — Progress Notes (Signed)
Safety precautions to be maintained throughout the outpatient stay will include: orient to surroundings, keep bed in low position, maintain call bell within reach at all times, provide assistance with transfer out of bed and ambulation.  
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
## 2016-02-04 NOTE — Progress Notes (Signed)
   Subjective:    Patient ID: Cathy MurdochPatricia A Foley, female    DOB: 09/23/1976, 40 y.o.   MRN: [id number]  HPI  NOTE: The patient is a 40 y.o.-year-old female who [facility] for further evaluation and treatment of pain consisting of pain involving the region of the neck and headache.  Patient is with prior studies revealing patient to be with degenerative changes of cervical spine the patient is with headaches which radiates from the cervical region toward the occipital region and continues to the retro-orbital region. There is concern regarding significant component of patient's headaches been due to bilateral occipital neuralgia .  The risks, benefits, and expectations of the procedure have been discussed and explained to patient, who is understanding and wishes to proceed with interventional treatment as discussed and as explained to patient.  Will proceed with greater occipital nerve blocks with myoneural block injections at this time as discussed and as explained to patient.  All are understanding and in agreement with suggested treatment plan.    PROCEDURE:  Greater occipital nerve block on the left side with IV Versed, IV Fentanyl, conscious sedation, EKG, blood pressure, pulse, pulse oximetry monitoring.  Procedure performed with patient in prone position.  Greater occipital nerve block on the left side.   With patient in prone position, Betadine prep of proposed entry site accomplished.  Following identification of the nuchal ridge, 22 -gauge needle was inserted at the level of the nuchal ridge medial to the occipital artery.  Following negative aspiration, 4cc 0.25% bupivacaine with Kenalog injected for left greater occipital nerve block.  Needle was removed.  Patient tolerated injection well.   Greater occipital nerve block on the rightt side. The greater occipital nerve block on the right side was performed exactly as the left greater occipital nerve block was performed  and utilizing the same technique    Myoneural block injections of the cervical paraspinal musculature region. Following Betadine prep of proposed entry site a 22-gauge needle was inserted in the cervical paraspinal musculature region and following negative aspiration 2 cc of 0.25% bupivacaine with Norflex was injected for myoneural block injection of the cervical paraspinal musculature region 4  The patient tolerated the procedure well   A total of 10 mg Kenalog was utilized for the entire procedure.  PLAN:    1. Medications: Will continue presently prescribed medications diclofenac and oxycodone at this time. 2. Patient to follow up with primary care physician Dr. Allena Foley and [name] for evaluation of blood pressure and general medical condition status post procedure performed on today's visit. 3. Neurological evaluation for further assessment of headaches for further studies as discussed. 4. Surgical evaluation as discussed.  5. Patient may be candidate for Botox injections, radiofrequency procedures, as well as implantation type procedures pending response to treatment rendered on today's visit and pending follow-up evaluation. 6. Patient has been advised to adhere to proper body mechanics and to avoid activities which appear to aggravate condition.cations:  Will continue presently prescribed medications at this time. 7. The patient is understanding and in agreement with the suggested treatment plan.   Review of Systems     Objective:   Physical Exam        Assessment & Plan:

## 2016-02-05 ENCOUNTER — Telehealth: Payer: Self-pay | Admitting: *Deleted

## 2016-02-05 NOTE — Telephone Encounter (Signed)
No problems post procedure. 

## 2016-02-26 ENCOUNTER — Ambulatory Visit: Payer: Medicaid Other | Attending: Pain Medicine | Admitting: Pain Medicine

## 2016-02-26 ENCOUNTER — Encounter: Payer: Self-pay | Admitting: Pain Medicine

## 2016-02-26 VITALS — BP 117/76 | HR 105 | Temp 98.0°F | Resp 16 | Wt 186.0 lb

## 2016-02-26 DIAGNOSIS — G5600 Carpal tunnel syndrome, unspecified upper limb: Secondary | ICD-10-CM | POA: Diagnosis not present

## 2016-02-26 DIAGNOSIS — G43109 Migraine with aura, not intractable, without status migrainosus: Secondary | ICD-10-CM

## 2016-02-26 DIAGNOSIS — M79606 Pain in leg, unspecified: Secondary | ICD-10-CM | POA: Diagnosis present

## 2016-02-26 DIAGNOSIS — M170 Bilateral primary osteoarthritis of knee: Secondary | ICD-10-CM | POA: Diagnosis not present

## 2016-02-26 DIAGNOSIS — M5030 Other cervical disc degeneration, unspecified cervical region: Secondary | ICD-10-CM | POA: Insufficient documentation

## 2016-02-26 DIAGNOSIS — G43909 Migraine, unspecified, not intractable, without status migrainosus: Secondary | ICD-10-CM | POA: Insufficient documentation

## 2016-02-26 DIAGNOSIS — M5481 Occipital neuralgia: Secondary | ICD-10-CM | POA: Insufficient documentation

## 2016-02-26 DIAGNOSIS — M47816 Spondylosis without myelopathy or radiculopathy, lumbar region: Secondary | ICD-10-CM | POA: Diagnosis not present

## 2016-02-26 DIAGNOSIS — M461 Sacroiliitis, not elsewhere classified: Secondary | ICD-10-CM

## 2016-02-26 DIAGNOSIS — M5136 Other intervertebral disc degeneration, lumbar region: Secondary | ICD-10-CM | POA: Insufficient documentation

## 2016-02-26 DIAGNOSIS — G5603 Carpal tunnel syndrome, bilateral upper limbs: Secondary | ICD-10-CM

## 2016-02-26 DIAGNOSIS — M4806 Spinal stenosis, lumbar region: Secondary | ICD-10-CM | POA: Insufficient documentation

## 2016-02-26 DIAGNOSIS — Z9889 Other specified postprocedural states: Secondary | ICD-10-CM | POA: Insufficient documentation

## 2016-02-26 DIAGNOSIS — M47818 Spondylosis without myelopathy or radiculopathy, sacral and sacrococcygeal region: Secondary | ICD-10-CM

## 2016-02-26 DIAGNOSIS — M533 Sacrococcygeal disorders, not elsewhere classified: Secondary | ICD-10-CM

## 2016-02-26 DIAGNOSIS — M5126 Other intervertebral disc displacement, lumbar region: Secondary | ICD-10-CM | POA: Insufficient documentation

## 2016-02-26 DIAGNOSIS — M542 Cervicalgia: Secondary | ICD-10-CM | POA: Diagnosis present

## 2016-02-26 DIAGNOSIS — M546 Pain in thoracic spine: Secondary | ICD-10-CM | POA: Diagnosis present

## 2016-02-26 DIAGNOSIS — M961 Postlaminectomy syndrome, not elsewhere classified: Secondary | ICD-10-CM

## 2016-02-26 MED ORDER — DICLOFENAC POTASSIUM 50 MG PO TABS: ORAL_TABLET | ORAL | Status: DC

## 2016-02-26 MED ORDER — OXYCODONE HCL 10 MG PO TABS: ORAL_TABLET | ORAL | Status: DC

## 2016-02-26 NOTE — Progress Notes (Signed)
Safety precautions to be maintained throughout the outpatient stay will include: orient to surroundings, keep bed in low position, maintain call bell within reach at all times, provide assistance with transfer out of bed and ambulation.  
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
## 2016-03-01 ENCOUNTER — Telehealth: Payer: Self-pay

## 2016-03-01 NOTE — Telephone Encounter (Signed)
PA for myrbetriq 50mg  APPROVED. Approval [phone]#[id number]

## 2016-03-04 LAB — SPECIMEN STATUS REPORT

## 2016-03-04 LAB — TOXASSURE SELECT 13 (MW), URINE

## 2016-03-04 NOTE — Progress Notes (Signed)
Quick Note:  Reviewed. ______ 

## 2016-03-23 ENCOUNTER — Ambulatory Visit: Payer: Medicaid Other | Attending: Pain Medicine | Admitting: Pain Medicine

## 2016-03-23 ENCOUNTER — Telehealth: Payer: Self-pay | Admitting: Pain Medicine

## 2016-03-23 ENCOUNTER — Encounter: Payer: Self-pay | Admitting: Pain Medicine

## 2016-03-23 VITALS — BP 121/93 | HR 96 | Temp 98.0°F | Resp 18 | Ht 62.0 in | Wt 182.0 lb

## 2016-03-23 DIAGNOSIS — Z9889 Other specified postprocedural states: Secondary | ICD-10-CM | POA: Diagnosis not present

## 2016-03-23 DIAGNOSIS — M533 Sacrococcygeal disorders, not elsewhere classified: Secondary | ICD-10-CM

## 2016-03-23 DIAGNOSIS — G43909 Migraine, unspecified, not intractable, without status migrainosus: Secondary | ICD-10-CM | POA: Insufficient documentation

## 2016-03-23 DIAGNOSIS — M5481 Occipital neuralgia: Secondary | ICD-10-CM | POA: Diagnosis not present

## 2016-03-23 DIAGNOSIS — M5136 Other intervertebral disc degeneration, lumbar region: Secondary | ICD-10-CM | POA: Diagnosis not present

## 2016-03-23 DIAGNOSIS — M170 Bilateral primary osteoarthritis of knee: Secondary | ICD-10-CM | POA: Insufficient documentation

## 2016-03-23 DIAGNOSIS — M461 Sacroiliitis, not elsewhere classified: Secondary | ICD-10-CM

## 2016-03-23 DIAGNOSIS — M47818 Spondylosis without myelopathy or radiculopathy, sacral and sacrococcygeal region: Secondary | ICD-10-CM

## 2016-03-23 DIAGNOSIS — M47816 Spondylosis without myelopathy or radiculopathy, lumbar region: Secondary | ICD-10-CM | POA: Diagnosis not present

## 2016-03-23 DIAGNOSIS — G5603 Carpal tunnel syndrome, bilateral upper limbs: Secondary | ICD-10-CM

## 2016-03-23 DIAGNOSIS — M5030 Other cervical disc degeneration, unspecified cervical region: Secondary | ICD-10-CM | POA: Insufficient documentation

## 2016-03-23 DIAGNOSIS — G43109 Migraine with aura, not intractable, without status migrainosus: Secondary | ICD-10-CM

## 2016-03-23 DIAGNOSIS — M542 Cervicalgia: Secondary | ICD-10-CM | POA: Diagnosis present

## 2016-03-23 DIAGNOSIS — M961 Postlaminectomy syndrome, not elsewhere classified: Secondary | ICD-10-CM

## 2016-03-23 DIAGNOSIS — M4806 Spinal stenosis, lumbar region: Secondary | ICD-10-CM | POA: Insufficient documentation

## 2016-03-23 DIAGNOSIS — R51 Headache: Secondary | ICD-10-CM | POA: Diagnosis present

## 2016-03-23 DIAGNOSIS — G5600 Carpal tunnel syndrome, unspecified upper limb: Secondary | ICD-10-CM | POA: Diagnosis not present

## 2016-03-23 MED ORDER — OXYCODONE HCL 10 MG PO TABS: ORAL_TABLET | ORAL | Status: DC
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
## 2016-03-23 NOTE — Telephone Encounter (Signed)
Patient wants to fill meds early, she is leaving Thurs to go to [place] for funeral, please call pharmacy and let them know

## 2016-03-23 NOTE — Progress Notes (Signed)
Safety precautions to be maintained throughout the outpatient stay will include: orient to surroundings, keep bed in low position, maintain call bell within reach at all times, provide assistance with transfer out of bed and ambulation.  
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
## 2016-03-24 NOTE — Telephone Encounter (Signed)
Nurses Please review patient's medications. Patient is requesting an early refill and would like for us to call pharmacy so that she can go out of town. Please discuss with me today Thank you

## 2016-03-28 LAB — TOXASSURE SELECT 13 (MW), URINE

## 2016-03-30 NOTE — Progress Notes (Signed)
Quick Note:  Reviewed. ______ 

## 2016-04-05 ENCOUNTER — Ambulatory Visit: Payer: Medicaid Other | Admitting: Pain Medicine

## 2016-04-05 ENCOUNTER — Telehealth: Payer: Self-pay | Admitting: *Deleted

## 2016-04-05 MED ORDER — LACTATED RINGERS IV SOLN: 1000.0000 mL | INTRAVENOUS | Status: DC

## 2016-04-05 MED ORDER — BUPIVACAINE HCL (PF) 0.25 % IJ SOLN: 30.0000 mL | Freq: Once | INTRAMUSCULAR | Status: DC

## 2016-04-05 MED ORDER — ORPHENADRINE CITRATE 30 MG/ML IJ SOLN: 60.0000 mg | Freq: Once | INTRAMUSCULAR | Status: DC

## 2016-04-05 MED ORDER — FENTANYL CITRATE (PF) 100 MCG/2ML IJ SOLN: 100.0000 ug | Freq: Once | INTRAMUSCULAR | Status: DC

## 2016-04-05 MED ORDER — MIDAZOLAM HCL 5 MG/5ML IJ SOLN: 5.0000 mg | Freq: Once | INTRAMUSCULAR | Status: DC

## 2016-04-05 MED ORDER — TRIAMCINOLONE ACETONIDE 40 MG/ML IJ SUSP: 40.0000 mg | Freq: Once | INTRAMUSCULAR | Status: DC
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
## 2016-04-08 ENCOUNTER — Encounter: Payer: Self-pay | Admitting: *Deleted

## 2016-04-08 ENCOUNTER — Emergency Department
Admission: EM | Admit: 2016-04-08 | Discharge: 2016-04-08 | Disposition: A | Discharge status: Home / Self Care | Payer: Medicaid Other | Attending: Emergency Medicine | Admitting: Emergency Medicine

## 2016-04-08 DIAGNOSIS — J45909 Unspecified asthma, uncomplicated: Secondary | ICD-10-CM | POA: Diagnosis not present

## 2016-04-08 DIAGNOSIS — Z859 Personal history of malignant neoplasm, unspecified: Secondary | ICD-10-CM | POA: Insufficient documentation

## 2016-04-08 DIAGNOSIS — F17210 Nicotine dependence, cigarettes, uncomplicated: Secondary | ICD-10-CM | POA: Diagnosis not present

## 2016-04-08 DIAGNOSIS — M179 Osteoarthritis of knee, unspecified: Secondary | ICD-10-CM | POA: Insufficient documentation

## 2016-04-08 DIAGNOSIS — M5136 Other intervertebral disc degeneration, lumbar region: Secondary | ICD-10-CM | POA: Diagnosis not present

## 2016-04-08 DIAGNOSIS — K922 Gastrointestinal hemorrhage, unspecified: Secondary | ICD-10-CM

## 2016-04-08 DIAGNOSIS — K625 Hemorrhage of anus and rectum: Secondary | ICD-10-CM | POA: Insufficient documentation

## 2016-04-08 DIAGNOSIS — R112 Nausea with vomiting, unspecified: Secondary | ICD-10-CM | POA: Diagnosis present

## 2016-04-08 DIAGNOSIS — M5030 Other cervical disc degeneration, unspecified cervical region: Secondary | ICD-10-CM | POA: Insufficient documentation

## 2016-04-08 DIAGNOSIS — Z79899 Other long term (current) drug therapy: Secondary | ICD-10-CM | POA: Diagnosis not present

## 2016-04-08 LAB — CBC
HCT: 39.5 % (ref 35.0–47.0)
HEMOGLOBIN: 13.5 g/dL (ref 12.0–16.0)
MCH: 29.4 pg (ref 26.0–34.0)
MCHC: 34.1 g/dL (ref 32.0–36.0)
MCV: 86.4 fL (ref 80.0–100.0)
PLATELETS: 379 10*3/uL (ref 150–440)
RBC: 4.57 MIL/uL (ref 3.80–5.20)
RDW: 13.1 % (ref 11.5–14.5)
WBC: 10.8 10*3/uL (ref 3.6–11.0)

## 2016-04-08 LAB — COMPREHENSIVE METABOLIC PANEL
ALK PHOS: 65 U/L (ref 38–126)
ALT: 15 U/L (ref 14–54)
ANION GAP: 7 (ref 5–15)
AST: 16 U/L (ref 15–41)
Albumin: 3.4 g/dL — ABNORMAL LOW (ref 3.5–5.0)
BILIRUBIN TOTAL: 0.3 mg/dL (ref 0.3–1.2)
BUN: 14 mg/dL (ref 6–20)
CALCIUM: 9 mg/dL (ref 8.9–10.3)
CO2: 24 mmol/L (ref 22–32)
Chloride: 109 mmol/L (ref 101–111)
Creatinine, Ser: 0.72 mg/dL (ref 0.44–1.00)
Glucose, Bld: 88 mg/dL (ref 65–99)
Potassium: 3.4 mmol/L — ABNORMAL LOW (ref 3.5–5.1)
Sodium: 140 mmol/L (ref 135–145)
TOTAL PROTEIN: 7 g/dL (ref 6.5–8.1)

## 2016-04-08 LAB — URINALYSIS COMPLETE WITH MICROSCOPIC (ARMC ONLY)
Bacteria, UA: NONE SEEN
Bilirubin Urine: NEGATIVE
Glucose, UA: NEGATIVE mg/dL
Ketones, ur: NEGATIVE mg/dL
Leukocytes, UA: NEGATIVE
Nitrite: NEGATIVE
PH: 5 (ref 5.0–8.0)
PROTEIN: NEGATIVE mg/dL
Specific Gravity, Urine: 1.027 (ref 1.005–1.030)

## 2016-04-08 LAB — LIPASE, BLOOD: Lipase: 15 U/L (ref 11–51)

## 2016-04-08 MED ORDER — PRAMOXINE HCL 1 % RE FOAM: 1.0000 "application " | Freq: Three times a day (TID) | RECTAL | Status: DC | PRN

## 2016-04-08 NOTE — ED Provider Notes (Signed)
[facility] Emergency Department Provider Note   ____________________________________________  Time seen: Approximately 315 AM  I have reviewed the triage vital signs and the nursing notes.   HISTORY  Chief Complaint Emesis   HPI [name] is a 40 y.o. female with a history of seizures as well as chronic lower abdominal pain and nausea and vomiting was present to the emergency department today with bloody stools. She says that over the past week she has been diagnosed with bronchitis and just finished a course of azithromycin this past Tuesday. She says that she has had an increased amount of nausea and vomiting but says that over the last year she deals with her nausea and vomiting on an almost daily basis. She says were brought her into the emergency department was 1-2 episodes of bloody diarrhea. She says that she had a watery stool with blood that looked like "I was miscarrying." She has a history of hemorrhoids that she says she has had since pregnancy but usually does not have an issue with them.   Past Medical History  Diagnosis Date  . Seizures (HCC)   . Hypothyroidism   . GERD (gastroesophageal reflux disease)   . Cancer (HCC)   . Asthma     Patient Active Problem List   Diagnosis Date Noted  . Sacroiliac joint dysfunction 06/03/2015  . DJD (degenerative joint disease) of knee 03/28/2015  . DDD (degenerative disc disease), lumbar 03/06/2015  . Lumbar post-laminectomy syndrome 03/06/2015  . DDD (degenerative disc disease), cervical 03/06/2015  . Degenerative joint disease of sacroiliac joint 03/06/2015  . Bilateral occipital neuralgia 03/06/2015  . Adnexal pain 07/13/2013  . Female genuine stress incontinence 07/13/2013  . Urge incontinence 07/13/2013  . FOM (frequency of micturition) 07/13/2013    Past Surgical History  Procedure Laterality Date  . Tubal ligation    . Vaginal hysterectomy  2007      due to fibroid cysts  .  Removal of l ovary Left 2012    due a cyst and scar tissue  . Removal of bilateral fallopian tubes Bilateral 2012  . Cesarean section  2003    with tubal ligation   . Cholesteatoma excision  2010    to remove a benign tumor behind R ear drum   . Spine surgery  2012    2 surgeries: to remove a herniated disc and to remove a pus pocket at incision site     Current Outpatient Rx  Name  Route  Sig  Dispense  Refill  . alprazolam (XANAX) 2 MG tablet   Oral   Take 2 mg by mouth 4 (four) times daily as needed for sleep.         Marland Kitchen amphetamine-dextroamphetamine (ADDERALL) 30 MG tablet   Oral   Take 30 mg by mouth 2 (two) times daily.         . cetirizine (ZYRTEC) 10 MG tablet   Oral   Take by mouth. Reported on 03/23/2016         . conjugated estrogens (PREMARIN) vaginal cream   Vaginal   Place 1 Applicatorful vaginally daily.         . diclofenac (CATAFLAM) 50 MG tablet      1 tab by mouth per day or twice a day if tolerated   60 tablet   2   . fluticasone (FLOVENT HFA) 220 MCG/ACT inhaler   Inhalation   Inhale 2 puffs into the lungs every 4 (four) hours  as needed.         Marland Kitchen levothyroxine (SYNTHROID, LEVOTHROID) 150 MCG tablet   Oral   Take 150 mcg by mouth. Every other day         . levothyroxine (SYNTHROID, LEVOTHROID) 175 MCG tablet   Oral   Take 175 mcg by mouth. Every other day         . loratadine (CLARITIN) 10 MG tablet   Oral   Take 10 mg by mouth daily.         . mirabegron ER (MYRBETRIQ) 50 MG TB24 tablet   Oral   Take 1 tablet (50 mg total) by mouth daily.   30 tablet   11   . omeprazole (PRILOSEC) 40 MG capsule   Oral   Take 40 mg by mouth daily.         . Oxycodone HCl 10 MG TABS      1-2 tablets by mouth twice a day to 3 times a day if tolerated   180 tablet   0   . traMADol (ULTRAM) 50 MG tablet   Oral   Take by mouth. Reported on 03/23/2016           Allergies Celecoxib; Darvon; and Gabapentin  Family History    Problem Relation Age of Onset  . Alcohol abuse Mother   . Arthritis Mother   . Depression Mother   . Hearing loss Mother   . Alcohol abuse Father   . Arthritis Father   . COPD Father   . Depression Father   . Hearing loss Father   . Hyperlipidemia Father   . Hypertension Father     Social History Social History  Substance Use Topics  . Smoking status: Current Every Day Smoker -- 1.00 packs/day    Types: Cigarettes  . Smokeless tobacco: None  . Alcohol Use: No     Comment: occasionally    Review of Systems Constitutional: No fever/chills Eyes: No visual changes. ENT: No sore throat. Cardiovascular: Denies chest pain. Respiratory: Denies shortness of breath. Gastrointestinal:  No constipation. Genitourinary: Negative for dysuria. Musculoskeletal: Negative for back pain. Skin: Negative for rash. Neurological: Negative for headaches, focal weakness or numbness.  10-point ROS otherwise negative.  ____________________________________________   PHYSICAL EXAM:  VITAL SIGNS: ED Triage Vitals  Enc Vitals Group     BP 04/08/16 0046 125/77 mmHg     Pulse Rate 04/08/16 0046 105     Resp 04/08/16 0046 18     Temp 04/08/16 0046 98.1 F (36.7 C)     Temp Source 04/08/16 0046 Oral     SpO2 04/08/16 0046 98 %     Weight 04/08/16 0046 184 lb (83.462 kg)     Height 04/08/16 0046 5\' 2"  (1.575 m)     Head Cir --      Peak Flow --      Pain Score 04/08/16 0048 8     Pain Loc --      Pain Edu? --      Excl. in GC? --     Constitutional: Alert and oriented. Well appearing and in no acute distress. Eyes: Conjunctivae are normal. PERRL. EOMI. Head: Atraumatic. Nose: No congestion/rhinnorhea. Mouth/Throat: Mucous membranes are moist.   Neck: No stridor.   Cardiovascular: Normal rate, regular rhythm. Grossly normal heart sounds.  Good peripheral circulation. Respiratory: Normal respiratory effort.  No retractions. Lungs CTAB. Gastrointestinal: Soft With mild suprapubic  tenderness which she says is chronic for her. No distention.  No CVA tenderness. Rectal exam with small amount engorged hemorrhoids at 12 as well as 6:00. Rectal exam without any stool or blood on the glove. Hemoccult negative. Musculoskeletal: No lower extremity tenderness nor edema.  No joint effusions. Neurologic:  Normal speech and language. No gross focal neurologic deficits are appreciated. Skin:  Skin is warm, dry and intact. No rash noted. Psychiatric: Mood and affect are normal. Speech and behavior are normal.  ____________________________________________   LABS (all labs ordered are listed, but only abnormal results are displayed)  Labs Reviewed  COMPREHENSIVE METABOLIC PANEL - Abnormal; Notable for the following:    Potassium 3.4 (*)    Albumin 3.4 (*)    All other components within normal limits  URINALYSIS COMPLETEWITH MICROSCOPIC ([facility] ONLY) - Abnormal; Notable for the following:    Color, Urine YELLOW (*)    APPearance CLEAR (*)    Hgb urine dipstick 2+ (*)    Squamous Epithelial / LPF 0-5 (*)    All other components within normal limits  LIPASE, BLOOD  CBC   ____________________________________________  EKG   ____________________________________________  RADIOLOGY   ____________________________________________   PROCEDURES   ____________________________________________   INITIAL IMPRESSION / ASSESSMENT AND PLAN / ED COURSE  Pertinent labs & imaging results that were available during my care of the patient were reviewed by me and considered in my medical decision making (see chart for details).  ----------------------------------------- 3:54 AM on 04/08/2016 -----------------------------------------  Patient with very reassuring lab workup. I feel that she likely had a hemorrhoid bleed secondary to diarrhea. We'll prescribe Proctofoam the patient may use as needed. We also discussed using Preparation H. The patient will be following up with her  primary care doctor. Will be discharged home. She knows that she may return to the hospital any point for any worsening or concerning symptoms. I do not believe she is having a major gastric intestinal hemorrhage at this time. ____________________________________________   FINAL CLINICAL IMPRESSION(S) / ED DIAGNOSES  Prior to blood per rectum.    NEW MEDICATIONS STARTED DURING THIS VISIT:  New Prescriptions   No medications on file     Note:  This document was prepared using Dragon voice recognition software and may include unintentional dictation errors.    Myrna Blazeravid Matthew Schaevitz, MD 04/08/16 [phone]

## 2016-04-08 NOTE — ED Notes (Signed)
Pt ambulatory to triage. Pt reports vomiting and diarrhea.  Pt states she saw blood in toilet.  Pt has cramping pain in abd.

## 2016-04-08 NOTE — ED Notes (Addendum)
Pt reports vomiting for the last couple of days - She reports that sometimes she vomits while eating - She states she just got over sinus infection and bronchitis - Last night she was vomiting and had diarrhea - Pt reports diarrhea had bright red blood in it - Pt has vomiting 4 times in the last 24 hours - Pt states she has lower abd pain radiating to right side - Chronic back pain - Pt sees a urologist for lower abd pain and they have not been able to determine what the problem is or where the pain is coming from

## 2016-04-08 NOTE — ED Notes (Signed)
Pt left without dc papers didn't say anything just left.

## 2016-04-08 NOTE — Discharge Instructions (Signed)
Gastrointestinal Bleeding °Gastrointestinal bleeding is bleeding somewhere along the path that food travels through the body (digestive tract). This path is anywhere between the mouth and the opening of the butt (anus). You may have blood in your throw up (vomit) or in your poop (stools). If there is a lot of bleeding, you may need to stay in the hospital. °HOME CARE °· Only take medicine as told by your doctor. °· Eat foods with fiber such as whole grains, fruits, and vegetables. You can also try eating 1 to 3 prunes a day. °· Drink enough fluids to keep your pee (urine) clear or pale yellow. °GET HELP RIGHT AWAY IF:  °· Your bleeding gets worse. °· You feel dizzy, weak, or you pass out (faint). °· You have bad cramps in your back or belly (abdomen). °· You have large blood clumps (clots) in your poop. °· Your problems are getting worse. °MAKE SURE YOU:  °· Understand these instructions. °· Will watch your condition. °· Will get help right away if you are not doing well or get worse. °  °This information is not intended to replace advice given to you by your health care provider. Make sure you discuss any questions you have with your health care provider. °  °Document Released: 07/20/2008 Document Revised: 09/27/2012 Document Reviewed: 03/31/2015 °Elsevier Interactive Patient Education ©2016 [employer] ° °

## 2016-04-20 ENCOUNTER — Encounter: Payer: Self-pay | Admitting: Pain Medicine

## 2016-04-20 ENCOUNTER — Ambulatory Visit: Payer: Medicaid Other | Attending: Pain Medicine | Admitting: Pain Medicine

## 2016-04-20 VITALS — BP 116/70 | HR 107 | Temp 98.1°F | Resp 16 | Ht 62.0 in | Wt 200.0 lb

## 2016-04-20 DIAGNOSIS — G43909 Migraine, unspecified, not intractable, without status migrainosus: Secondary | ICD-10-CM | POA: Diagnosis not present

## 2016-04-20 DIAGNOSIS — M4806 Spinal stenosis, lumbar region: Secondary | ICD-10-CM | POA: Diagnosis not present

## 2016-04-20 DIAGNOSIS — Z9889 Other specified postprocedural states: Secondary | ICD-10-CM | POA: Insufficient documentation

## 2016-04-20 DIAGNOSIS — M5481 Occipital neuralgia: Secondary | ICD-10-CM | POA: Diagnosis not present

## 2016-04-20 DIAGNOSIS — R51 Headache: Secondary | ICD-10-CM | POA: Diagnosis present

## 2016-04-20 DIAGNOSIS — M961 Postlaminectomy syndrome, not elsewhere classified: Secondary | ICD-10-CM

## 2016-04-20 DIAGNOSIS — M5136 Other intervertebral disc degeneration, lumbar region: Secondary | ICD-10-CM | POA: Diagnosis not present

## 2016-04-20 DIAGNOSIS — G5603 Carpal tunnel syndrome, bilateral upper limbs: Secondary | ICD-10-CM

## 2016-04-20 DIAGNOSIS — M47816 Spondylosis without myelopathy or radiculopathy, lumbar region: Secondary | ICD-10-CM

## 2016-04-20 DIAGNOSIS — M533 Sacrococcygeal disorders, not elsewhere classified: Secondary | ICD-10-CM | POA: Insufficient documentation

## 2016-04-20 DIAGNOSIS — M170 Bilateral primary osteoarthritis of knee: Secondary | ICD-10-CM | POA: Insufficient documentation

## 2016-04-20 DIAGNOSIS — G5600 Carpal tunnel syndrome, unspecified upper limb: Secondary | ICD-10-CM | POA: Insufficient documentation

## 2016-04-20 DIAGNOSIS — M5030 Other cervical disc degeneration, unspecified cervical region: Secondary | ICD-10-CM | POA: Diagnosis not present

## 2016-04-20 DIAGNOSIS — M5126 Other intervertebral disc displacement, lumbar region: Secondary | ICD-10-CM | POA: Diagnosis not present

## 2016-04-20 DIAGNOSIS — M461 Sacroiliitis, not elsewhere classified: Secondary | ICD-10-CM

## 2016-04-20 DIAGNOSIS — M542 Cervicalgia: Secondary | ICD-10-CM | POA: Diagnosis present

## 2016-04-20 DIAGNOSIS — M47818 Spondylosis without myelopathy or radiculopathy, sacral and sacrococcygeal region: Secondary | ICD-10-CM

## 2016-04-20 DIAGNOSIS — G43109 Migraine with aura, not intractable, without status migrainosus: Secondary | ICD-10-CM

## 2016-04-20 MED ORDER — DICLOFENAC SODIUM 1 % TD GEL: TRANSDERMAL | Status: AC

## 2016-04-20 MED ORDER — OXYCODONE HCL 10 MG PO TABS: ORAL_TABLET | ORAL | Status: DC

## 2016-04-20 NOTE — Progress Notes (Signed)
Safety precautions to be maintained throughout the outpatient stay will include: orient to surroundings, keep bed in low position, maintain call bell within reach at all times, provide assistance with transfer out of bed and ambulation.  
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
## 2016-05-05 ENCOUNTER — Encounter: Payer: Self-pay | Admitting: Pain Medicine

## 2016-05-05 ENCOUNTER — Ambulatory Visit: Payer: Medicaid Other | Attending: Pain Medicine | Admitting: Pain Medicine

## 2016-05-05 VITALS — BP 97/52 | HR 69 | Temp 98.0°F | Resp 16 | Ht 62.0 in | Wt 191.0 lb

## 2016-05-05 DIAGNOSIS — G43109 Migraine with aura, not intractable, without status migrainosus: Secondary | ICD-10-CM

## 2016-05-05 DIAGNOSIS — M5030 Other cervical disc degeneration, unspecified cervical region: Secondary | ICD-10-CM

## 2016-05-05 DIAGNOSIS — M5136 Other intervertebral disc degeneration, lumbar region: Secondary | ICD-10-CM | POA: Diagnosis not present

## 2016-05-05 DIAGNOSIS — Z9889 Other specified postprocedural states: Secondary | ICD-10-CM | POA: Diagnosis not present

## 2016-05-05 DIAGNOSIS — M461 Sacroiliitis, not elsewhere classified: Secondary | ICD-10-CM

## 2016-05-05 DIAGNOSIS — M545 Low back pain: Secondary | ICD-10-CM | POA: Diagnosis present

## 2016-05-05 DIAGNOSIS — M4806 Spinal stenosis, lumbar region: Secondary | ICD-10-CM | POA: Insufficient documentation

## 2016-05-05 DIAGNOSIS — M961 Postlaminectomy syndrome, not elsewhere classified: Secondary | ICD-10-CM

## 2016-05-05 DIAGNOSIS — M170 Bilateral primary osteoarthritis of knee: Secondary | ICD-10-CM

## 2016-05-05 DIAGNOSIS — M51369 Other intervertebral disc degeneration, lumbar region without mention of lumbar back pain or lower extremity pain: Secondary | ICD-10-CM

## 2016-05-05 DIAGNOSIS — M47816 Spondylosis without myelopathy or radiculopathy, lumbar region: Secondary | ICD-10-CM | POA: Diagnosis not present

## 2016-05-05 DIAGNOSIS — M47818 Spondylosis without myelopathy or radiculopathy, sacral and sacrococcygeal region: Secondary | ICD-10-CM

## 2016-05-05 DIAGNOSIS — M533 Sacrococcygeal disorders, not elsewhere classified: Secondary | ICD-10-CM

## 2016-05-05 DIAGNOSIS — G5603 Carpal tunnel syndrome, bilateral upper limbs: Secondary | ICD-10-CM

## 2016-05-05 DIAGNOSIS — M5481 Occipital neuralgia: Secondary | ICD-10-CM

## 2016-05-05 MED ORDER — ORPHENADRINE CITRATE 30 MG/ML IJ SOLN
60.0000 mg | Freq: Once | INTRAMUSCULAR | Status: AC
  Administered 2016-05-05: 60 mg via INTRAMUSCULAR
  Filled 2016-05-05: qty 2

## 2016-05-05 MED ORDER — MIDAZOLAM HCL 5 MG/5ML IJ SOLN
5.0000 mg | Freq: Once | INTRAMUSCULAR | Status: AC
  Administered 2016-05-05: 5 mg via INTRAVENOUS
  Filled 2016-05-05: qty 5

## 2016-05-05 MED ORDER — LACTATED RINGERS IV SOLN
1000.0000 mL | INTRAVENOUS | Status: DC
  Administered 2016-05-05: 1000 mL via INTRAVENOUS

## 2016-05-05 MED ORDER — TRIAMCINOLONE ACETONIDE 40 MG/ML IJ SUSP
40.0000 mg | Freq: Once | INTRAMUSCULAR | Status: AC
  Administered 2016-05-05: 40 mg
  Filled 2016-05-05: qty 1

## 2016-05-05 MED ORDER — BUPIVACAINE HCL (PF) 0.25 % IJ SOLN
30.0000 mL | Freq: Once | INTRAMUSCULAR | Status: AC
  Administered 2016-05-05: 30 mL
  Filled 2016-05-05: qty 30

## 2016-05-05 MED ORDER — FENTANYL CITRATE (PF) 100 MCG/2ML IJ SOLN
100.0000 ug | Freq: Once | INTRAMUSCULAR | Status: AC
  Administered 2016-05-05: 100 ug via INTRAVENOUS
  Filled 2016-05-05: qty 2

## 2016-05-05 NOTE — Progress Notes (Signed)
Subjective:    Patient ID: [name], female    DOB: Apr 27, 1976, 40 y.o.   MRN: [id number]  HPI  PROCEDURE:  Block of nerves to the sacroiliac joint.   NOTE:  The patient is a 40 y.o. female who returns to the [facility] for further evaluation and treatment of pain involving the lower back and lower extremity region with pain in the region of the buttocks as well. Prior MRI studies reveal degenerative disc disease lumbar spine Status post prior hemilaminectomy L2, degenerative changes lumbar spine with stenosis and moderate bilateral foraminal stenosis, L3-4 degenerative disc disease and facet changes with diffuse disc bulging and disc material abutting the L3 nerve root in the foraminal zone, L4-5 degenerative disc disease, diffuse facet changes noted throughout the lumbar spine.  the patient is with reproduction of severely disabling pain with palpation over the PSIS and PII S regions  There is concern regarding a significant component of the patient's pain being due to sacroiliac joint dysfunction The risks, benefits, expectations of the procedure have been discussed and explained to the patient who is understanding and willing to proceed with interventional treatment in attempt to decrease severity of patient's symptoms, minimize the risk of medication escalation and  hopefully retard the progression of the patient's symptoms. We will proceed with what is felt to be a medically necessary procedure, block of nerves to the sacroiliac joint.   DESCRIPTION OF PROCEDURE:  Block of nerves to the sacroiliac joint.   The patient was taken to the fluoroscopy suite. With the patient in the prone position with EKG, blood pressure, pulse, capnography, and pulse oximetry monitoring, IV Versed, IV fentanyl conscious sedation, Betadine prep of proposed entry site was performed.   Block of nerves at the L5 vertebral body level.   With the patient in prone position, under fluoroscopic  guidance, a 22 -gauge needle was inserted at the L5 vertebral body level on the left side. With 15 degrees oblique orientation a 22 -gauge needle was inserted in the region known as Burton's eye or eye of the Scotty dog. Following documentation of needle placement in the area of Burton's eye or eye of the Scotty dog under fluoroscopic guidance, needle placement was then accomplished at the sacral ala level on the left side.   Needle placement at the sacral ala.   With the patient in prone position under fluoroscopic guidance with AP view of the lumbosacral spine, a 22 -gauge needle was inserted in the region known as the sacral ala on the left side. Following documentation of needle placement on the left side under fluoroscopic guidance needle placement was then accomplished at the S1 foramen level.   Needle placement at the S1 foramen level.   With the patient in prone position under fluoroscopic guidance with AP view of the lumbosacral spine and cephalad orientation, a 22 -gauge needle was inserted at the superior and lateral border of the S1 foramen on the left side. Following documentation of needle placement at the S1 foramen level on the left side, needle placement was then accomplished at the S2 foramen level on the left side.   Needle placement at the S2 foramen level.   With the patient in prone position with AP view of the lumbosacral spine with cephalad orientation, a 22 - gauge needle was inserted at the superior and lateral border of the S2 foramen under fluoroscopic guidance on the left side. Following needle placement at the L5 vertebral body level, sacral ala,  S1 foramen and S2 foramen on the left side, needle placement was verified on lateral view under fluoroscopic guidance.  Following needle placement documentation on lateral view, each needle was injected with 1 mL of 0.25% bupivacaine and Kenalog.   BLOCK OF THE NERVES TO SACROILIAC JOINT ON THE RIGHT SIDE The procedure was  performed on the right side at the same levels as was performed on the left side and utilizing the same technique as on the left side and was performed under fluoroscopic guidance as on the left side   A total of 10mg  of Kenalog was utilized for the procedure.   PLAN:  1. Medications: The patient will continue presently prescribed medications. Diclofenac and oxycodone 2. The patient will be considered for modification of treatment regimen pending response to the procedure performed on today's visit.  3. The patient is to follow-up with primary care physician [name] and [name] for evaluation of blood pressure and general medical condition following the procedure performed on today's visit.  4. Surgical evaluation as discussed.  5. Neurological evaluation as discussed.  6. Psych evaluation with Dr. Janeece RiggersSu as planned 7. The patient may be a candidate for radiofrequency procedures, implantation devices and other treatment pending response to treatment performed on today's visit and follow-up evaluation.  8. The patient has been advised to adhere to proper body mechanics and to avoid activities which may exacerbate the patient's symptoms.   Return appointment to [facility] Management Center as scheduled.    Review of Systems     Objective:   Physical Exam        Assessment & Plan:

## 2016-05-05 NOTE — Patient Instructions (Addendum)
PLAN   Continue present medication diclofenac and oxycodone and  Voltaren gel for the wrist and carpal tunnel syndrome symptoms. Also wear wrist splints for carpal tunnel syndrome symptoms as previously discussed . Diclofenac can cause bleeding. Do not take diclofenac if you have any bleeding of your GI tract  Block of nerves to the sacroiliac joint to be performed at time of return appointment  F/U PCP [name] for evaluation of blood pressure hemorrhoids and general medical condition as discussed. Also discuss diclofenac with [name]  Condition of  hemorrhoids. Do not take diclofenac if you have any GI bleeding  F/U surgical evaluation as discussed  F/U neurological evaluation. May consider pending follow-up evaluations  F/U psych evaluation as discussed  May consider radiofrequency rhizolysis or intraspinal procedures pending response to present treatment and F/U evaluation   Patient to call Pain Management Center should patient have concerns prior to scheduled return appointmentPain Management Discharge Instructions  General Discharge Instructions :  If you need to reach your doctor call: Monday-Friday 8:00 am - 4:00 pm at [phone] or toll free [phone].  After clinic hours [phone] to have operator reach doctor.  Bring all of your medication bottles to all your appointments in the pain clinic.  To cancel or reschedule your appointment with Pain Management please remember to call 24 hours in advance to avoid a fee.  Refer to the educational materials which you have been given on: General Risks, I had my Procedure. Discharge Instructions, Post Sedation.  Post Procedure Instructions:  The drugs you were given will stay in your system until tomorrow, so for the next 24 hours you should not drive, make any legal decisions or drink any alcoholic beverages.  You may eat anything you prefer, but it is better to start with liquids then soups and crackers, and gradually  work up to solid foods.  Please notify your doctor immediately if you have any unusual bleeding, trouble breathing or pain that is not related to your normal pain.  Depending on the type of procedure that was done, some parts of your body may feel week and/or numb.  This usually clears up by tonight or the next day.  Walk with the use of an assistive device or accompanied by an adult for the 24 hours.  You may use ice on the affected area for the first 24 hours.  Put ice in a Ziploc bag and cover with a towel and place against area 15 minutes on 15 minutes off.  You may switch to heat after 24 hours.

## 2016-05-05 NOTE — Progress Notes (Signed)
Patient here for procedure visit.  Patient has an upcoming pulmonologist appt for ongoing bronchitis.  Safety precautions to be maintained throughout the outpatient stay will include: orient to surroundings, keep bed in low position, maintain call bell within reach at all times, provide assistance with transfer out of bed and ambulation.

## 2016-05-06 ENCOUNTER — Telehealth: Payer: Self-pay | Admitting: *Deleted

## 2016-05-06 NOTE — Telephone Encounter (Signed)
No problems post procedure. 

## 2016-05-20 ENCOUNTER — Encounter: Payer: Self-pay | Admitting: Pain Medicine

## 2016-05-20 ENCOUNTER — Ambulatory Visit: Payer: Medicaid Other | Attending: Pain Medicine | Admitting: Pain Medicine

## 2016-05-20 VITALS — BP 109/73 | HR 97 | Temp 98.7°F | Resp 16 | Ht 62.0 in | Wt 201.0 lb

## 2016-05-20 DIAGNOSIS — M47816 Spondylosis without myelopathy or radiculopathy, lumbar region: Secondary | ICD-10-CM | POA: Insufficient documentation

## 2016-05-20 DIAGNOSIS — Z9889 Other specified postprocedural states: Secondary | ICD-10-CM | POA: Insufficient documentation

## 2016-05-20 DIAGNOSIS — M5481 Occipital neuralgia: Secondary | ICD-10-CM

## 2016-05-20 DIAGNOSIS — M533 Sacrococcygeal disorders, not elsewhere classified: Secondary | ICD-10-CM | POA: Diagnosis not present

## 2016-05-20 DIAGNOSIS — M170 Bilateral primary osteoarthritis of knee: Secondary | ICD-10-CM | POA: Diagnosis not present

## 2016-05-20 DIAGNOSIS — M961 Postlaminectomy syndrome, not elsewhere classified: Secondary | ICD-10-CM

## 2016-05-20 DIAGNOSIS — M5126 Other intervertebral disc displacement, lumbar region: Secondary | ICD-10-CM | POA: Diagnosis not present

## 2016-05-20 DIAGNOSIS — G5600 Carpal tunnel syndrome, unspecified upper limb: Secondary | ICD-10-CM | POA: Insufficient documentation

## 2016-05-20 DIAGNOSIS — M51369 Other intervertebral disc degeneration, lumbar region without mention of lumbar back pain or lower extremity pain: Secondary | ICD-10-CM

## 2016-05-20 DIAGNOSIS — G43909 Migraine, unspecified, not intractable, without status migrainosus: Secondary | ICD-10-CM | POA: Insufficient documentation

## 2016-05-20 DIAGNOSIS — M79604 Pain in right leg: Secondary | ICD-10-CM | POA: Diagnosis present

## 2016-05-20 DIAGNOSIS — M4806 Spinal stenosis, lumbar region: Secondary | ICD-10-CM | POA: Diagnosis not present

## 2016-05-20 DIAGNOSIS — M5030 Other cervical disc degeneration, unspecified cervical region: Secondary | ICD-10-CM

## 2016-05-20 DIAGNOSIS — M79605 Pain in left leg: Secondary | ICD-10-CM | POA: Diagnosis present

## 2016-05-20 DIAGNOSIS — M545 Low back pain: Secondary | ICD-10-CM | POA: Diagnosis present

## 2016-05-20 DIAGNOSIS — M5136 Other intervertebral disc degeneration, lumbar region: Secondary | ICD-10-CM | POA: Diagnosis not present

## 2016-05-20 MED ORDER — OXYCODONE HCL 10 MG PO TABS: ORAL_TABLET | ORAL | 0 refills | Status: DC
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
## 2016-05-30 LAB — TOXASSURE SELECT 13 (MW), URINE: PDF: 0

## 2016-05-30 NOTE — Progress Notes (Signed)
Reviewed

## 2016-06-24 ENCOUNTER — Ambulatory Visit: Payer: Medicaid Other | Attending: Pain Medicine | Admitting: Pain Medicine

## 2016-06-24 ENCOUNTER — Ambulatory Visit: Payer: Medicaid Other | Admitting: Pain Medicine

## 2016-06-24 ENCOUNTER — Encounter: Payer: Self-pay | Admitting: Pain Medicine

## 2016-06-24 VITALS — BP 124/70 | HR 111 | Temp 98.4°F | Ht 62.0 in | Wt 200.0 lb

## 2016-06-24 DIAGNOSIS — M170 Bilateral primary osteoarthritis of knee: Secondary | ICD-10-CM | POA: Insufficient documentation

## 2016-06-24 DIAGNOSIS — G5600 Carpal tunnel syndrome, unspecified upper limb: Secondary | ICD-10-CM | POA: Diagnosis not present

## 2016-06-24 DIAGNOSIS — M47818 Spondylosis without myelopathy or radiculopathy, sacral and sacrococcygeal region: Secondary | ICD-10-CM

## 2016-06-24 DIAGNOSIS — M62830 Muscle spasm of back: Secondary | ICD-10-CM | POA: Insufficient documentation

## 2016-06-24 DIAGNOSIS — G43909 Migraine, unspecified, not intractable, without status migrainosus: Secondary | ICD-10-CM | POA: Diagnosis not present

## 2016-06-24 DIAGNOSIS — M533 Sacrococcygeal disorders, not elsewhere classified: Secondary | ICD-10-CM

## 2016-06-24 DIAGNOSIS — Z9889 Other specified postprocedural states: Secondary | ICD-10-CM | POA: Diagnosis not present

## 2016-06-24 DIAGNOSIS — R51 Headache: Secondary | ICD-10-CM | POA: Diagnosis present

## 2016-06-24 DIAGNOSIS — M5136 Other intervertebral disc degeneration, lumbar region: Secondary | ICD-10-CM | POA: Diagnosis not present

## 2016-06-24 DIAGNOSIS — M47816 Spondylosis without myelopathy or radiculopathy, lumbar region: Secondary | ICD-10-CM | POA: Diagnosis not present

## 2016-06-24 DIAGNOSIS — M5481 Occipital neuralgia: Secondary | ICD-10-CM | POA: Diagnosis not present

## 2016-06-24 DIAGNOSIS — M5030 Other cervical disc degeneration, unspecified cervical region: Secondary | ICD-10-CM | POA: Diagnosis not present

## 2016-06-24 DIAGNOSIS — M461 Sacroiliitis, not elsewhere classified: Secondary | ICD-10-CM

## 2016-06-24 DIAGNOSIS — M542 Cervicalgia: Secondary | ICD-10-CM | POA: Diagnosis present

## 2016-06-24 MED ORDER — DICLOFENAC POTASSIUM 50 MG PO TABS: ORAL_TABLET | ORAL | 2 refills | Status: DC

## 2016-06-24 MED ORDER — ORPHENADRINE CITRATE ER 100 MG PO TB12: ORAL_TABLET | ORAL | 0 refills | Status: DC

## 2016-06-24 MED ORDER — OXYCODONE HCL 10 MG PO TABS: ORAL_TABLET | ORAL | 0 refills | Status: DC
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
## 2016-06-24 NOTE — Progress Notes (Signed)
Safety precautions to be maintained throughout the outpatient stay will include: orient to surroundings, keep bed in low position, maintain call bell within reach at all times, provide assistance with transfer out of bed and ambulation.  
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
## 2016-07-11 IMAGING — MR MR KNEE*R* W/O CM
5 series · 39 of 40 positions shown · non-contrast
Comparison: None.

CLINICAL DATA: Right knee pain.  Feels like it hyperextended.

EXAM:
MRI OF THE RIGHT KNEE WITHOUT CONTRAST
TECHNIQUE: Multiplanar, multisequence MR imaging of the knee was performed. No
intravenous contrast was administered.

[Series 3: PD fat-sat · axial · 3.0mm · 0.31mm/px · z∈[-43,+53]mm · 8 of 30 slices shown (1 of 3)]
[im 1/30]
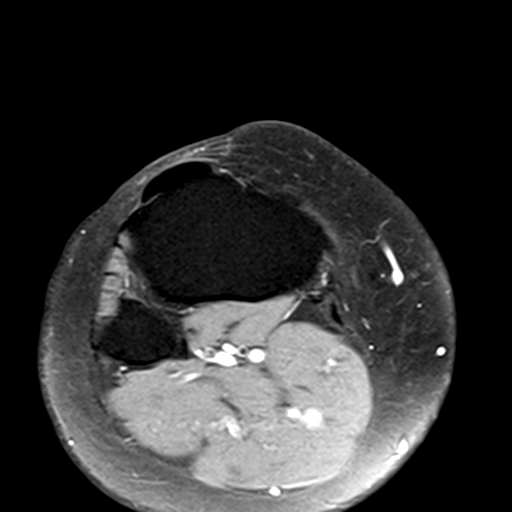
[im 5/30]
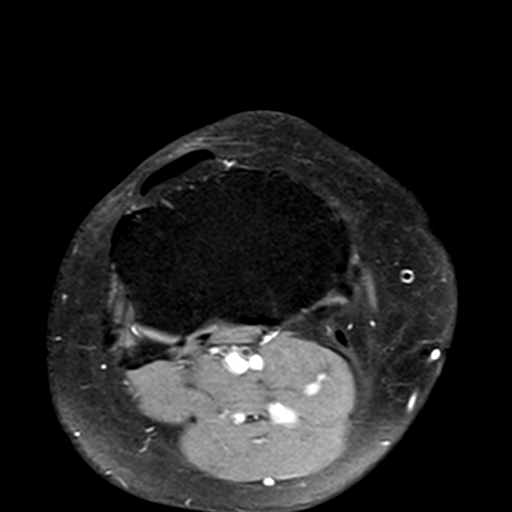
[im 9/30]
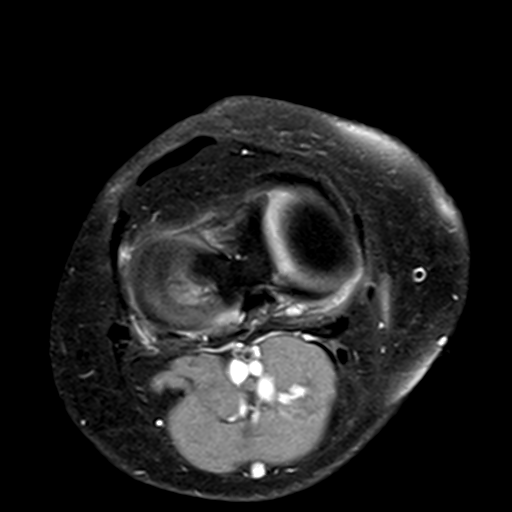
[im 13/30]
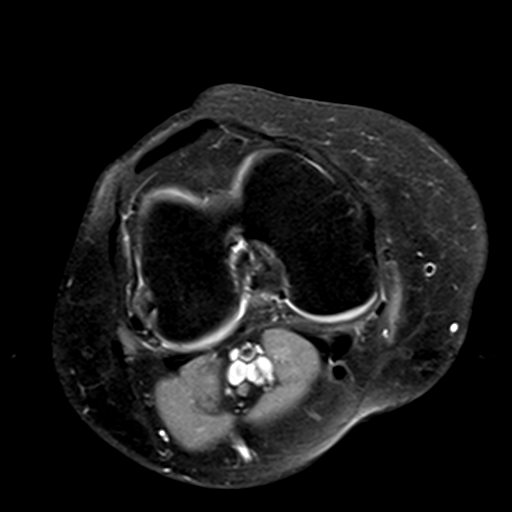
[im 17/30]
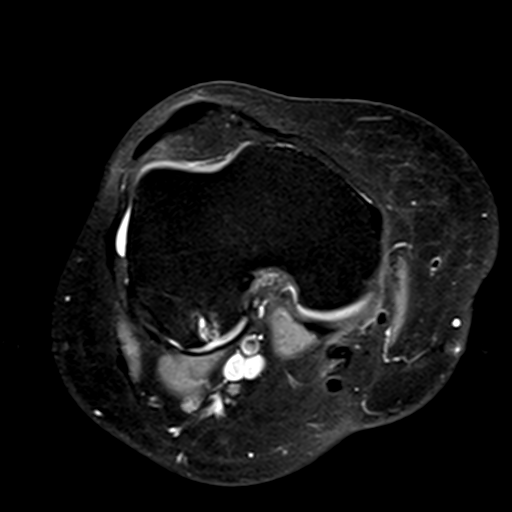
[im 21/30]
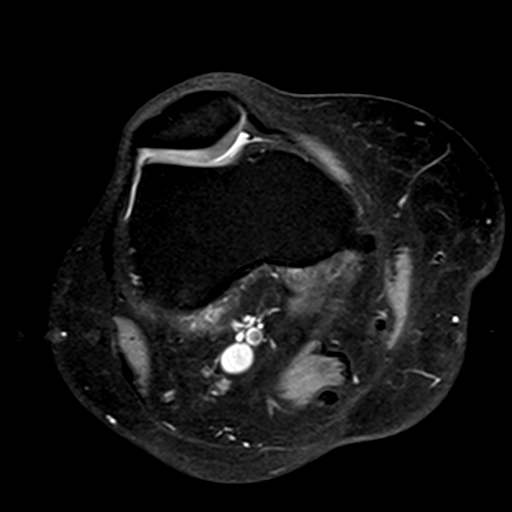
[im 25/30]
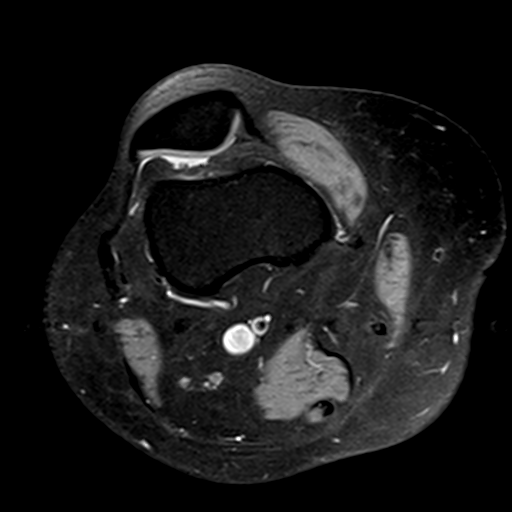
[im 30/30]
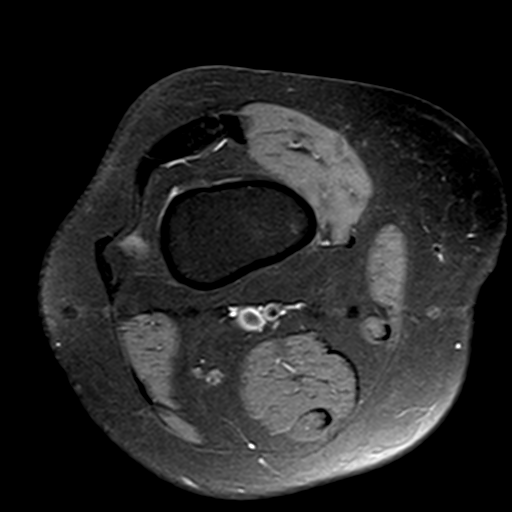

[Series 4: T1 · coronal · 3.0mm · 0.50mm/px · 7 of 29 slices shown]
[im 1/29]
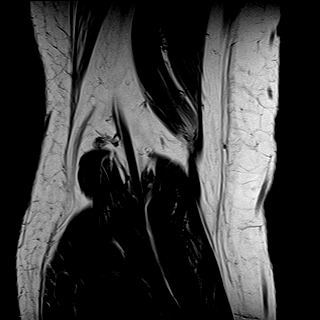
[im 5/29]
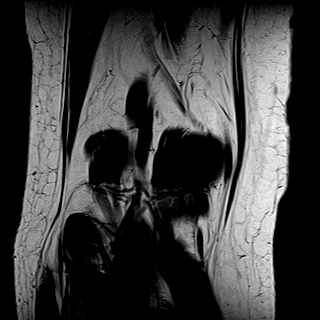
[im 9/29]
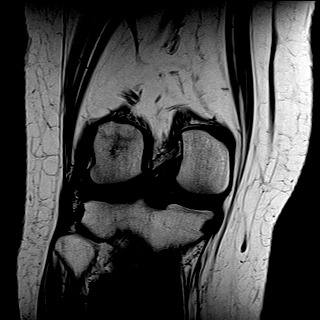
[im 13/29]
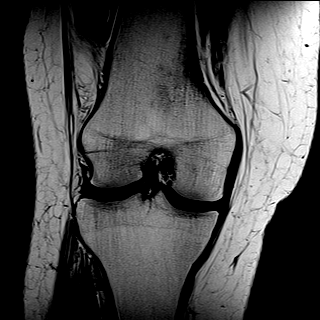
[im 17/29]
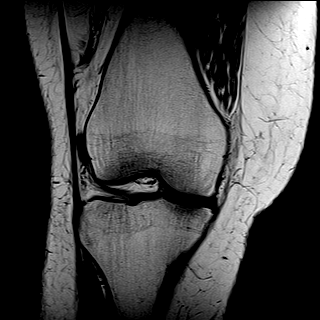
[im 21/29]
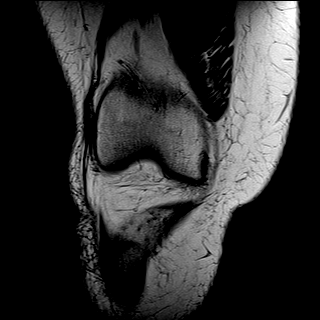
[im 25/29]
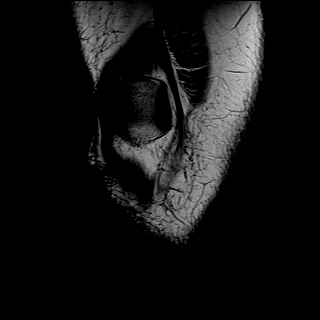

[Series 5: PD fat-sat · sagittal · 3.0mm · 0.62mm/px · 8 of 29 slices shown (2 of 3)]
[im 1/29]
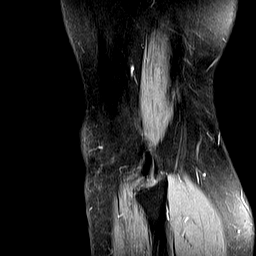
[im 5/29]
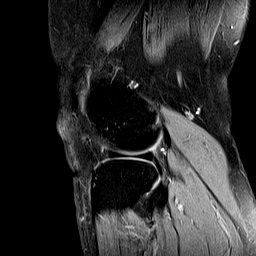
[im 9/29]
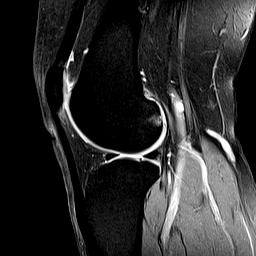
[im 13/29]
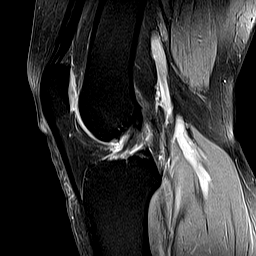
[im 17/29]
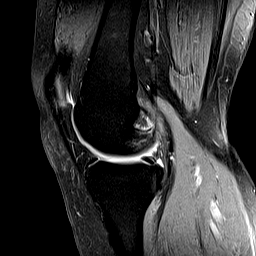
[im 21/29]
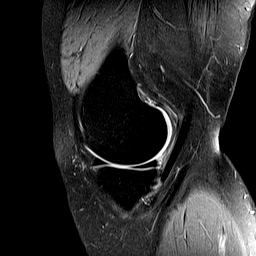
[im 25/29]
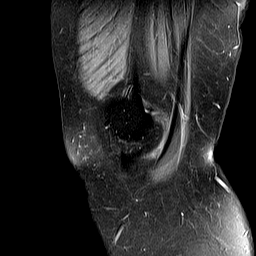
[im 29/29]
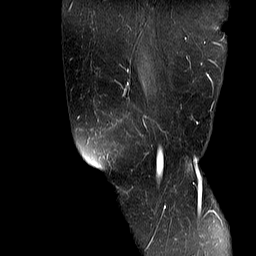

[Series 6: T2 fat-sat · coronal · 3.0mm · 0.50mm/px · 8 of 29 slices shown]
[im 1/29]
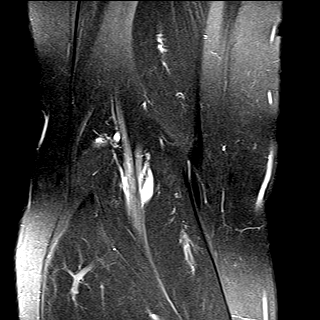
[im 5/29]
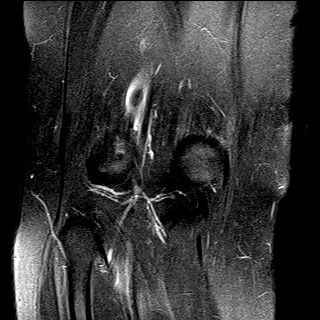
[im 9/29]
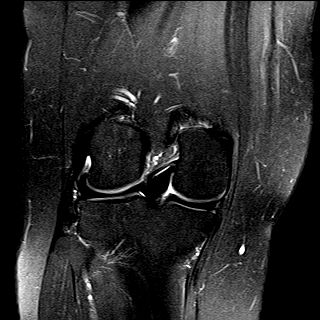
[im 13/29]
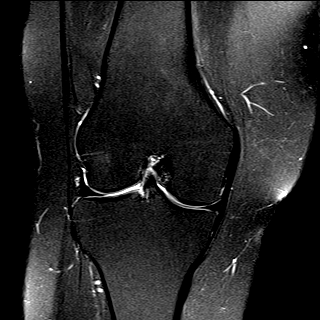
[im 17/29]
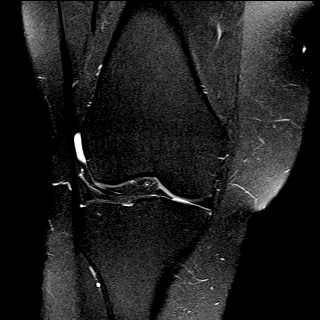
[im 21/29]
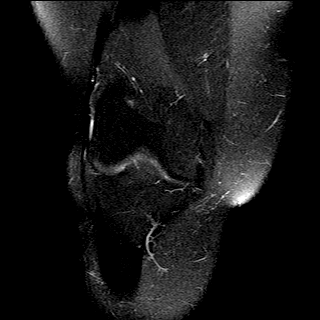
[im 25/29]
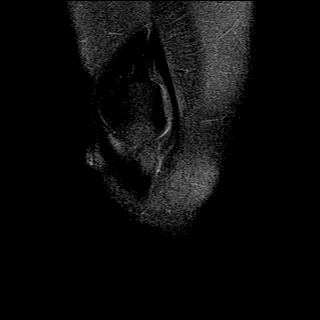
[im 29/29]
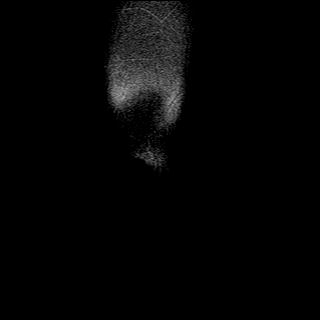

[Series 7: PD fat-sat · coronal · 3.0mm · 0.62mm/px · 8 of 29 slices shown (3 of 3)]
[im 1/29]
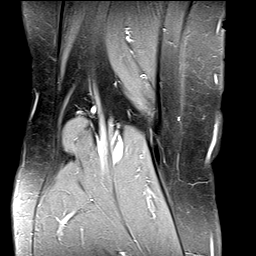
[im 5/29]
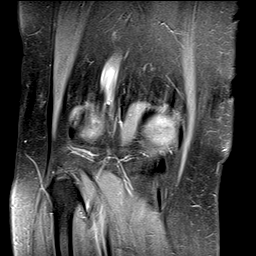
[im 9/29]
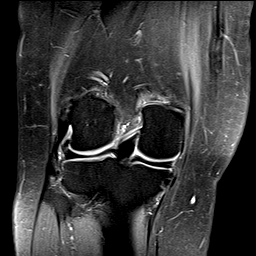
[im 13/29]
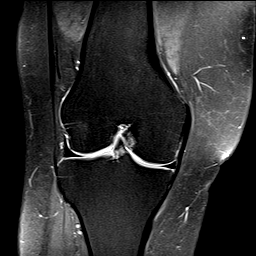
[im 17/29]
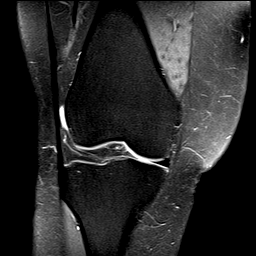
[im 21/29]
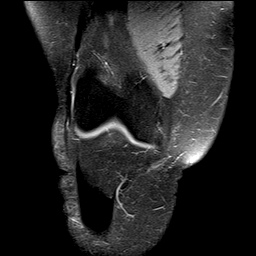
[im 25/29]
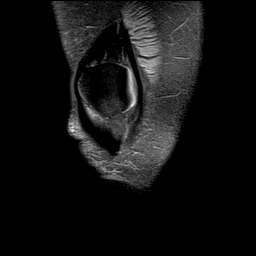
[im 29/29]
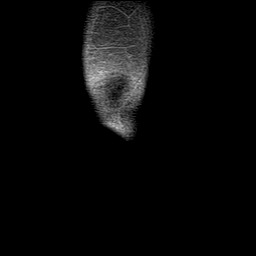

[39 of 40 positions shown; findings below may reference images not displayed]

FINDINGS: MENISCI

Medial meniscus:  Intact.

Lateral meniscus:  Intact.

LIGAMENTS

Cruciates:  Intact ACL and PCL.

Collaterals: Medial collateral ligament is intact. Lateral
collateral ligament complex is intact.

CARTILAGE

Patellofemoral:  Chondromalacia of the lateral patellar facet.

Medial:  No chondral defect.

Lateral: Small area full-thickness cartilage loss of the posterior
lateral femoral condyle with subchondral cystic changes involving
the nonweightbearing surface.

Joint: No joint effusion. Normal Hoffa's fat. No plical thickening.

Popliteal Fossa:  No Baker cyst.  Intact popliteus tendon.

Extensor Mechanism:  Intact.

Bones: No other marrow signal abnormality. No acute fracture or
dislocation.
IMPRESSION: 1. Small area full-thickness cartilage loss of the posterior lateral
femoral condyle with subchondral cystic changes involving the
nonweightbearing surface.

## 2016-08-06 ENCOUNTER — Encounter: Payer: Self-pay | Admitting: Emergency Medicine

## 2016-08-06 ENCOUNTER — Emergency Department
Admission: EM | Admit: 2016-08-06 | Discharge: 2016-08-06 | Disposition: A | Discharge status: Home / Self Care | Payer: Medicaid Other | Attending: Emergency Medicine | Admitting: Emergency Medicine

## 2016-08-06 DIAGNOSIS — E039 Hypothyroidism, unspecified: Secondary | ICD-10-CM | POA: Diagnosis not present

## 2016-08-06 DIAGNOSIS — F17210 Nicotine dependence, cigarettes, uncomplicated: Secondary | ICD-10-CM | POA: Insufficient documentation

## 2016-08-06 DIAGNOSIS — J45909 Unspecified asthma, uncomplicated: Secondary | ICD-10-CM | POA: Insufficient documentation

## 2016-08-06 DIAGNOSIS — R0789 Other chest pain: Secondary | ICD-10-CM | POA: Diagnosis present

## 2016-08-06 DIAGNOSIS — Z79899 Other long term (current) drug therapy: Secondary | ICD-10-CM | POA: Insufficient documentation

## 2016-08-06 LAB — CBC
HEMATOCRIT: 44.1 % (ref 35.0–47.0)
Hemoglobin: 15.3 g/dL (ref 12.0–16.0)
MCH: 30.6 pg (ref 26.0–34.0)
MCHC: 34.7 g/dL (ref 32.0–36.0)
MCV: 88 fL (ref 80.0–100.0)
PLATELETS: 317 10*3/uL (ref 150–440)
RBC: 5.01 MIL/uL (ref 3.80–5.20)
RDW: 13 % (ref 11.5–14.5)
WBC: 5.4 10*3/uL (ref 3.6–11.0)

## 2016-08-06 LAB — BASIC METABOLIC PANEL
ANION GAP: 7 (ref 5–15)
BUN: 6 mg/dL (ref 6–20)
CO2: 24 mmol/L (ref 22–32)
Calcium: 9.1 mg/dL (ref 8.9–10.3)
Chloride: 106 mmol/L (ref 101–111)
Creatinine, Ser: 0.66 mg/dL (ref 0.44–1.00)
GFR calc Af Amer: >60 mL/min (ref >60)
GLUCOSE: 125 mg/dL — AB (ref 65–99)
POTASSIUM: 3.2 mmol/L — AB (ref 3.5–5.1)
Sodium: 137 mmol/L (ref 135–145)

## 2016-08-06 LAB — TROPONIN I

## 2016-08-06 NOTE — ED Triage Notes (Signed)
Pt in with co left sided chest pain x 45 min that radiates to left shoulder. Denies any cardiac history, states does have some nausea, no shob.

## 2016-08-06 NOTE — ED Provider Notes (Signed)
[facility] Emergency Department Provider Note  ____________________________________________   First MD Initiated Contact with Patient 08/06/16 0255     (approximate)  I have reviewed the triage vital signs and the nursing notes.   HISTORY  Chief Complaint Chest Pain    HPI [name] is a 40 y.o. female who presents for evaluation of left-sided chest pain that moves up into her shoulder.  Moving her arm around makes it worse as well as pressing on her chest.  She denies any shortness of breath, fever/chills, abdominal pain.  She has had some nausea but no vomiting.  She has never had similar pain in the past.  She did state that she has been under a lot of stress recently and she felt like she was panicking when her chest and arm started to hurt but she feels much better at this time.  She has an allergy to aspirin so will nottake a dose here.  She reports that the symptoms got better on their own.  She has no history of hypertension, diabetes, hyperlipidemia.  She does smoke tobacco.   Past Medical History:  Diagnosis Date  . Asthma   . Bleeding hemorrhoid   . GERD (gastroesophageal reflux disease)   . Hypothyroidism   . Seizures [facility])     Patient Active Problem List   Diagnosis Date Noted  . Sacroiliac joint dysfunction 06/03/2015  . DJD (degenerative joint disease) of knee 03/28/2015  . DDD (degenerative disc disease), lumbar 03/06/2015  . Lumbar post-laminectomy syndrome 03/06/2015  . DDD (degenerative disc disease), cervical 03/06/2015  . Degenerative joint disease of sacroiliac joint 03/06/2015  . Bilateral occipital neuralgia 03/06/2015  . Adnexal pain 07/13/2013  . Female genuine stress incontinence 07/13/2013  . Urge incontinence 07/13/2013  . FOM (frequency of micturition) 07/13/2013    Past Surgical History:  Procedure Laterality Date  . CESAREAN SECTION  2003   with tubal ligation   . CHOLESTEATOMA EXCISION  2010   to  remove a benign tumor behind R ear drum   . removal of bilateral fallopian tubes Bilateral 2012  . removal of L ovary Left 2012   due a cyst and scar tissue  . SPINE SURGERY  2012   2 surgeries: to remove a herniated disc and to remove a pus pocket at incision site   . TUBAL LIGATION    . VAGINAL HYSTERECTOMY  2007     due to fibroid cysts    Prior to Admission medications   Medication Sig Start Date End Date Taking? Authorizing Provider  alprazolam Prudy Feeler) 2 MG tablet Take 2 mg by mouth 4 (four) times daily as needed for sleep.    Historical Provider, MD  amphetamine-dextroamphetamine (ADDERALL) 30 MG tablet Take 30 mg by mouth 2 (two) times daily.    Historical Provider, MD  cetirizine (ZYRTEC) 10 MG tablet Take by mouth. Reported on 05/05/2016    Historical Provider, MD  conjugated estrogens (PREMARIN) vaginal cream Place 1 Applicatorful vaginally daily.    Historical Provider, MD  diclofenac (CATAFLAM) 50 MG tablet 1 tab by mouth per day or twice a day if tolerated 06/24/16   Ewing Schlein, MD  diclofenac (CATAFLAM) 50 MG tablet 1 tab by mouth per day or twice a day if tolerated 06/24/16   Ewing Schlein, MD  diclofenac sodium (VOLTAREN) 1 % GEL Apply 2-4 g to painful area of skin 4 times per day if tolerated. Apply only a maximum of 4 g per application  04/20/16   Ewing SchleinGregory Crisp, MD  fluticasone (FLOVENT HFA) 220 MCG/ACT inhaler Inhale 2 puffs into the lungs every 4 (four) hours as needed.    Historical Provider, MD  levocetirizine (XYZAL) 5 MG tablet Take 5 mg by mouth every evening.    Historical Provider, MD  levocetirizine (XYZAL) 5 MG tablet Take 5 mg by mouth every evening.    Historical Provider, MD  levothyroxine (SYNTHROID, LEVOTHROID) 150 MCG tablet Take 150 mcg by mouth. Reported on 05/05/2016    Historical Provider, MD  levothyroxine (SYNTHROID, LEVOTHROID) 175 MCG tablet Take 175 mcg by mouth. 6 days a week    Historical Provider, MD  loratadine (CLARITIN) 10 MG tablet Take 10 mg  by mouth daily. Reported on 05/05/2016    Historical Provider, MD  mirabegron ER (MYRBETRIQ) 50 MG TB24 tablet Take 1 tablet (50 mg total) by mouth daily. 11/18/15   Vanna ScotlandAshley Brandon, MD  montelukast (SINGULAIR) 10 MG tablet Take 10 mg by mouth at bedtime.    Historical Provider, MD  omeprazole (PRILOSEC) 40 MG capsule Take 40 mg by mouth daily.    Historical Provider, MD  orphenadrine (NORFLEX) 100 MG tablet Limit 1 tablet by mouth per day or twice per day if tolerated  NO ROBAXIN (METHOCARBAMOL) 06/24/16   Ewing SchleinGregory Crisp, MD  Oxycodone HCl 10 MG TABS 1-2 tablets by mouth twice a day to 3 times a day if tolerated 06/24/16   Ewing SchleinGregory Crisp, MD  pramoxine (PROCTOFOAM) 1 % foam Place 1 application rectally 3 (three) times daily as needed for itching. 04/08/16   Myrna Blazeravid Matthew Schaevitz, MD  traMADol Janean Sark(ULTRAM) 50 MG tablet Take by mouth. Reported on 05/05/2016 12/30/14   Historical Provider, MD    Allergies Celecoxib; Darvon [propoxyphene]; and Gabapentin  Family History  Problem Relation Age of Onset  . Alcohol abuse Mother   . Arthritis Mother   . Depression Mother   . Hearing loss Mother   . Alcohol abuse Father   . Arthritis Father   . COPD Father   . Depression Father   . Hearing loss Father   . Hyperlipidemia Father   . Hypertension Father     Social History Social History  Substance Use Topics  . Smoking status: Current Every Day Smoker    Packs/day: 1.00    Types: Cigarettes  . Smokeless tobacco: Never Used  . Alcohol use No     Comment: occasionally    Review of Systems Constitutional: No fever/chills Eyes: No visual changes. ENT: No sore throat. Cardiovascular: +chest pain. Respiratory: Denies shortness of breath. Gastrointestinal: No abdominal pain.  nausea, no vomiting.  No diarrhea.  No constipation. Genitourinary: Negative for dysuria. Musculoskeletal: Negative for back pain. Skin: Negative for rash. Neurological: Negative for headaches, focal weakness or  numbness.  10-point ROS otherwise negative.  ____________________________________________   PHYSICAL EXAM:  VITAL SIGNS: ED Triage Vitals  Enc Vitals Group     BP 08/06/16 0137 (!) 128/94     Pulse Rate 08/06/16 0137 (!) 112     Resp 08/06/16 0137 18     Temp 08/06/16 0137 97.9 F (36.6 C)     Temp Source 08/06/16 0137 Oral     SpO2 08/06/16 0137 97 %     Weight 08/06/16 0133 185 lb (83.9 kg)     Height 08/06/16 0133 5\' 2"  (1.575 m)     Head Circumference --      Peak Flow --      Pain Score 08/06/16 0134 9  Pain Loc --      Pain Edu? --      Excl. in GC? --     Constitutional: Alert and oriented. Well appearing and in no acute distress. Eyes: Conjunctivae are normal. PERRL. EOMI. Head: Atraumatic. Nose: No congestion/rhinnorhea. Mouth/Throat: Mucous membranes are moist.  Oropharynx non-erythematous. Neck: No stridor.  No meningeal signs.   Cardiovascular: Normal rate, regular rhythm. Good peripheral circulation. Grossly normal heart sounds. Respiratory: Normal respiratory effort.  No retractions. Lungs CTAB. Highly reproducible chest wall pain on the left anterior chest Gastrointestinal: Soft and nontender. No distention.  Musculoskeletal: No lower extremity tenderness nor edema. No gross deformities of extremities. Neurologic:  Normal speech and language. No gross focal neurologic deficits are appreciated.  Skin:  Skin is warm, dry and intact. No rash noted. Psychiatric: Mood and affect are normal. Speech and behavior are normal.  ____________________________________________   LABS (all labs ordered are listed, but only abnormal results are displayed)  Labs Reviewed  BASIC METABOLIC PANEL - Abnormal; Notable for the following:       Result Value   Potassium 3.2 (*)    Glucose, Bld 125 (*)    All other components within normal limits  CBC  TROPONIN I   ____________________________________________  EKG  ED ECG REPORT I, Odetta Forness, the attending  physician, personally viewed and interpreted this ECG.  Date: 08/06/2016 EKG Time: 01:37 Rate: 121 Rhythm: Sinus tachycardia QRS Axis: normal Intervals: normal ST/T Wave abnormalities: Very minimal ST depression in lead 2 and aVF.  Inverted T-wave in lead V3.  Morphology is essentially unchanged from 2011 Conduction Disturbances: none Narrative Interpretation: Questionable acute ischemia but no STEMI criteria  ____________________________________________  RADIOLOGY   No results found.  ____________________________________________   PROCEDURES  Procedure(s) performed:   Procedures   Critical Care performed: No ____________________________________________   INITIAL IMPRESSION / ASSESSMENT AND PLAN / ED COURSE  Pertinent labs & imaging results that were available during my care of the patient were reviewed by me and considered in my medical decision making (see chart for details).  The patient is low risk for any cardiac issues and PERC negative.  She was initially tachycardic when she arrived that completely resolved and she stated that she was having a panic attack when she arrived but she feels much better.  I offered a second troponin but she declines at this time and states she would rather just go home.  Given how low risk she has for any acute or emergent medical condition I think this is appropriate.  She has had a reproducible chest wall pain and I discussed appropriate treatment of musculoskeletal chest wall pain with her.  She understands and agrees with the plan for outpatient follow-up.   Clinical Course  Comment By Time  Patient left without receiving D/C papers. Loleta Rose, MD 10/13 [phone]    ____________________________________________  FINAL CLINICAL IMPRESSION(S) / ED DIAGNOSES  Final diagnoses:  Chest wall pain     MEDICATIONS GIVEN DURING THIS VISIT:  Medications - No data to display   NEW OUTPATIENT MEDICATIONS STARTED DURING THIS  VISIT:  Discharge Medication List as of 08/06/2016  3:56 AM      Discharge Medication List as of 08/06/2016  3:56 AM      Discharge Medication List as of 08/06/2016  3:56 AM       Note:  This document was prepared using Dragon voice recognition software and may include unintentional dictation errors.    Loleta Rose, MD 08/06/16  0517  

## 2016-08-06 NOTE — ED Notes (Signed)
Pt left after final update on treatment plan given by Dr York CeriseForbach without waiting for d/c papers and follow-up instructions. Unable to re-assess pt's  pain level or obtain e-signature at d/c. Pt's d/c paperwork placed in brown envelope and given to registration in the lobby in case the pt decides to return.

## 2016-08-06 NOTE — Discharge Instructions (Signed)

## 2016-08-24 DIAGNOSIS — I208 Other forms of angina pectoris: Secondary | ICD-10-CM | POA: Insufficient documentation

## 2016-11-18 ENCOUNTER — Other Ambulatory Visit: Payer: Self-pay

## 2016-11-18 DIAGNOSIS — N3281 Overactive bladder: Secondary | ICD-10-CM

## 2016-11-18 MED ORDER — MIRABEGRON ER 50 MG PO TB24: 50.0000 mg | ORAL_TABLET | Freq: Every day | ORAL | 11 refills | Status: DC

## 2016-11-19 ENCOUNTER — Ambulatory Visit: Payer: Medicaid Other | Admitting: Urology

## 2016-11-19 ENCOUNTER — Ambulatory Visit (INDEPENDENT_AMBULATORY_CARE_PROVIDER_SITE_OTHER): Payer: Medicaid Other | Admitting: Urology

## 2016-11-19 ENCOUNTER — Encounter: Payer: Self-pay | Admitting: Urology

## 2016-11-19 VITALS — BP 121/76 | HR 86 | Ht 62.0 in | Wt 180.0 lb

## 2016-11-19 DIAGNOSIS — R3129 Other microscopic hematuria: Secondary | ICD-10-CM

## 2016-11-19 DIAGNOSIS — N393 Stress incontinence (female) (male): Secondary | ICD-10-CM

## 2016-11-19 DIAGNOSIS — N3281 Overactive bladder: Secondary | ICD-10-CM

## 2016-11-19 DIAGNOSIS — N3941 Urge incontinence: Secondary | ICD-10-CM

## 2016-11-19 DIAGNOSIS — R102 Pelvic and perineal pain: Secondary | ICD-10-CM | POA: Insufficient documentation

## 2016-11-19 LAB — URINALYSIS, COMPLETE
BILIRUBIN UA: NEGATIVE
Glucose, UA: NEGATIVE
NITRITE UA: NEGATIVE
PH UA: 5.5 (ref 5.0–7.5)
Specific Gravity, UA: >1.03 — ABNORMAL HIGH (ref 1.005–1.030)
Urobilinogen, Ur: 0.2 mg/dL (ref 0.2–1.0)

## 2016-11-19 LAB — MICROSCOPIC EXAMINATION

## 2016-11-19 LAB — BLADDER SCAN AMB NON-IMAGING: Scan Result: 27

## 2016-11-19 MED ORDER — MIRABEGRON ER 50 MG PO TB24: 50.0000 mg | ORAL_TABLET | Freq: Every day | ORAL | 3 refills | Status: DC

## 2016-11-19 NOTE — Progress Notes (Signed)
11/19/2016 9:15 AM   [name] 17-Jun-1976 [id number]  Referring provider: Hillery Aldo, MD [address] [place], [place] [address]  CC: Follow up mixed incontinence, hematuria, pelvic pain with PVR  HPI:  1 - Urge Predominant Mixed Incontinence - on Myrbetriq 50mg  QD for urge predom mixed incontinence with good control. Prior anticholinergics did not meet goals. Stress leakage is minimal / non-bothersome.   2 - Microscopic Hematuria - Negative eval 2017 with CT and Cysto. No gross episodes.  3 - Chronic Pelvic Pain - long h/o chronic pain (chest pain, back pain, pelvic pain) of non-organic etiology. She has had hysterectomy for this indication as well.  Pelvic exam, PVR, imaging unremarkable x several. Manages with PRN vaginal valium.   Today "[name]" is seen in f/u above and PVR which remains excellent (<46mL).    PMH: Past Medical History:  Diagnosis Date  . Asthma   . Bleeding hemorrhoid   . GERD (gastroesophageal reflux disease)   . Hypothyroidism   . Seizures [facility])     Surgical History: Past Surgical History:  Procedure Laterality Date  . CESAREAN SECTION  2003   with tubal ligation   . CHOLESTEATOMA EXCISION  2010   to remove a benign tumor behind R ear drum   . removal of bilateral fallopian tubes Bilateral 2012  . removal of L ovary Left 2012   due a cyst and scar tissue  . SPINE SURGERY  2012   2 surgeries: to remove a herniated disc and to remove a pus pocket at incision site   . TUBAL LIGATION    . VAGINAL HYSTERECTOMY  2007     due to fibroid cysts    Home Medications:  Allergies as of 11/19/2016      Reactions   Celecoxib Other (See Comments)   Darvon [propoxyphene] Hypertension   Other reaction(s): Unknown Heart race   Gabapentin Rash      Medication List       Accurate as of 11/19/16  9:15 AM. Always use your most recent med list.          alprazolam 2 MG tablet Commonly known as:  XANAX Take 2 mg by mouth 4 (four)  times daily as needed for sleep.   amphetamine-dextroamphetamine 30 MG tablet Commonly known as:  ADDERALL Take 30 mg by mouth 2 (two) times daily.   cetirizine 10 MG tablet Commonly known as:  ZYRTEC Take by mouth. Reported on 05/05/2016   conjugated estrogens vaginal cream Commonly known as:  PREMARIN Place 1 Applicatorful vaginally daily.   diclofenac 50 MG tablet Commonly known as:  CATAFLAM 1 tab by mouth per day or twice a day if tolerated   diclofenac 50 MG tablet Commonly known as:  CATAFLAM 1 tab by mouth per day or twice a day if tolerated   diclofenac sodium 1 % Gel Commonly known as:  VOLTAREN Apply 2-4 g to painful area of skin 4 times per day if tolerated. Apply only a maximum of 4 g per application   fluticasone 220 MCG/ACT inhaler Commonly known as:  FLOVENT HFA Inhale 2 puffs into the lungs every 4 (four) hours as needed.   levocetirizine 5 MG tablet Commonly known as:  XYZAL Take 5 mg by mouth every evening.   levocetirizine 5 MG tablet Commonly known as:  XYZAL Take 5 mg by mouth every evening.   levothyroxine 150 MCG tablet Commonly known as:  SYNTHROID, LEVOTHROID Take 150 mcg by mouth. Reported  on 05/05/2016   levothyroxine 175 MCG tablet Commonly known as:  SYNTHROID, LEVOTHROID Take 175 mcg by mouth. 6 days a week   loratadine 10 MG tablet Commonly known as:  CLARITIN Take 10 mg by mouth daily. Reported on 05/05/2016   mirabegron ER 50 MG Tb24 tablet Commonly known as:  MYRBETRIQ Take 1 tablet (50 mg total) by mouth daily.   montelukast 10 MG tablet Commonly known as:  SINGULAIR Take 10 mg by mouth at bedtime.   omeprazole 40 MG capsule Commonly known as:  PRILOSEC Take 40 mg by mouth daily.   orphenadrine 100 MG tablet Commonly known as:  NORFLEX Limit 1 tablet by mouth per day or twice per day if tolerated  NO ROBAXIN (METHOCARBAMOL)   Oxycodone HCl 10 MG Tabs 1-2 tablets by mouth twice a day to 3 times a day if tolerated     pramoxine 1 % foam Commonly known as:  PROCTOFOAM Place 1 application rectally 3 (three) times daily as needed for itching.   traMADol 50 MG tablet Commonly known as:  ULTRAM Take by mouth. Reported on 05/05/2016       Allergies:  Allergies  Allergen Reactions  . Celecoxib Other (See Comments)  . Darvon [Propoxyphene] Hypertension    Other reaction(s): Unknown Heart race  . Gabapentin Rash    Family History: Family History  Problem Relation Age of Onset  . Alcohol abuse Mother   . Arthritis Mother   . Depression Mother   . Hearing loss Mother   . Alcohol abuse Father   . Arthritis Father   . COPD Father   . Depression Father   . Hearing loss Father   . Hyperlipidemia Father   . Hypertension Father     Social History:  reports that she has been smoking Cigarettes.  She has been smoking about 1.00 pack per day. She has never used smokeless tobacco. She reports that she does not drink alcohol or use drugs.    Review of Systems  Gastrointestinal (upper)  : Negative for upper GI symptoms  Gastrointestinal (lower) : Negative for lower GI symptoms  Constitutional : Negative for symptoms  Skin: Negative for skin symptoms  Eyes: Negative for eye symptoms  Ear/Nose/Throat : Negative for Ear/Nose/Throat symptoms  Hematologic/Lymphatic: Negative for Hematologic/Lymphatic symptoms  Cardiovascular : Negative for cardiovascular symptoms  Respiratory : Negative for respiratory symptoms  Endocrine: Negative for endocrine symptoms  Musculoskeletal: Negative for musculoskeletal symptoms  Neurological: Negative for neurological symptoms  Psychologic: Negative for psychiatric symptoms   Physical Exam: There were no vitals taken for this visit.  Constitutional:  Alert and oriented, No acute distress. HEENT: [place] AT, moist mucus membranes.  Trachea midline, no masses. Cardiovascular: No clubbing, cyanosis, or edema. Respiratory: Normal respiratory  effort, no increased work of breathing. GI: Abdomen is soft, nontender, nondistended, no abdominal masses GU: No CVA tenderness. Miild low SP TTP.  Skin: No rashes, bruises or suspicious lesions. Lymph: No cervical or inguinal adenopathy. Neurologic: Grossly intact, no focal deficits, moving all 4 extremities. Psychiatric: Normal mood and affect. Some L spine TTP that is stable per pt.   Laboratory Data: Lab Results  Component Value Date   WBC 5.4 08/06/2016   HGB 15.3 08/06/2016   HCT 44.1 08/06/2016   MCV 88.0 08/06/2016   PLT 317 08/06/2016    Lab Results  Component Value Date   CREATININE 0.66 08/06/2016    No results found for: PSA  No results found for: TESTOSTERONE  No  results found for: HGBA1C  Urinalysis    Component Value Date/Time   COLORURINE YELLOW (A) 04/08/2016 0055   APPEARANCEUR CLEAR (A) 04/08/2016 0055   APPEARANCEUR Clear 11/18/2015 1359   LABSPEC 1.027 04/08/2016 0055   LABSPEC 1.018 10/17/2013 2032   PHURINE 5.0 04/08/2016 0055   GLUCOSEU NEGATIVE 04/08/2016 0055   GLUCOSEU Negative 10/17/2013 2032   HGBUR 2+ (A) 04/08/2016 0055   BILIRUBINUR NEGATIVE 04/08/2016 0055   BILIRUBINUR Negative 11/18/2015 1359   BILIRUBINUR Negative 10/17/2013 2032   KETONESUR NEGATIVE 04/08/2016 0055   PROTEINUR NEGATIVE 04/08/2016 0055   UROBILINOGEN 0.2 12/29/2014 1738   NITRITE NEGATIVE 04/08/2016 0055   LEUKOCYTESUR NEGATIVE 04/08/2016 0055   LEUKOCYTESUR Negative 11/18/2015 1359   LEUKOCYTESUR 2+ 10/17/2013 2032    Pertinent Imaging: All studies referenced in HPI indepentandly reveiwed.   Assessment & Plan:    1 - Urge Predominant Mixed Incontinence - continue Myrbetriq for urge component. Would consider BID Cymbalta 20 for stress component (may also help with chronic pain) should that become more botherseome in future..   2 - Microscopic Hematuria - Reinforced need to repeat eval for future gross / visible episodes only. She voiced understanding.    3 - Chronic Pelvic Pain - this is clearly another manifestation of underlying chronic pain syndrome. Not indication for narcotics. Continue vag valium PRN, again consider BID cymbalta dual indication as per above in future.  RTC 1 year with PVR / med refills.   Sebastian Ache, MD  [facility] [address] [place], [place] [address] [phone]

## 2017-02-10 ENCOUNTER — Encounter: Payer: Self-pay | Admitting: Emergency Medicine

## 2017-02-10 ENCOUNTER — Emergency Department
Admission: EM | Admit: 2017-02-10 | Discharge: 2017-02-11 | Disposition: A | Discharge status: Other Acute Inpatient Hospital | Payer: Medicaid Other | Attending: Emergency Medicine | Admitting: Emergency Medicine

## 2017-02-10 DIAGNOSIS — J45909 Unspecified asthma, uncomplicated: Secondary | ICD-10-CM | POA: Diagnosis not present

## 2017-02-10 DIAGNOSIS — Z79899 Other long term (current) drug therapy: Secondary | ICD-10-CM | POA: Insufficient documentation

## 2017-02-10 DIAGNOSIS — F17210 Nicotine dependence, cigarettes, uncomplicated: Secondary | ICD-10-CM | POA: Diagnosis not present

## 2017-02-10 DIAGNOSIS — R45851 Suicidal ideations: Secondary | ICD-10-CM

## 2017-02-10 DIAGNOSIS — F329 Major depressive disorder, single episode, unspecified: Secondary | ICD-10-CM | POA: Insufficient documentation

## 2017-02-10 DIAGNOSIS — F4325 Adjustment disorder with mixed disturbance of emotions and conduct: Secondary | ICD-10-CM

## 2017-02-10 DIAGNOSIS — E039 Hypothyroidism, unspecified: Secondary | ICD-10-CM | POA: Diagnosis not present

## 2017-02-10 DIAGNOSIS — F332 Major depressive disorder, recurrent severe without psychotic features: Secondary | ICD-10-CM

## 2017-02-10 DIAGNOSIS — R45850 Homicidal ideations: Secondary | ICD-10-CM

## 2017-02-10 LAB — CBC
HEMATOCRIT: 40.1 % (ref 35.0–47.0)
Hemoglobin: 13.5 g/dL (ref 12.0–16.0)
MCH: 29.4 pg (ref 26.0–34.0)
MCHC: 33.7 g/dL (ref 32.0–36.0)
MCV: 87.4 fL (ref 80.0–100.0)
Platelets: 369 10*3/uL (ref 150–440)
RBC: 4.58 MIL/uL (ref 3.80–5.20)
RDW: 13.9 % (ref 11.5–14.5)
WBC: 6.3 10*3/uL (ref 3.6–11.0)

## 2017-02-10 LAB — COMPREHENSIVE METABOLIC PANEL
ALBUMIN: 3.7 g/dL (ref 3.5–5.0)
ALT: 10 U/L — ABNORMAL LOW (ref 14–54)
ANION GAP: 7 (ref 5–15)
AST: 15 U/L (ref 15–41)
Alkaline Phosphatase: 58 U/L (ref 38–126)
BILIRUBIN TOTAL: 0.4 mg/dL (ref 0.3–1.2)
BUN: 5 mg/dL — ABNORMAL LOW (ref 6–20)
CO2: 25 mmol/L (ref 22–32)
Calcium: 9 mg/dL (ref 8.9–10.3)
Chloride: 106 mmol/L (ref 101–111)
Creatinine, Ser: 0.52 mg/dL (ref 0.44–1.00)
GFR calc Af Amer: >60 mL/min (ref >60)
GFR calc non Af Amer: >60 mL/min (ref >60)
GLUCOSE: 107 mg/dL — AB (ref 65–99)
POTASSIUM: 3 mmol/L — AB (ref 3.5–5.1)
Sodium: 138 mmol/L (ref 135–145)
TOTAL PROTEIN: 7.1 g/dL (ref 6.5–8.1)

## 2017-02-10 LAB — ACETAMINOPHEN LEVEL

## 2017-02-10 LAB — SALICYLATE LEVEL

## 2017-02-10 LAB — URINE DRUG SCREEN, QUALITATIVE (ARMC ONLY)
AMPHETAMINES, UR SCREEN: POSITIVE — AB
BENZODIAZEPINE, UR SCRN: NOT DETECTED
Barbiturates, Ur Screen: NOT DETECTED
Cannabinoid 50 Ng, Ur ~~LOC~~: NOT DETECTED
Cocaine Metabolite,Ur ~~LOC~~: NOT DETECTED
MDMA (Ecstasy)Ur Screen: NOT DETECTED
METHADONE SCREEN, URINE: NOT DETECTED
Opiate, Ur Screen: NOT DETECTED
Phencyclidine (PCP) Ur S: NOT DETECTED
TRICYCLIC, UR SCREEN: NOT DETECTED

## 2017-02-10 LAB — ETHANOL

## 2017-02-10 NOTE — BH Assessment (Signed)
Assessment Note  [name] is an 41 y.o. female, with a history of depression, anxiety and ADHD, presenting to the ED via [facility].  Pt was brought in to the ED under IVC for suicidal and homicidal ideations.  According to the IVC, patient threatened to kill herself and her children.  Pt denies the allegations while in the ED.  She says that she loves her children and would never do anything to harm them.  She also states that she would never subject her children to the trauma of loosing a parent to suicide.  Pt states that she was upset because she found out her husband was cheating on her.  She says she may have said some "stupid things" but adamantly denied wanting to harm herself or her children.  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  She denies any current drug/alcohol use.  She denies auditory/visual hallucinations.  Diagnosis: Depression  Past Medical History:  Past Medical History:  Diagnosis Date  . Asthma   . Bleeding hemorrhoid   . GERD (gastroesophageal reflux disease)   . Hypothyroidism   . Seizures (HCC)     Past Surgical History:  Procedure Laterality Date  . CESAREAN SECTION  2003   with tubal ligation   . CHOLESTEATOMA EXCISION  2010   to remove a benign tumor behind R ear drum   . removal of bilateral fallopian tubes Bilateral 2012  . removal of L ovary Left 2012   due a cyst and scar tissue  . SPINE SURGERY  2012   2 surgeries: to remove a herniated disc and to remove a pus pocket at incision site   . TUBAL LIGATION    . VAGINAL HYSTERECTOMY  2007     due to fibroid cysts    Family History:  Family History  Problem Relation Age of Onset  . Alcohol abuse Mother   . Arthritis Mother   . Depression Mother   . Hearing loss Mother   . Alcohol abuse Father   . Arthritis Father   . COPD Father   . Depression Father   . Hearing loss Father   . Hyperlipidemia Father    . Hypertension Father     Social History:  reports that she has been smoking Cigarettes.  She has been smoking about 1.00 pack per day. She has never used smokeless tobacco. She reports that she does not drink alcohol or use drugs.  Additional Social History:  Alcohol / Drug Use Pain Medications: see PTA Prescriptions: See PTA Over the Counter: See PTA History of alcohol / drug use?: No history of alcohol / drug abuse  CIWA: CIWA-Ar BP: 138/81 Pulse Rate: 79 COWS:    Allergies:  Allergies  Allergen Reactions  . Celecoxib Other (See Comments)  . Darvon [Propoxyphene] Hypertension    Other reaction(s): Unknown Heart race  . Gabapentin Rash    Home Medications:  (Not in a hospital admission)  OB/GYN Status:  No LMP recorded. Patient has had a hysterectomy.  General Assessment Data Location of Assessment: [facility] ED TTS Assessment: In system Is this a Tele or Face-to-Face Assessment?: Face-to-Face Is this an Initial Assessment or a Re-assessment for this encounter?: Initial Assessment Marital status: Married [place] name: na Is patient pregnant?: No Pregnancy Status: No Living Arrangements: Spouse/significant other, Children Can pt return to current living arrangement?: Yes Admission Status: Involuntary Is patient capable of signing voluntary admission?:  Yes Referral Source: Self/Family/Friend Insurance type: Medicaid     Crisis Care Plan Living Arrangements: Spouse/significant other, Children Legal Guardian: Other: (self) Name of Psychiatrist: Dr. Janeece Riggers Name of Therapist: Dr. Janeece Riggers  Education Status Is patient currently in school?: No Current Grade: na Highest grade of school patient has completed: na Name of school: na Contact person: na  Risk to self with the past 6 months Suicidal Ideation: No Has patient been a risk to self within the past 6 months prior to admission? : No Suicidal Intent: No Has patient had any suicidal intent within the past 6 months prior  to admission? : No Is patient at risk for suicide?: No Suicidal Plan?: No Has patient had any suicidal plan within the past 6 months prior to admission? : No Access to Means: No What has been your use of drugs/alcohol within the last 12 months?: Pt denies drug/alcohol use Previous Attempts/Gestures: No How many times?: 0 Other Self Harm Risks: 0 Triggers for Past Attempts: None known Intentional Self Injurious Behavior: None Family Suicide History: Yes Recent stressful life event(s): Financial Problems, Loss (Comment), Conflict (Comment), Trauma (Comment), Recent negative physical changes Persecutory voices/beliefs?: No Depression: Yes Depression Symptoms: Loss of interest in usual pleasures, Feeling worthless/self pity, Feeling angry/irritable Substance abuse history and/or treatment for substance abuse?: No Suicide prevention information given to non-admitted patients: Not applicable  Risk to Others within the past 6 months Homicidal Ideation: No Does patient have any lifetime risk of violence toward others beyond the six months prior to admission? : No Thoughts of Harm to Others: No Current Homicidal Intent: No Current Homicidal Plan: No Access to Homicidal Means: No Identified Victim: na History of harm to others?: No Assessment of Violence: None Noted Violent Behavior Description: none identified Does patient have access to weapons?: No Criminal Charges Pending?: No Does patient have a court date: No Is patient on probation?: No  Psychosis Hallucinations: None noted Delusions: None noted  Mental Status Report Appearance/Hygiene: Unremarkable, In scrubs Eye Contact: Good Motor Activity: Freedom of movement Speech: Logical/coherent Level of Consciousness: Alert, Irritable Mood: Irritable Affect: Appropriate to circumstance, Irritable Anxiety Level: Minimal Thought Processes: Relevant, Coherent Judgement: Partial Orientation: Person, Place, Time,  Situation Obsessive Compulsive Thoughts/Behaviors: None  Cognitive Functioning Concentration: Normal Memory: Recent Intact, Remote Intact IQ: Average Insight: Fair Impulse Control: Fair Appetite: Fair Weight Loss: 0 Weight Gain: 0 Sleep: No Change Vegetative Symptoms: None  ADLScreening [facility] Assessment Services) Patient's cognitive ability adequate to safely complete daily activities?: Yes Patient able to express need for assistance with ADLs?: Yes Independently performs ADLs?: Yes (appropriate for developmental age)  Prior Inpatient Therapy Prior Inpatient Therapy: Yes Prior Therapy Dates: 2014 Prior Therapy Facilty/Provider(s): [facility] Reason for Treatment: depression  Prior Outpatient Therapy Prior Outpatient Therapy: Yes Prior Therapy Dates: current Prior Therapy Facilty/Provider(s): Dr. Janeece Riggers Reason for Treatment: anxiety Does patient have an ACCT team?: No Does patient have Intensive In-House Services?  : No Does patient have [facility] services? : No Does patient have P4CC services?: No  ADL Screening (condition at time of admission) Patient's cognitive ability adequate to safely complete daily activities?: Yes Patient able to express need for assistance with ADLs?: Yes Independently performs ADLs?: Yes (appropriate for developmental age)       Abuse/Neglect Assessment (Assessment to be complete while patient is alone) Physical Abuse: Denies Verbal Abuse: Denies Sexual Abuse: Denies Exploitation of patient/patient's resources: Denies Self-Neglect: Denies Values / Beliefs Cultural Requests During Hospitalization: None Spiritual Requests During Hospitalization: None Consults Spiritual  Care Consult Needed: No Social Work Consult Needed: No Merchant navy officer (For Healthcare) Does Patient Have a Medical Advance Directive?: No Would patient like information on creating a medical advance directive?: No - Patient declined    Additional Information 1:1 In Past 12  Months?: No CIRT Risk: No Elopement Risk: No Does patient have medical clearance?: Yes     Disposition:  Disposition Initial Assessment Completed for this Encounter: Yes Disposition of Patient: Other dispositions Other disposition(s): Other (Comment) (Pending [facility] consult)  On Site Evaluation by:   Reviewed with Physician:    [place] 02/10/2017 11:45 PM

## 2017-02-10 NOTE — ED Triage Notes (Signed)
Pt brought in by [facility] under IVC, papers state pt threatening to killer her children and herself.  Pt denies in triage.  Pt tearful in triage, uncooperative but complying with encouragement from staff.

## 2017-02-10 NOTE — ED Provider Notes (Signed)
[facility] Emergency Department Provider Note  ____________________________________________   First MD Initiated Contact with Patient 02/10/17 2124     (approximate)  I have reviewed the triage vital signs and the nursing notes.   HISTORY  Chief Complaint Suicidal   HPI [name] is a 41 y.o. female who is presenting to the emergency department tonight after threatening to kill both herself and her children. She says that she was upset earlier tonight with her husband who she suspects having an appropriate relationship with a woman at his work. She says that she became so frustrated that she threatened to kill herself and her children. She says that she did not mean this and was unaffected of anger and she has no plan to do this. She denies any illicit drug use.   Past Medical History:  Diagnosis Date  . Asthma   . Bleeding hemorrhoid   . GERD (gastroesophageal reflux disease)   . Hypothyroidism   . Seizures [facility])     Patient Active Problem List   Diagnosis Date Noted  . Microscopic hematuria 11/19/2016  . Pelvic pain in female 11/19/2016  . Sacroiliac joint dysfunction 06/03/2015  . DJD (degenerative joint disease) of knee 03/28/2015  . DDD (degenerative disc disease), lumbar 03/06/2015  . Lumbar post-laminectomy syndrome 03/06/2015  . DDD (degenerative disc disease), cervical 03/06/2015  . Degenerative joint disease of sacroiliac joint 03/06/2015  . Bilateral occipital neuralgia 03/06/2015  . Adnexal pain 07/13/2013  . Female genuine stress incontinence 07/13/2013  . Urge incontinence 07/13/2013  . FOM (frequency of micturition) 07/13/2013    Past Surgical History:  Procedure Laterality Date  . CESAREAN SECTION  2003   with tubal ligation   . CHOLESTEATOMA EXCISION  2010   to remove a benign tumor behind R ear drum   . removal of bilateral fallopian tubes Bilateral 2012  . removal of L ovary Left 2012   due a cyst and scar  tissue  . SPINE SURGERY  2012   2 surgeries: to remove a herniated disc and to remove a pus pocket at incision site   . TUBAL LIGATION    . VAGINAL HYSTERECTOMY  2007     due to fibroid cysts    Prior to Admission medications   Medication Sig Start Date End Date Taking? Authorizing Provider  alprazolam Prudy Feeler) 2 MG tablet Take 2 mg by mouth 4 (four) times daily as needed for sleep.    Historical Provider, MD  amphetamine-dextroamphetamine (ADDERALL) 30 MG tablet Take 30 mg by mouth 2 (two) times daily.    Historical Provider, MD  cetirizine (ZYRTEC) 10 MG tablet Take by mouth. Reported on 05/05/2016    Historical Provider, MD  conjugated estrogens (PREMARIN) vaginal cream Place 1 Applicatorful vaginally daily.    Historical Provider, MD  diclofenac (CATAFLAM) 50 MG tablet 1 tab by mouth per day or twice a day if tolerated 06/24/16   Ewing Schlein, MD  diclofenac sodium (VOLTAREN) 1 % GEL Apply 2-4 g to painful area of skin 4 times per day if tolerated. Apply only a maximum of 4 g per application Patient not taking: Reported on 11/19/2016 04/20/16   Ewing Schlein, MD  fluticasone (FLOVENT HFA) 220 MCG/ACT inhaler Inhale 2 puffs into the lungs every 4 (four) hours as needed.    Historical Provider, MD  levocetirizine (XYZAL) 5 MG tablet Take 5 mg by mouth every evening.    Historical Provider, MD  levothyroxine (SYNTHROID, LEVOTHROID) 175 MCG tablet  Take 175 mcg by mouth. 6 days a week    Historical Provider, MD  loratadine (CLARITIN) 10 MG tablet Take 10 mg by mouth daily. Reported on 05/05/2016    Historical Provider, MD  mirabegron ER (MYRBETRIQ) 50 MG TB24 tablet Take 1 tablet (50 mg total) by mouth daily. 11/19/16   Sebastian Ache, MD  montelukast (SINGULAIR) 10 MG tablet Take 10 mg by mouth at bedtime.    Historical Provider, MD  omeprazole (PRILOSEC) 40 MG capsule Take 40 mg by mouth daily.    Historical Provider, MD  orphenadrine (NORFLEX) 100 MG tablet Limit 1 tablet by mouth per day or  twice per day if tolerated  NO ROBAXIN (METHOCARBAMOL) Patient not taking: Reported on 11/19/2016 06/24/16   Ewing Schlein, MD  Oxycodone HCl 10 MG TABS 1-2 tablets by mouth twice a day to 3 times a day if tolerated 06/24/16   Ewing Schlein, MD  pramoxine (PROCTOFOAM) 1 % foam Place 1 application rectally 3 (three) times daily as needed for itching. Patient not taking: Reported on 11/19/2016 04/08/16   Myrna Blazer, MD  traMADol Janean Sark) 50 MG tablet Take by mouth. Reported on 05/05/2016 12/30/14   Historical Provider, MD    Allergies Celecoxib; Darvon [propoxyphene]; and Gabapentin  Family History  Problem Relation Age of Onset  . Alcohol abuse Mother   . Arthritis Mother   . Depression Mother   . Hearing loss Mother   . Alcohol abuse Father   . Arthritis Father   . COPD Father   . Depression Father   . Hearing loss Father   . Hyperlipidemia Father   . Hypertension Father     Social History Social History  Substance Use Topics  . Smoking status: Current Every Day Smoker    Packs/day: 1.00    Types: Cigarettes  . Smokeless tobacco: Never Used  . Alcohol use No     Comment: occasionally    Review of Systems Constitutional: No fever/chills Eyes: No visual changes. ENT: No sore throat. Cardiovascular: Denies chest pain. Respiratory: Denies shortness of breath. Gastrointestinal: No abdominal pain.  No nausea, no vomiting.  No diarrhea.  No constipation. Genitourinary: Negative for dysuria. Musculoskeletal: Negative for back pain. Skin: Negative for rash. Neurological: Negative for headaches, focal weakness or numbness.  10-point ROS otherwise negative.  ____________________________________________   PHYSICAL EXAM:  VITAL SIGNS: ED Triage Vitals  Enc Vitals Group     BP 02/10/17 2107 138/81     Pulse Rate 02/10/17 2107 79     Resp 02/10/17 2107 20     Temp 02/10/17 2107 98 F (36.7 C)     Temp Source 02/10/17 2107 Oral     SpO2 02/10/17 2107 96 %      Weight 02/10/17 2108 160 lb (72.6 kg)     Height 02/10/17 2108  (1.575 m)     Head Circumference --      Peak Flow --      Pain Score 02/10/17 2107 8     Pain Loc --      Pain Edu? --      Excl. in GC? --     Constitutional: Alert and oriented. Well appearing and in no acute distress. Eyes: Conjunctivae are normal. PERRL. EOMI. Head: Atraumatic. Nose: No congestion/rhinnorhea. Mouth/Throat: Mucous membranes are moist.   Neck: No stridor.   Cardiovascular: Normal rate, regular rhythm. Grossly normal heart sounds.   Respiratory: Normal respiratory effort.  No retractions. Lungs CTAB. Gastrointestinal: Soft and  nontender. No distention.  Musculoskeletal: No lower extremity tenderness nor edema.  No joint effusions. Neurologic:  Normal speech and language. No gross focal neurologic deficits are appreciated.  Skin:  Skin is warm, dry and intact. No rash noted. Psychiatric: Mood and affect are normal. Speech and behavior are normal.  ____________________________________________   LABS (all labs ordered are listed, but only abnormal results are displayed)  Labs Reviewed  COMPREHENSIVE METABOLIC PANEL - Abnormal; Notable for the following:       Result Value   Potassium 3.0 (*)    Glucose, Bld 107 (*)    BUN 5 (*)    ALT 10 (*)    All other components within normal limits  ACETAMINOPHEN LEVEL - Abnormal; Notable for the following:    Acetaminophen (Tylenol), Serum <10 (*)    All other components within normal limits  URINE DRUG SCREEN, QUALITATIVE ([facility] ONLY) - Abnormal; Notable for the following:    Amphetamines, Ur Screen POSITIVE (*)    All other components within normal limits  ETHANOL  SALICYLATE LEVEL  CBC   ____________________________________________  EKG   ____________________________________________  RADIOLOGY   ____________________________________________   PROCEDURES  Procedure(s) performed:   Procedures  Critical Care performed:    ____________________________________________   INITIAL IMPRESSION / ASSESSMENT AND PLAN / ED COURSE  Pertinent labs & imaging results that were available during my care of the patient were reviewed by me and considered in my medical decision making (see chart for details).  I will uphold the IVC paperwork that the patient arrived with. The patient understands the need for further evaluation with psychiatry.      ____________________________________________   FINAL CLINICAL IMPRESSION(S) / ED DIAGNOSES  Suicidal ideation. Homicidal ideation.    NEW MEDICATIONS STARTED DURING THIS VISIT:  New Prescriptions   No medications on file     Note:  This document was prepared using Dragon voice recognition software and may include unintentional dictation errors.    Myrna Blazer, MD 02/10/17 [phone]

## 2017-02-11 ENCOUNTER — Inpatient Hospital Stay
Admission: RE | Admit: 2017-02-11 | Discharge: 2017-02-14 | DRG: 885 | Disposition: A | Discharge status: Home / Self Care | Payer: Medicaid Other | Source: Intra-hospital | Attending: Psychiatry | Admitting: Psychiatry

## 2017-02-11 DIAGNOSIS — G4700 Insomnia, unspecified: Secondary | ICD-10-CM | POA: Diagnosis present

## 2017-02-11 DIAGNOSIS — R52 Pain, unspecified: Secondary | ICD-10-CM

## 2017-02-11 DIAGNOSIS — Z818 Family history of other mental and behavioral disorders: Secondary | ICD-10-CM | POA: Diagnosis not present

## 2017-02-11 DIAGNOSIS — G8929 Other chronic pain: Secondary | ICD-10-CM | POA: Diagnosis present

## 2017-02-11 DIAGNOSIS — E039 Hypothyroidism, unspecified: Secondary | ICD-10-CM | POA: Diagnosis present

## 2017-02-11 DIAGNOSIS — R45851 Suicidal ideations: Secondary | ICD-10-CM | POA: Diagnosis present

## 2017-02-11 DIAGNOSIS — R3915 Urgency of urination: Secondary | ICD-10-CM | POA: Diagnosis present

## 2017-02-11 DIAGNOSIS — K219 Gastro-esophageal reflux disease without esophagitis: Secondary | ICD-10-CM | POA: Diagnosis present

## 2017-02-11 DIAGNOSIS — F4325 Adjustment disorder with mixed disturbance of emotions and conduct: Secondary | ICD-10-CM | POA: Diagnosis present

## 2017-02-11 DIAGNOSIS — F17210 Nicotine dependence, cigarettes, uncomplicated: Secondary | ICD-10-CM | POA: Diagnosis present

## 2017-02-11 DIAGNOSIS — F909 Attention-deficit hyperactivity disorder, unspecified type: Secondary | ICD-10-CM | POA: Diagnosis present

## 2017-02-11 DIAGNOSIS — F332 Major depressive disorder, recurrent severe without psychotic features: Secondary | ICD-10-CM | POA: Diagnosis present

## 2017-02-11 DIAGNOSIS — F17200 Nicotine dependence, unspecified, uncomplicated: Secondary | ICD-10-CM | POA: Diagnosis present

## 2017-02-11 DIAGNOSIS — Z888 Allergy status to other drugs, medicaments and biological substances status: Secondary | ICD-10-CM | POA: Diagnosis not present

## 2017-02-11 DIAGNOSIS — F410 Panic disorder [episodic paroxysmal anxiety] without agoraphobia: Secondary | ICD-10-CM | POA: Diagnosis present

## 2017-02-11 DIAGNOSIS — N3281 Overactive bladder: Secondary | ICD-10-CM | POA: Diagnosis present

## 2017-02-11 DIAGNOSIS — F329 Major depressive disorder, single episode, unspecified: Secondary | ICD-10-CM | POA: Diagnosis present

## 2017-02-11 DIAGNOSIS — N3941 Urge incontinence: Secondary | ICD-10-CM | POA: Diagnosis present

## 2017-02-11 MED ORDER — LEVOTHYROXINE SODIUM 175 MCG PO TABS: 175.0000 ug | ORAL_TABLET | Freq: Every day | ORAL | Status: DC

## 2017-02-11 MED ORDER — ALUM & MAG HYDROXIDE-SIMETH 200-200-20 MG/5ML PO SUSP: 30.0000 mL | ORAL | Status: DC | PRN

## 2017-02-11 MED ORDER — MAGNESIUM HYDROXIDE 400 MG/5ML PO SUSP: 30.0000 mL | Freq: Every day | ORAL | Status: DC | PRN

## 2017-02-11 MED ORDER — ACETAMINOPHEN 325 MG PO TABS
650.0000 mg | ORAL_TABLET | Freq: Four times a day (QID) | ORAL | Status: DC | PRN
  Filled 2017-02-11: qty 2

## 2017-02-11 NOTE — ED Notes (Signed)
BEHAVIORAL HEALTH ROUNDING Patient sleeping: No. Patient alert and oriented: yes Behavior appropriate: Yes.  ; If no, describe:  Nutrition and fluids offered: yes Toileting and hygiene offered: Yes  Sitter present: q15 minute observations and security  monitoring Law enforcement present: Yes  ODS  

## 2017-02-11 NOTE — ED Notes (Signed)
Patient in bathroom

## 2017-02-11 NOTE — ED Notes (Signed)
Lunch was given to patient 

## 2017-02-11 NOTE — ED Notes (Signed)
Pt reassured that consultant will be coming to assess her  - tearful  - "I just want to go home - I want to see my kids."    16, 18 and a 41 yo

## 2017-02-11 NOTE — ED Notes (Signed)
ED BHU PLACEMENT JUSTIFICATION Is the patient under IVC or is there intent for IVC: Yes.   Is the patient medically cleared: Yes.   Is there vacancy in the ED BHU: Yes.   Is the population mix appropriate for patient: Yes.   Is the patient awaiting placement in inpatient or outpatient setting:  Has the patient had a psychiatric consult:  Consult pending  Survey of unit performed for contraband, proper placement and condition of furniture, tampering with fixtures in bathroom, shower, and each patient room: Yes.  ; Findings:  APPEARANCE/BEHAVIOR Calm and cooperative NEURO ASSESSMENT Orientation: oriented x 4  Denies pain Hallucinations: No.None noted (Hallucinations) Speech: Normal Gait: normal RESPIRATORY ASSESSMENT Even  Unlabored respirations  CARDIOVASCULAR ASSESSMENT Pulses equal   regular rate  Skin warm and dry   GASTROINTESTINAL ASSESSMENT no GI complaint EXTREMITIES Full ROM  PLAN OF CARE Provide calm/safe environment. Vital signs assessed twice daily. ED BHU Assessment once each 12-hour shift. Collaborate with TTS daily or as condition indicates. Assure the ED provider has rounded once each shift. Provide and encourage hygiene. Provide redirection as needed. Assess for escalating behavior; address immediately and inform ED provider.  Assess family dynamic and appropriateness for visitation as needed: Yes.  ; If necessary, describe findings:  Educate the patient/family about BHU procedures/visitation: Yes.  ; If necessary, describe findings:   

## 2017-02-11 NOTE — ED Notes (Signed)
Calvin from intake in room with patient

## 2017-02-11 NOTE — ED Notes (Addendum)
Pt refused to be moved to BHU. States she is not leaving this room unless she get discharge. Pt is tearful. Pt is not discharge by EDP at this time. Pt made aware.

## 2017-02-11 NOTE — ED Notes (Signed)

## 2017-02-11 NOTE — ED Notes (Signed)
Patient in room crying stating she just wants to home .

## 2017-02-11 NOTE — ED Notes (Signed)
Phone provided to her early due to her nonstop crying -  She can be heard in the hallway crying- reasoning with her spouse  Continue to monitor

## 2017-02-11 NOTE — ED Notes (Signed)
Sprite was given to patient 

## 2017-02-11 NOTE — ED Notes (Signed)
Patient in room pacing back and forth

## 2017-02-11 NOTE — BH Assessment (Signed)
Patient is to be admitted to [facility] by Dr. Toni Amend.  Attending Physician will be Dr. Jennet Maduro.   Patient has been assigned to room 305, by [facility] Charge Nurse [place].   Intake Paper Work has been signed and placed on patient chart.  ER staff is aware of the admission (Dr. Darnelle Catalan, ER MD; Murlean Iba., Patient's Nurse & Pedro Earls, Patient Access).

## 2017-02-11 NOTE — ED Notes (Addendum)
She can be heard into the hallway crying  Pt reassured  - awaiting to see psychiatrist today   No am meds to be administered  Assessment conmpleted   Continue to monitor

## 2017-02-11 NOTE — ED Notes (Signed)
Patient in room crying  

## 2017-02-11 NOTE — BH Assessment (Signed)
Writer spoke with patient to complete reassessment to gauge patient's current mental and emotional state. Patient denies SI/HI and AV/H. Writer asked about the safety of her children and any concerns, patient states , "I would not hurt them. I don't even spank my children." She further explains, she made the statement of harming them because she was upset with her husband. "I wanted him to feel the pain I was feeling. I wanted him to hurt. I said that, because I wanted him to have a reality check of what it would be like without [place]. What it would be like if me and the kids leave him. Not kill [place]." She also states, they do not have any children together. She have two children from previous relationship, ages 52 and 29. Her husband have a child that is four years old and it's from another relationship.    Writer informed the patient he would need to call someone to verify her story and assess if there are any safety concerns in the home. Patient voiced her understanding. When asked if there was anyone in particular, she stated she didn't have anyone and it was okay to call the husband. "I really don't trust people like that. They say they love you and then they stab you in the back." During the time writer was talking with the patient, she was tearful and guarded.   Writer called and left a HIPPA Compliant message with husband ([phone]), requesting a return phone call.

## 2017-02-11 NOTE — ED Notes (Signed)
Pt to be admitted to [facility] after change of shift - bed to be assigned   NAD observed  She has used the telephone to call and check on her children

## 2017-02-11 NOTE — ED Notes (Signed)
Dr clapaac and student in room with patient at this time

## 2017-02-11 NOTE — ED Notes (Signed)
IVC/Consult completed/Plan to admit when bed available

## 2017-02-11 NOTE — ED Notes (Signed)
BEHAVIORAL HEALTH ROUNDING Patient sleeping: No. Patient alert and oriented: yes Behavior appropriate: Yes.  ; If no, describe:  Nutrition and fluids offered: yes Toileting and hygiene offered: Yes  Sitter present: q15 minute observations and security monitoring Law enforcement present: Yes  ODS  She can be heard crying in her room  - reassurance provided

## 2017-02-11 NOTE — Progress Notes (Signed)
EKG completed and given to staff to place on pt's chart 

## 2017-02-11 NOTE — ED Provider Notes (Signed)
Patient evaluated by Dr. Jacky Kindle specialist on call psychiatry who recommended reversal of involuntary commitment and discharged home. However given patient's threat to self and family as well as history of psychiatric illness with previous suicide attempt I disagree with this recommendation at this time. I spoke with the patient and informed her that she would remain here overnight to be evaluated by the psychiatrist in the morning at which she became very irate. Spoke with the patient at length advising her of the necessity of doing so. Patient very tearful and informed me of her husband's infidelity and feelings of being overwhelmed. However patient states that she would never hurt herself or her children.   Darci Current, MD 02/11/17 [phone]

## 2017-02-11 NOTE — Consult Note (Signed)
[facility] Consult   Reason for Consult:  Consult for 41 year old woman with a past history of depression and behavior problems came into the hospital after threatening to kill herself and her adolescent children Referring Physician:  Jimmye Norman Patient Identification: [name] MRN:  [id number] Principal Diagnosis: Adjustment disorder with mixed disturbance of emotions and conduct Diagnosis:   Patient Active Problem List   Diagnosis Date Noted  . Adjustment disorder with mixed disturbance of emotions and conduct [F43.25] 02/11/2017  . Severe recurrent major depression without psychotic features ([place]) [F33.2] 02/11/2017  . Microscopic hematuria [R31.29] 11/19/2016  . Pelvic pain in female [R10.2] 11/19/2016  . 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 DDD (degenerative disc disease), lumbar [M51.36] 03/06/2015  . Lumbar post-laminectomy syndrome [M96.1] 03/06/2015  . DDD (degenerative disc disease), cervical [M50.30] 03/06/2015  . 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 Bilateral occipital neuralgia [M54.81] 03/06/2015  . Adnexal pain [R10.2] 07/13/2013  . Female genuine stress incontinence [N39.3] 07/13/2013  . Urge incontinence [N39.41] 07/13/2013  . FOM (frequency of micturition) [R35.0] 07/13/2013    Total Time spent with patient: 1 hour  Subjective:   [name] is a 41 y.o. female patient admitted with "I'm not really suicidal".  HPI:  Patient interviewed. Chart reviewed. 41 year old woman came to the hospital last night under commitment. She had been involved in an argument via Facebook messages with her husband. In the course of that she admits that she made a comment about killing herself and killing her children. Soon after that she was surprised to find that her husband had called the police and had them come over to her house. He had filed  commitment papers on her. Patient admits that her mood is been upset labile and agitated. She blames this on the problems in her marriage. Apparently she and her husband only got married in January and already he is cheating on her and they are breaking up. Sounds like there is constant arguing and a lot of drama around it. Mood is upset and sad. Admits that she has suicidal thoughts that cross her mind with some frequency but says that she would never do it. She says that she is currently taking the medicine prescribed by her outpatient psychiatrist. Occasionally drinks alcohol but portrays it as being not abusive and denies having used any drugs for years.  Social history: Married. The patient herself has 2 adolescent age daughters who still live with her. She is not working but receives disability.  Medical history: History of some chronic pain issues hypothyroidism  Substance abuse history: Admits to occasional alcohol use and says that she used to abuse drugs. Denies that she's been abusing anything recently although she reports that she is prescribed multiple controlled substances  Past Psychiatric History: Patient has a past history of psychiatric admission and a past history of suicide attempts. Currently sees an outpatient psychiatrist but claims she is not actually on any antidepressants but rather takes Adderall Xanax mostly.  Risk to Self: Suicidal Ideation: No Suicidal Intent: No Is patient at risk for suicide?: No Suicidal Plan?: No Access to Means: No What has been your use of drugs/alcohol within the last 12 months?: Pt denies drug/alcohol use How many times?: 0 Other Self Harm Risks: 0 Triggers for Past Attempts: None known Intentional Self Injurious Behavior: None Risk to Others: Homicidal Ideation: No Thoughts of Harm to Others: No Current Homicidal  Intent: No Current Homicidal Plan: No Access to Homicidal Means: No Identified Victim: na History of harm to others?:  No Assessment of Violence: None Noted Violent Behavior Description: none identified Does patient have access to weapons?: No Criminal Charges Pending?: No Does patient have a court date: No Prior Inpatient Therapy: Prior Inpatient Therapy: Yes Prior Therapy Dates: 2014 Prior Therapy Facilty/Provider(s): [facility] Reason for Treatment: depression Prior Outpatient Therapy: Prior Outpatient Therapy: Yes Prior Therapy Dates: current Prior Therapy Facilty/Provider(s): Dr. Kasandra Knudsen Reason for Treatment: anxiety Does patient have an ACCT team?: No Does patient have Intensive In-House Services?  : No Does patient have [facility] services? : No Does patient have P4CC services?: No  Past Medical History:  Past Medical History:  Diagnosis Date  . Asthma   . Bleeding hemorrhoid   . GERD (gastroesophageal reflux disease)   . Hypothyroidism   . Seizures ([place])     Past Surgical History:  Procedure Laterality Date  . CESAREAN SECTION  2003   with tubal ligation   . CHOLESTEATOMA EXCISION  2010   to remove a benign tumor behind R ear drum   . removal of bilateral fallopian tubes Bilateral 2012  . removal of L ovary Left 2012   due a cyst and scar tissue  . SPINE SURGERY  2012   2 surgeries: to remove a herniated disc and to remove a pus pocket at incision site   . TUBAL LIGATION    . VAGINAL HYSTERECTOMY  2007     due to fibroid cysts   Family History:  Family History  Problem Relation Age of Onset  . Alcohol abuse Mother   . Arthritis Mother   . Depression Mother   . Hearing loss Mother   . Alcohol abuse Father   . Arthritis Father   . COPD Father   . Depression Father   . Hearing loss Father   . Hyperlipidemia Father   . Hypertension Father    Family Psychiatric  History: Patient had an uncle who committed suicide. She also had an ex-husband who had committed suicide although it was not obviously a biological relative. Social History:  History  Alcohol Use No    Comment: occasionally      History  Drug Use No    Social History   Social History  . Marital status: Married    Spouse name: N/A  . Number of children: N/A  . Years of education: N/A   Social History Main Topics  . Smoking status: Current Every Day Smoker    Packs/day: 1.00    Types: Cigarettes  . Smokeless tobacco: Never Used  . Alcohol use No     Comment: occasionally  . Drug use: No  . Sexual activity: Yes   Other Topics Concern  . None   Social History Narrative  . None   Additional Social History:    Allergies:   Allergies  Allergen Reactions  . Celecoxib Other (See Comments)  . Darvon [Propoxyphene] Hypertension    Other reaction(s): Unknown Heart race  . Gabapentin Rash    Labs:  Results for orders placed or performed during the hospital encounter of 02/10/17 (from the past 48 hour(s))  Comprehensive metabolic panel     Status: Abnormal   Collection Time: 02/10/17  9:08 PM  Result Value Ref Range   Sodium 138 135 - 145 mmol/L   Potassium 3.0 (L) 3.5 - 5.1 mmol/L   Chloride 106 101 - 111 mmol/L   CO2 25 22 -  32 mmol/L   Glucose, Bld 107 (H) 65 - 99 mg/dL   BUN 5 (L) 6 - 20 mg/dL   Creatinine, Ser 0.52 0.44 - 1.00 mg/dL   Calcium 9.0 8.9 - 10.3 mg/dL   Total Protein 7.1 6.5 - 8.1 g/dL   Albumin 3.7 3.5 - 5.0 g/dL   AST 15 15 - 41 U/L   ALT 10 (L) 14 - 54 U/L   Alkaline Phosphatase 58 38 - 126 U/L   Total Bilirubin 0.4 0.3 - 1.2 mg/dL   GFR calc non Af Amer >60 >60 mL/min   GFR calc Af Amer >60 >60 mL/min    Comment: (NOTE) The eGFR has been calculated using the CKD EPI equation. This calculation has not been validated in all clinical situations. eGFR's persistently <60 mL/min signify possible Chronic Kidney Disease.    Anion gap 7 5 - 15  Ethanol     Status: None   Collection Time: 02/10/17  9:08 PM  Result Value Ref Range   Alcohol, Ethyl (B) <5 <5 mg/dL    Comment:        LOWEST DETECTABLE LIMIT FOR SERUM ALCOHOL IS 5 mg/dL FOR MEDICAL PURPOSES ONLY    Salicylate level     Status: None   Collection Time: 02/10/17  9:08 PM  Result Value Ref Range   Salicylate Lvl <9.8 2.8 - 30.0 mg/dL  Acetaminophen level     Status: Abnormal   Collection Time: 02/10/17  9:08 PM  Result Value Ref Range   Acetaminophen (Tylenol), Serum <10 (L) 10 - 30 ug/mL    Comment:        THERAPEUTIC CONCENTRATIONS VARY SIGNIFICANTLY. A RANGE OF 10-30 ug/mL MAY BE AN EFFECTIVE CONCENTRATION FOR MANY PATIENTS. HOWEVER, SOME ARE BEST TREATED AT CONCENTRATIONS OUTSIDE THIS RANGE. ACETAMINOPHEN CONCENTRATIONS >150 ug/mL AT 4 HOURS AFTER INGESTION AND >50 ug/mL AT 12 HOURS AFTER INGESTION ARE OFTEN ASSOCIATED WITH TOXIC REACTIONS.   cbc     Status: None   Collection Time: 02/10/17  9:08 PM  Result Value Ref Range   WBC 6.3 3.6 - 11.0 K/uL   RBC 4.58 3.80 - 5.20 MIL/uL   Hemoglobin 13.5 12.0 - 16.0 g/dL   HCT 40.1 35.0 - 47.0 %   MCV 87.4 80.0 - 100.0 fL   MCH 29.4 26.0 - 34.0 pg   MCHC 33.7 32.0 - 36.0 g/dL   RDW 13.9 11.5 - 14.5 %   Platelets 369 150 - 440 K/uL  Urine Drug Screen, Qualitative     Status: Abnormal   Collection Time: 02/10/17  9:23 PM  Result Value Ref Range   Tricyclic, Ur Screen NONE DETECTED NONE DETECTED   Amphetamines, Ur Screen POSITIVE (A) NONE DETECTED   MDMA (Ecstasy)Ur Screen NONE DETECTED NONE DETECTED   Cocaine Metabolite,Ur [place] NONE DETECTED NONE DETECTED   Opiate, Ur Screen NONE DETECTED NONE DETECTED   Phencyclidine (PCP) Ur S NONE DETECTED NONE DETECTED   Cannabinoid 50 Ng, Ur [place] NONE DETECTED NONE DETECTED   Barbiturates, Ur Screen NONE DETECTED NONE DETECTED   Benzodiazepine, Ur Scrn NONE DETECTED NONE DETECTED   Methadone Scn, Ur NONE DETECTED NONE DETECTED    Comment: (NOTE) 264  Tricyclics, urine               Cutoff 1000 ng/mL 200  Amphetamines, urine             Cutoff 1000 ng/mL 300  MDMA (Ecstasy), urine  Cutoff 500 ng/mL 400  Cocaine Metabolite, urine       Cutoff 300 ng/mL 500  Opiate, urine                    Cutoff 300 ng/mL 600  Phencyclidine (PCP), urine      Cutoff 25 ng/mL 700  Cannabinoid, urine              Cutoff 50 ng/mL 800  Barbiturates, urine             Cutoff 200 ng/mL 900  Benzodiazepine, urine           Cutoff 200 ng/mL 1000 Methadone, urine                Cutoff 300 ng/mL 1100 1200 The urine drug screen provides only a preliminary, unconfirmed 1300 analytical test result and should not be used for non-medical 1400 purposes. Clinical consideration and professional judgment should 1500 be applied to any positive drug screen result due to possible 1600 interfering substances. A more specific alternate chemical method 1700 must be used in order to obtain a confirmed analytical result.  1800 Gas chromato graphy / mass spectrometry (GC/MS) is the preferred 1900 confirmatory method.     Current Facility-Administered Medications  Medication Dose Route Frequency Provider Last Rate Last Dose  . bupivacaine (PF) (MARCAINE) 0.25 % injection 30 mL  30 mL Other Once Mohammed Kindle, MD      . bupivacaine (PF) (MARCAINE) 0.25 % injection 30 mL  30 mL Other Once Mohammed Kindle, MD      . bupivacaine (PF) (MARCAINE) 0.25 % injection 30 mL  30 mL Other Once Mohammed Kindle, MD      . bupivacaine (PF) (MARCAINE) 0.25 % injection 30 mL  30 mL Other Once Mohammed Kindle, MD      . ceFAZolin (ANCEF) IVPB 1 g/50 mL premix  1 g Intravenous Once Mohammed Kindle, MD      . fentaNYL (SUBLIMAZE) injection 100 mcg  100 mcg Intravenous UD Mohammed Kindle, MD   100 mcg at 07/09/15 1140  . fentaNYL (SUBLIMAZE) injection 100 mcg  100 mcg Intravenous Once Mohammed Kindle, MD      . fentaNYL (SUBLIMAZE) injection 100 mcg  100 mcg Intravenous Once Mohammed Kindle, MD      . fentaNYL (SUBLIMAZE) injection 100 mcg  100 mcg Intravenous Once Mohammed Kindle, MD      . fentaNYL (SUBLIMAZE) injection 100 mcg  100 mcg Intravenous Once Mohammed Kindle, MD      . lactated ringers infusion 1,000 mL  1,000 mL Intravenous  Continuous Mohammed Kindle, MD      . lactated ringers infusion 1,000 mL  1,000 mL Intravenous Continuous Mohammed Kindle, MD      . lactated ringers infusion 1,000 mL  1,000 mL Intravenous Continuous Mohammed Kindle, MD      . lactated ringers infusion 1,000 mL  1,000 mL Intravenous Continuous Mohammed Kindle, MD      . lactated ringers infusion 1,000 mL  1,000 mL Intravenous Continuous Mohammed Kindle, MD      . lactated ringers infusion 1,000 mL  1,000 mL Intravenous Continuous Mohammed Kindle, MD 125 mL/hr at 05/05/16 1123 1,000 mL at 05/05/16 1123  . lidocaine (PF) (XYLOCAINE) 1 % injection 10 mL  10 mL Subcutaneous Once Mohammed Kindle, MD      . midazolam (VERSED) 5 MG/5ML injection 5 mg  5 mg Intravenous UD Mohammed Kindle, MD   5 mg at  07/09/15 1141  . midazolam (VERSED) 5 MG/5ML injection 5 mg  5 mg Intravenous Once Mohammed Kindle, MD      . midazolam (VERSED) 5 MG/5ML injection 5 mg  5 mg Intravenous Once Mohammed Kindle, MD      . midazolam (VERSED) 5 MG/5ML injection 5 mg  5 mg Intravenous Once Mohammed Kindle, MD      . midazolam (VERSED) 5 MG/5ML injection 5 mg  5 mg Intravenous Once Mohammed Kindle, MD      . orphenadrine (NORFLEX) injection 60 mg  60 mg Intramuscular Once Mohammed Kindle, MD      . orphenadrine (NORFLEX) injection 60 mg  60 mg Intramuscular Once Mohammed Kindle, MD      . orphenadrine (NORFLEX) injection 60 mg  60 mg Intramuscular Once Mohammed Kindle, MD      . orphenadrine (NORFLEX) injection 60 mg  60 mg Intramuscular Once Mohammed Kindle, MD      . triamcinolone acetonide (KENALOG-40) injection 40 mg  40 mg Other Once Mohammed Kindle, MD      . triamcinolone acetonide [facility]) injection 40 mg  40 mg Other Once Mohammed Kindle, MD      . triamcinolone acetonide [facility]) injection 40 mg  40 mg Other Once Mohammed Kindle, MD      . triamcinolone acetonide [facility]) injection 40 mg  40 mg Other Once Mohammed Kindle, MD       Current Outpatient Prescriptions  Medication Sig Dispense Refill   . alprazolam (XANAX) 2 MG tablet Take 2 mg by mouth 4 (four) times daily as needed for sleep or anxiety.     Marland Kitchen amphetamine-dextroamphetamine (ADDERALL) 30 MG tablet Take 30 mg by mouth 3 (three) times daily.     . diclofenac (CATAFLAM) 50 MG tablet 1 tab by mouth per day or twice a day if tolerated (Patient taking differently: Take 50 mg by mouth 2 (two) times daily. ) 60 tablet 2  . levocetirizine (XYZAL) 5 MG tablet Take 5 mg by mouth every evening.    Marland Kitchen levothyroxine (SYNTHROID, LEVOTHROID) 175 MCG tablet Take 175 mcg by mouth. 6 days a week    . mirabegron ER (MYRBETRIQ) 50 MG TB24 tablet Take 1 tablet (50 mg total) by mouth daily. 90 tablet 3  . Oxycodone HCl 10 MG TABS 1-2 tablets by mouth twice a day to 3 times a day if tolerated 180 tablet 0  . diclofenac sodium (VOLTAREN) 1 % GEL Apply 2-4 g to painful area of skin 4 times per day if tolerated. Apply only a maximum of 4 g per application (Patient not taking: Reported on 11/19/2016) 500 g 0  . orphenadrine (NORFLEX) 100 MG tablet Limit 1 tablet by mouth per day or twice per day if tolerated  NO ROBAXIN (METHOCARBAMOL) (Patient not taking: Reported on 11/19/2016) 50 tablet 0  . pramoxine (PROCTOFOAM) 1 % foam Place 1 application rectally 3 (three) times daily as needed for itching. (Patient not taking: Reported on 11/19/2016) 15 g 0    Musculoskeletal: Strength & Muscle Tone: within normal limits Gait & Station: normal Patient leans: N/A  Psychiatric Specialty Exam: Physical Exam  Nursing note and vitals reviewed. Constitutional: She appears well-developed and well-nourished.  HENT:  Head: Normocephalic and atraumatic.  Eyes: Conjunctivae are normal. Pupils are equal, round, and reactive to light.  Neck: Normal range of motion.  Cardiovascular: Regular rhythm and normal heart sounds.   Respiratory: Effort normal. No respiratory distress.  GI: Soft.  Musculoskeletal: Normal range of motion.  Neurological: She is alert.  Skin: Skin  is warm and dry.  Psychiatric: Her mood appears anxious. Her affect is labile. She is agitated. She expresses impulsivity and inappropriate judgment. She exhibits a depressed mood. She expresses no suicidal ideation. She exhibits abnormal recent memory and abnormal remote memory.    Review of Systems  Constitutional: Negative.   HENT: Negative.   Eyes: Negative.   Respiratory: Negative.   Cardiovascular: Negative.   Gastrointestinal: Negative.   Musculoskeletal: Negative.   Skin: Negative.   Neurological: Negative.   Psychiatric/Behavioral: Positive for depression and suicidal ideas. Negative for hallucinations, memory loss and substance abuse. The patient is nervous/anxious. The patient does not have insomnia.     Blood pressure 105/77, pulse 72, temperature 98.4 F (36.9 C), temperature source Oral, resp. rate 16, height '5\' 2"'  (1.575 m), weight 72.6 kg (160 lb), SpO2 97 %.Body mass index is 29.26 kg/m.  General Appearance: Disheveled  Eye Contact:  Fair  Speech:  Garbled and Slurred  Volume:  Normal  Mood:  Dysphoric and Irritable  Affect:  Labile and Tearful  Thought Process:  Disorganized  Orientation:  Full (Time, Place, and Person)  Thought Content:  Illogical, Rumination and Tangential  Suicidal Thoughts:  Yes.  without intent/plan  Homicidal Thoughts:  No  Memory:  Immediate;   Fair Recent;   Fair Remote;   Fair  Judgement:  Poor  Insight:  Shallow  Psychomotor Activity:  Psychomotor Retardation  Concentration:  Concentration: Poor  Recall:  [place] of Knowledge:  Good  Language:  Good  Akathisia:  No  Handed:  Right  AIMS (if indicated):     Assets:  Housing Physical Health Resilience  ADL's:  Intact  Cognition:  Impaired,  Mild  Sleep:        Treatment Plan Summary: Daily contact with patient to assess and evaluate symptoms and progress in treatment, Medication management and Plan 41 year old woman with a history of mood instability. History of  depression behavior that could be consistent with borderline personality disorder. Presented after admittedly stating that she was going to kill her self and kill her daughters. Apparently she continued in this vain and did not back off of it during the conversation although she has consistently said here in the emergency room that she has absolutely no intention to do any such thing. Patient remains emotionally unstable and agitated. P[facility] evaluation last night had recommended discharge but Dr. Owens Shark had felt uncomfortable with that. On reevaluation today I agree that I feel uncomfortable discharging her immediately. Continue the IVC. Recommend admission to psychiatric unit when a bed is available. Case reviewed with TTS.  Disposition: Recommend psychiatric Inpatient admission when medically cleared. Supportive therapy provided about ongoing stressors.  Alethia Berthold, MD 02/11/2017 3:33 PM

## 2017-02-11 NOTE — ED Notes (Signed)
She has ambulated to the BR  Greeted her but she did not respond - entered her room and closed the door  NAD observed  A ponytail holder remains in her hair - I will attempt to obtain due to policy

## 2017-02-12 DIAGNOSIS — F332 Major depressive disorder, recurrent severe without psychotic features: Principal | ICD-10-CM

## 2017-02-12 LAB — LIPID PANEL
Cholesterol: 234 mg/dL — ABNORMAL HIGH (ref 0–200)
HDL: 43 mg/dL (ref >40)
LDL CALC: 172 mg/dL — AB (ref 0–99)
Total CHOL/HDL Ratio: 5.4 RATIO
Triglycerides: 94 mg/dL (ref <150)
VLDL: 19 mg/dL (ref 0–40)

## 2017-02-12 LAB — TSH: TSH: 0.034 u[IU]/mL — AB (ref 0.350–4.500)

## 2017-02-12 MED ORDER — PANTOPRAZOLE SODIUM 40 MG PO TBEC
40.0000 mg | DELAYED_RELEASE_TABLET | Freq: Every day | ORAL | Status: DC
  Administered 2017-02-12 – 2017-02-14 (×3): 40 mg via ORAL
  Filled 2017-02-12 (×3): qty 1

## 2017-02-12 MED ORDER — OXYCODONE HCL 5 MG PO TABS
10.0000 mg | ORAL_TABLET | Freq: Three times a day (TID) | ORAL | Status: DC | PRN
  Administered 2017-02-12 – 2017-02-14 (×6): 10 mg via ORAL
  Filled 2017-02-12 (×6): qty 2

## 2017-02-12 MED ORDER — AMPHETAMINE-DEXTROAMPHETAMINE 5 MG PO TABS
15.0000 mg | ORAL_TABLET | Freq: Every day | ORAL | Status: DC
  Administered 2017-02-12 – 2017-02-13 (×2): 15 mg via ORAL
  Filled 2017-02-12 (×2): qty 3

## 2017-02-12 MED ORDER — MIRABEGRON ER 25 MG PO TB24
25.0000 mg | ORAL_TABLET | Freq: Every day | ORAL | Status: DC
  Administered 2017-02-12 – 2017-02-14 (×3): 25 mg via ORAL
  Filled 2017-02-12 (×5): qty 1

## 2017-02-12 MED ORDER — LORATADINE 10 MG PO TABS
10.0000 mg | ORAL_TABLET | Freq: Every day | ORAL | Status: DC
Start: 2017-02-12 — End: 2017-02-14
  Administered 2017-02-12 – 2017-02-14 (×3): 10 mg via ORAL
  Filled 2017-02-12 (×3): qty 1

## 2017-02-12 MED ORDER — LEVOTHYROXINE SODIUM 150 MCG PO TABS
150.0000 ug | ORAL_TABLET | Freq: Every day | ORAL | Status: DC
  Administered 2017-02-13 – 2017-02-14 (×2): 150 ug via ORAL
  Filled 2017-02-12 (×3): qty 1

## 2017-02-12 MED ORDER — DICLOFENAC SODIUM 25 MG PO TBEC: 100.0000 mg | DELAYED_RELEASE_TABLET | Freq: Every day | ORAL | Status: DC | PRN

## 2017-02-12 MED ORDER — ALPRAZOLAM 1 MG PO TABS
2.0000 mg | ORAL_TABLET | Freq: Two times a day (BID) | ORAL | Status: DC | PRN
  Administered 2017-02-12 – 2017-02-13 (×2): 2 mg via ORAL
  Filled 2017-02-12 (×2): qty 2

## 2017-02-12 MED ORDER — MONTELUKAST SODIUM 10 MG PO TABS
10.0000 mg | ORAL_TABLET | Freq: Every day | ORAL | Status: DC
  Administered 2017-02-12 – 2017-02-13 (×2): 10 mg via ORAL
  Filled 2017-02-12 (×2): qty 1

## 2017-02-12 NOTE — H&P (Addendum)
Psychiatric Admission Assessment Adult  Patient Identification: [name] MRN:  [id number] Date of Evaluation:  02/12/2017 Chief Complaint:  Depression Principal Diagnosis: <principal problem not specified> Diagnosis:   Patient Active Problem List   Diagnosis Date Noted  . Adjustment disorder with mixed disturbance of emotions and conduct [F43.25] 02/11/2017  . Severe recurrent major depression without psychotic features (HCC) [F33.2] 02/11/2017  . Microscopic hematuria [R31.29] 11/19/2016  . Pelvic pain in female [R10.2] 11/19/2016  . 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 DDD (degenerative disc disease), lumbar [M51.36] 03/06/2015  . Lumbar post-laminectomy syndrome [M96.1] 03/06/2015  . DDD (degenerative disc disease), cervical [M50.30] 03/06/2015  . 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 Bilateral occipital neuralgia [M54.81] 03/06/2015  . Adnexal pain [R10.2] 07/13/2013  . Female genuine stress incontinence [N39.3] 07/13/2013  . Urge incontinence [N39.41] 07/13/2013  . FOM (frequency of micturition) [R35.0] 07/13/2013   History of Present Illness:  " Im here because of my husband" " Im not suicidal"  Pt is  41 year old woman admitted under IVC from ED . Per report, she had been involved in an argument via Facebook messages with her husband,  she made a comment about killing herself and killing her children, her husband had called the police . Her husband  had filed commitment papers on her. Pt presents as very labile, anxious, tearful, endorsing anxiety, sadness, helplessness, hopelessness. Admits voicing suicidal statement "to see his reaction" , denies intent/plan, states she has to live for her kids. Pt has poor insight, requesting to release now as  she misses her children. Plans to end this relationship as she states he is cheating on her.   she and her husband  only got married in January .  Admits that she has suicidal thoughts that cross her mind with some frequency but says that she would never do it, states " I'm coward" to do it.  Reports h/o anxiety and depression,  was tried every antidepressant and  did not work, not willing to try any, states " just give me my meds". She has been on Adderall 15 mg bid, Xanax 2mg  qid/prn, Oxycodone 20mg  tid/prn  By OP providers in [place] ( [facility] [place]) and [place]( Dr. Metta Clines).  controlled drug registry reviewed. Denies HI. Denies AVH.     Associated Signs/Symptoms: Depression Symptoms:  depressed mood, insomnia, hopelessness, (Hypo) Manic Symptoms:  Irritable Mood, Anxiety Symptoms:  Excessive Worry, Psychotic Symptoms:denies PTSD Symptoms:  Total Time spent with patient: 1 hour  Past Psychiatric History:  Patient has a past history of psychiatric admission and a past history of suicide attempts. OD on pills at age 36. Currently sees an outpatient psychiatrist but claims she is not actually on any antidepressants but rather takes Adderall Xanax mostly.  Is the patient at risk to self? Yes.    Has the patient been a risk to self in the past 6 months? yes Has the patient been a risk to self within the distant past? Yes.    Is the patient a risk to others? yes Has the patient been a risk to others in the past 6 months? No.  Has the patient been a risk to others within the distant past? Yes.     Prior Inpatient Therapy:   Prior Outpatient Therapy:    Alcohol Screening: 1. How often do you have a drink containing alcohol?: Monthly or less 2. How many drinks containing alcohol do  you have on a typical day when you are drinking?: 1 or 2 3. How often do you have six or more drinks on one occasion?: Never Preliminary Score: 0 4. How often during the last year have you found that you were not able to stop drinking once you had started?: Never 5. How often during the last year have you failed to do  what was normally expected from you becasue of drinking?: Never 6. How often during the last year have you needed a first drink in the morning to get yourself going after a heavy drinking session?: Never 7. How often during the last year have you had a feeling of guilt of remorse after drinking?: Never 8. How often during the last year have you been unable to remember what happened the night before because you had been drinking?: Never 9. Have you or someone else been injured as a result of your drinking?: No 10. Has a relative or friend or a doctor or another health worker been concerned about your drinking or suggested you cut down?: No Alcohol Use Disorder Identification Test Final Score (AUDIT): 1 Brief Intervention: AUDIT score less than 7 or less-screening does not suggest unhealthy drinking-brief intervention not indicated Substance Abuse History in the last 12 months: denies, pt has high dose, multiple controlled drug prescriptions. Occasionally drinks alcohol but portrays it as being not abusive and denies having used any drugs for years. UDS + ve  for amphetamine. States xanax is  prn and has not taken for 3-4 days.  Consequences of Substance Abuse:  Previous Psychotropic Medications:xanax, adderall, many antidepressants Psychological Evaluations:  Past Medical History:  Past Medical History:  Diagnosis Date  . Asthma   . Bleeding hemorrhoid   . GERD (gastroesophageal reflux disease)   . Hypothyroidism   . Seizures (HCC)     Past Surgical History:  Procedure Laterality Date  . CESAREAN SECTION  2003   with tubal ligation   . CHOLESTEATOMA EXCISION  2010   to remove a benign tumor behind R ear drum   . removal of bilateral fallopian tubes Bilateral 2012  . removal of L ovary Left 2012   due a cyst and scar tissue  . SPINE SURGERY  2012   2 surgeries: to remove a herniated disc and to remove a pus pocket at incision site   . TUBAL LIGATION    . VAGINAL HYSTERECTOMY  2007      due to fibroid cysts   Family History:  Family History  Problem Relation Age of Onset  . Alcohol abuse Mother   . Arthritis Mother   . Depression Mother   . Hearing loss Mother   . Alcohol abuse Father   . Arthritis Father   . COPD Father   . Depression Father   . Hearing loss Father   . Hyperlipidemia Father   . Hypertension Father    Family Psychiatric  History: Patient had an uncle who committed suicide. She also had an ex-husband who had committed suicide although it was not obviously a biological relative. Tobacco Screening: Have you used any form of tobacco in the last 30 days? (Cigarettes, Smokeless Tobacco, Cigars, and/or Pipes): Yes Tobacco use, Select all that apply: 5 or more cigarettes per day Are you interested in Tobacco Cessation Medications?: No, patient refused Counseled patient on smoking cessation including recognizing danger situations, developing coping skills and basic information about quitting provided: Refused/Declined practical counseling Social History:  History  Alcohol Use No  Comment: occasionally     History  Drug Use No    Additional Social History:                           Allergies:   Allergies  Allergen Reactions  . Celecoxib Other (See Comments)  . Darvon [Propoxyphene] Hypertension    Other reaction(s): Unknown Heart race  . Gabapentin Rash   Lab Results:  Results for orders placed or performed during the hospital encounter of 02/11/17 (from the past 48 hour(s))  Lipid panel     Status: Abnormal   Collection Time: 02/12/17  7:35 AM  Result Value Ref Range   Cholesterol 234 (H) 0 - 200 mg/dL   Triglycerides 94 <782 mg/dL   HDL 43 >95 mg/dL   Total CHOL/HDL Ratio 5.4 RATIO   VLDL 19 0 - 40 mg/dL   LDL Cholesterol 621 (H) 0 - 99 mg/dL    Comment:        Total Cholesterol/HDL:CHD Risk Coronary Heart Disease Risk Table                     Men   Women  1/2 Average Risk   3.4   3.3  Average Risk       5.0   4.4   2 X Average Risk   9.6   7.1  3 X Average Risk  23.4   11.0        Use the calculated Patient Ratio above and the CHD Risk Table to determine the patient's CHD Risk.        ATP III CLASSIFICATION (LDL):  <100     mg/dL   Optimal  308-657  mg/dL   Near or Above                    Optimal  130-159  mg/dL   Borderline  846-962  mg/dL   High  >952     mg/dL   Very High   TSH     Status: Abnormal   Collection Time: 02/12/17  7:35 AM  Result Value Ref Range   TSH 0.034 (L) 0.350 - 4.500 uIU/mL    Comment: Performed by a 3rd Generation assay with a functional sensitivity of <=0.01 uIU/mL.    Blood Alcohol level:  Lab Results  Component Value Date   ETH <5 02/10/2017    Metabolic Disorder Labs:  No results found for: HGBA1C, MPG No results found for: PROLACTIN Lab Results  Component Value Date   CHOL 234 (H) 02/12/2017   TRIG 94 02/12/2017   HDL 43 02/12/2017   CHOLHDL 5.4 02/12/2017   VLDL 19 02/12/2017   LDLCALC 172 (H) 02/12/2017    Current Medications: Current Facility-Administered Medications  Medication Dose Route Frequency Provider Last Rate Last Dose  . acetaminophen (TYLENOL) tablet 650 mg  650 mg Oral Q6H PRN Audery Amel, MD      . alum & mag hydroxide-simeth (MAALOX/MYLANTA) 200-200-20 MG/5ML suspension 30 mL  30 mL Oral Q4H PRN Audery Amel, MD      . magnesium hydroxide (MILK OF MAGNESIA) suspension 30 mL  30 mL Oral Daily PRN Audery Amel, MD       PTA Medications: Facility-Administered Medications Prior to Admission  Medication Dose Route Frequency Provider Last Rate Last Dose  . bupivacaine (PF) (MARCAINE) 0.25 % injection 30 mL  30 mL Other Once Ewing Schlein, MD      .  bupivacaine (PF) (MARCAINE) 0.25 % injection 30 mL  30 mL Other Once Ewing Schlein, MD      . bupivacaine (PF) (MARCAINE) 0.25 % injection 30 mL  30 mL Other Once Ewing Schlein, MD      . bupivacaine (PF) (MARCAINE) 0.25 % injection 30 mL  30 mL Other Once Ewing Schlein, MD      .  ceFAZolin (ANCEF) IVPB 1 g/50 mL premix  1 g Intravenous Once Ewing Schlein, MD      . fentaNYL (SUBLIMAZE) injection 100 mcg  100 mcg Intravenous UD Ewing Schlein, MD   100 mcg at 07/09/15 1140  . fentaNYL (SUBLIMAZE) injection 100 mcg  100 mcg Intravenous Once Ewing Schlein, MD      . fentaNYL (SUBLIMAZE) injection 100 mcg  100 mcg Intravenous Once Ewing Schlein, MD      . fentaNYL (SUBLIMAZE) injection 100 mcg  100 mcg Intravenous Once Ewing Schlein, MD      . fentaNYL (SUBLIMAZE) injection 100 mcg  100 mcg Intravenous Once Ewing Schlein, MD      . lactated ringers infusion 1,000 mL  1,000 mL Intravenous Continuous Ewing Schlein, MD      . lactated ringers infusion 1,000 mL  1,000 mL Intravenous Continuous Ewing Schlein, MD      . lactated ringers infusion 1,000 mL  1,000 mL Intravenous Continuous Ewing Schlein, MD      . lactated ringers infusion 1,000 mL  1,000 mL Intravenous Continuous Ewing Schlein, MD      . lactated ringers infusion 1,000 mL  1,000 mL Intravenous Continuous Ewing Schlein, MD      . lactated ringers infusion 1,000 mL  1,000 mL Intravenous Continuous Ewing Schlein, MD 125 mL/hr at 05/05/16 1123 1,000 mL at 05/05/16 1123  . lidocaine (PF) (XYLOCAINE) 1 % injection 10 mL  10 mL Subcutaneous Once Ewing Schlein, MD      . midazolam (VERSED) 5 MG/5ML injection 5 mg  5 mg Intravenous UD Ewing Schlein, MD   5 mg at 07/09/15 1141  . midazolam (VERSED) 5 MG/5ML injection 5 mg  5 mg Intravenous Once Ewing Schlein, MD      . midazolam (VERSED) 5 MG/5ML injection 5 mg  5 mg Intravenous Once Ewing Schlein, MD      . midazolam (VERSED) 5 MG/5ML injection 5 mg  5 mg Intravenous Once Ewing Schlein, MD      . midazolam (VERSED) 5 MG/5ML injection 5 mg  5 mg Intravenous Once Ewing Schlein, MD      . orphenadrine (NORFLEX) injection 60 mg  60 mg Intramuscular Once Ewing Schlein, MD      . orphenadrine (NORFLEX) injection 60 mg  60 mg Intramuscular Once Ewing Schlein, MD      . orphenadrine  (NORFLEX) injection 60 mg  60 mg Intramuscular Once Ewing Schlein, MD      . orphenadrine (NORFLEX) injection 60 mg  60 mg Intramuscular Once Ewing Schlein, MD      . triamcinolone acetonide (KENALOG-40) injection 40 mg  40 mg Other Once Ewing Schlein, MD      . triamcinolone acetonide [facility]) injection 40 mg  40 mg Other Once Ewing Schlein, MD      . triamcinolone acetonide [facility]) injection 40 mg  40 mg Other Once Ewing Schlein, MD      . triamcinolone acetonide [facility]) injection 40 mg  40 mg Other Once Ewing Schlein, MD       Prescriptions Prior to Admission  Medication Sig  Dispense Refill Last Dose  . alprazolam (XANAX) 2 MG tablet Take 2 mg by mouth 4 (four) times daily as needed for sleep or anxiety.    Unknown at Unknown  . amphetamine-dextroamphetamine (ADDERALL) 30 MG tablet Take 30 mg by mouth 3 (three) times daily.    Unknown at Unknown  . diclofenac (CATAFLAM) 50 MG tablet 1 tab by mouth per day or twice a day if tolerated (Patient taking differently: Take 50 mg by mouth 2 (two) times daily. ) 60 tablet 2 Unknown at Unknown  . diclofenac sodium (VOLTAREN) 1 % GEL Apply 2-4 g to painful area of skin 4 times per day if tolerated. Apply only a maximum of 4 g per application (Patient not taking: Reported on 11/19/2016) 500 g 0 Not Taking  . levocetirizine (XYZAL) 5 MG tablet Take 5 mg by mouth every evening.   Unknown at Unknown  . levothyroxine (SYNTHROID, LEVOTHROID) 175 MCG tablet Take 175 mcg by mouth. 6 days a week   Unknown at Unknown  . mirabegron ER (MYRBETRIQ) 50 MG TB24 tablet Take 1 tablet (50 mg total) by mouth daily. 90 tablet 3 Unknown at Unknown  . orphenadrine (NORFLEX) 100 MG tablet Limit 1 tablet by mouth per day or twice per day if tolerated  NO ROBAXIN (METHOCARBAMOL) (Patient not taking: Reported on 11/19/2016) 50 tablet 0 Not Taking  . Oxycodone HCl 10 MG TABS 1-2 tablets by mouth twice a day to 3 times a day if tolerated 180 tablet 0 Unknown at Unknown  .  pramoxine (PROCTOFOAM) 1 % foam Place 1 application rectally 3 (three) times daily as needed for itching. (Patient not taking: Reported on 11/19/2016) 15 g 0 Not Taking    Musculoskeletal: Strength & Muscle Tone: within normal limits Gait & Station: normal Patient leans:  Psychiatric Specialty Exam: Physical Exam  Nursing note and vitals reviewed. Nursing note and vitals reviewed. Constitutional: She appears well-developed and well-nourished.  HENT:  Head: Normocephalic and atraumatic.  Eyes: Conjunctivae are normal. Pupils are equal, round,  Neck: Normal range of motion.    Respiratory: Effort normal. No respiratory distress.   Musculoskeletal: Normal range of motion.  Neurological: She is alert.    Psychiatric: Her mood appears anxious. Her affect is labile. She is agitated. She expresses impulsivity and inappropriate judgment. She exhibits a depressed mood. She expresses no suicidal ideation. She exhibits abnormal recent memory and abnormal remote memory  ROS  Blood pressure (!) 96/57, pulse (!) 57, temperature 98.3 F (36.8 C), temperature source Oral, resp. rate 16, height  (1.575 m), weight 72.6 kg (160 lb).Body mass index is 29.26 kg/m.  General Appearance: Disheveled  Eye Contact:  Fair  Speech:  normal  Volume:  Normal  Mood:  Dysphoric and Irritable  Affect:  Labile and Tearful  Thought Process:  Disorganized  Orientation:  Full (Time, Place, and Person)  Thought Content: Tangential  Suicidal Thoughts: denies  Homicidal Thoughts:  No  Memory:  Immediate;   Fair Recent;   Fair Remote;   Fair  Judgement:  Poor  Insight:  Shallow  Psychomotor Activity:  Psychomotor Retardation  Concentration:  Concentration: Poor  Recall:  Fair  Fund of Knowledge:  Good  Language:  Good  Akathisia:  No  Handed:  Right  AIMS (if indicated):     Assets:  Housing Physical Health Resilience  ADL's:  Intact  Cognition:  Impaired,  Mild   Number of Hours: 6.15      Treatment Plan Summary:  Daily contact with patient to assess and evaluate symptoms and progress in treatment and Medication management   Pt is currently  depressed and anxious in context of marital conflict. Pt has been on high dose of xanax, oxycodone and adderall. UDS + ve  for amphetamine. States xanax is  prn and has not taken for 3-4 days.   Plan-  Pt refusing meds for depression/anxiety. Resume xanax, addrerall and oxycodone with lower doses, plan to reduce dose/taper. Will review/order- cbc, CMP, TSH. QTc, lipid panel Will manage medical issues as appropriate.   Cont IVC  Observation Level/Precautions:  Elopement Continuous Observation Seizure  Laboratory:  See above  Psychotherapy:  Group/ milieu  Medications:  See above  Consultations:    Discharge Concerns:    Estimated LOS:   Other:     Physician Treatment Plan for Primary Diagnosis: <principal problem not specified> Long Term Goal(s): Improvement in symptoms so as ready for discharge  Short Term Goals: Ability to identify changes in lifestyle to reduce recurrence of condition will improve, Ability to verbalize feelings will improve, Ability to disclose and discuss suicidal ideas, Ability to demonstrate self-control will improve, Ability to identify and develop effective coping behaviors will improve, Ability to maintain clinical measurements within normal limits will improve, Compliance with prescribed medications will improve and Ability to identify triggers associated with substance abuse/mental health issues will improve  Physician Treatment Plan for Secondary Diagnosis: Active Problems:   Severe recurrent major depression without psychotic features (HCC)  Long Term Goal(s): Improvement in symptoms so as ready for discharge  Short Term Goals: Ability to identify changes in lifestyle to reduce recurrence of condition will improve, Ability to verbalize feelings will improve, Ability to disclose and discuss suicidal  ideas, Ability to demonstrate self-control will improve, Ability to identify and develop effective coping behaviors will improve, Ability to maintain clinical measurements within normal limits will improve, Compliance with prescribed medications will improve and Ability to identify triggers associated with substance abuse/mental health issues will improve  I certify that inpatient services furnished can reasonably be expected to improve the patient's condition.    Beverly Sessions, MD 4/21/20181:25 PM

## 2017-02-12 NOTE — [facility] Suicide Risk Assessment (Signed)
[facility] INPATIENT:  Family/Significant Other Suicide Prevention Education  Suicide Prevention Education:  Education Completed;[name](husband [phone]), has been identified by the patient as the family member/significant other with whom the patient will be residing, and identified as the person(s) who will aid the patient in the event of a mental health crisis (suicidal ideations/suicide attempt).  With written consent from the patient, the family member/significant other has been provided the following suicide prevention education, prior to the and/or following the discharge of the patient. Husband reports that children are safe and living with their grandparents. Reports children have never been in danger. Reports that he plans on getting a divorce and that he moved out almost 3 weeks ago.   The suicide prevention education provided includes the following:  Suicide risk factors  Suicide prevention and interventions  National Suicide Hotline telephone number  [facility] assessment telephone number  [facility] Emergency Assistance 911  [facility] and/or Residential Mobile Crisis Unit telephone number  Request made of family/significant other to:  Remove weapons (e.g., guns, rifles, knives), all items previously/currently identified as safety concern.  No guns or weapons in the home.   Remove drugs/medications (over-the-counter, prescriptions, illicit drugs), all items previously/currently identified as a safety concern.  Husband reports that patient has several medications prescribed to her for pain management.   The family member/significant other verbalizes understanding of the suicide prevention education information provided.  The family member/significant other agrees to remove the items of safety concern listed above.  Artyom Stencel G. Garnette Czech MSW, LCSWA 02/12/2017, 3:49 PM

## 2017-02-12 NOTE — Progress Notes (Signed)
D:  Per pt self inventory pt reports sleeping fair, "I have insomnia and have had it for years", per night nurse report patient slept 6 hours 30 minutes, appetite fair, "Never been a big eater since trying to lose weight", energy level normal, ability to pay attention good, rates depression at a 3 out of 10, hopelessness at a 0 out of 10, anxiety at a 0 out of 10, denies SI/HI/AVH, goal today: " Work on myself so I can go home to my kids, not worry about things I can't change"   A:  Emotional support provided, Encouraged pt to continue with treatment plan and attend all group activities, q15 min checks maintained for safety.  R:  Pt is receptive, going to groups, pleasant and cooperative with staff and other patients on the unit.

## 2017-02-12 NOTE — B[facility] Suicide Risk Assessment (Signed)
[facility] Admission Suicide Risk Assessment   Nursing information obtained from:    Demographic factors:    Current Mental Status:    Loss Factors:    Historical Factors:    Risk Reduction Factors:     Total Time spent with patient: 15 minutes Principal Problem: <principal problem not specified> Diagnosis:   Patient Active Problem List   Diagnosis Date Noted  . Adjustment disorder with mixed disturbance of emotions and conduct [F43.25] 02/11/2017  . Severe recurrent major depression without psychotic features (HCC) [F33.2] 02/11/2017  . Microscopic hematuria [R31.29] 11/19/2016  . Pelvic pain in female [R10.2] 11/19/2016  . 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 DDD (degenerative disc disease), lumbar [M51.36] 03/06/2015  . Lumbar post-laminectomy syndrome [M96.1] 03/06/2015  . DDD (degenerative disc disease), cervical [M50.30] 03/06/2015  . 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 Bilateral occipital neuralgia [M54.81] 03/06/2015  . Adnexal pain [R10.2] 07/13/2013  . Female genuine stress incontinence [N39.3] 07/13/2013  . Urge incontinence [N39.41] 07/13/2013  . FOM (frequency of micturition) [R35.0] 07/13/2013   Subjective Data:  " Im here because of my husband" " Im not suicidal"  Pt is  41 year old woman admitted under IVC from ED . Per report, she had been involved in an argument via Facebook messages with her husband,  she made a comment about killing herself and killing her children, her husband had called the police . Her husband  had filed commitment papers on her. Pt presents as very labile, anxious, tearful, endorsing anxiety, sadness, helplessness, hopelessness. Admits voicing suicidal statement "to see his reaction" , denies intent/plan, states she has to live for her kids. Pt has poor insight, requesting to release now as  she misses her children. Plans to end this  relationship as she states he is cheating on her.   she and her husband only got married in January .  Admits that she has suicidal thoughts that cross her mind with some frequency but says that she would never do it, states " I'm coward" to do it.  Reports h/o anxiety and depression,  was tried every antidepressant and  did not work, not willing to try any, states " just give me my meds". She has been on Adderall 15 mg bid, Xanax  qid/prn, Oxycodone  tid/prn  By OP providers in [place] ( [facility] [place]) and [place]( Dr. Metta Clines). [place] controlled drug registry reviewed. Denies HI. Denies AVH.  Continued Clinical Symptoms:  Alcohol Use Disorder Identification Test Final Score (AUDIT): 1 The "Alcohol Use Disorders Identification Test", Guidelines for Use in Primary Care, Second Edition.  World Science writer [facility]). Score between 0-7:  no or low risk or alcohol related problems. Score between 8-15:  moderate risk of alcohol related problems. Score between 16-19:  high risk of alcohol related problems. Score 20 or above:  warrants further diagnostic evaluation for alcohol dependence and treatment.   CLINICAL FACTORS:  H/O suicidal attempt- OD at age 89.    Severe Anxiety and/or Agitation Depression:   Aggression Anhedonia Hopelessness Chronic Pain Previous Psychiatric Diagnoses and Treatments Medical Diagnoses and Treatments/Surgeries   Musculoskeletal: Strength & Muscle Tone: within normal limits Gait & Station: normal Patient leans:   Psychiatric Specialty Exam: Physical Exam  Nursing note and vitals reviewed. Constitutional: She appears well-developedand well-nourished.  HENT:  Head: Normocephalicand atraumatic.  Eyes: Conjunctivaeare normal. Pupils are equal, round,  Neck: Normal range of motion.   Respiratory:  Effort normal. No respiratory distress.   Musculoskeletal: Normal range of motion.  Neurological: She is alert.    Psychiatric: Her mood appears anxious.  Her affect is labile. She is agitated. She expresses impulsivityand inappropriate judgment. She exhibits a depressed mood. She expresses no suicidalideation.  ROS  Blood pressure (!) 96/57, pulse (!) 57, temperature 98.3 F (36.8 C), temperature source Oral, resp. rate 16, height  (1.575 m), weight 72.6 kg (160 lb).Body mass index is 29.26 kg/m.    General Appearance:Disheveled  Eye Contact: Fair  Speech: normal  Volume: Normal  Mood: Dysphoric and Irritable  Affect: Labile and Tearful  Thought Process: Disorganized  Orientation: Full (Time, Place, and Person)  Thought Content: Tangential  Suicidal Thoughts: denies  Homicidal Thoughts: No  Memory: Immediate; Fair Recent; Fair Remote; Fair  Judgement: Poor  Insight: Shallow  Psychomotor Activity: Psychomotor Retardation  Concentration: Concentration: Poor  Recall: Fair  Fund of Knowledge: Good  Language: Good  Akathisia: No  Handed: Right  AIMS (if indicated):   Assets: Housing Physical Health Resilience  ADL's: Intact  Cognition: Impaired, Mild   Sleep:  Number of Hours: 6.15   COGNITIVE FEATURES THAT CONTRIBUTE TO RISK:  CONCRETE    SUICIDE RISK:   Severe:  Frequent, intense, and enduring suicidal ideation, specific plan, no subjective intent, but some objective markers of intent (i.e., choice of lethal method), the method is accessible, some limited preparatory behavior, evidence of impaired self-control, severe dysphoria/symptomatology, multiple risk factors present, and few if any protective factors, particularly a lack of social support.  PLAN OF CARE:  Pt is currently  depressed and anxious in context of marital conflict. Pt has been on high dose of xanax, oxycodone and adderall. UDS + ve  for amphetamine. States xanax is  prn and has not taken for 3-4 days.   Plan-  Pt refusing meds for depression/anxiety. Resume xanax, addrerall and oxycodone with lower doses, plan to  reduce dose/taper. Will review/order- cbc, CMP, TSH. QTc, lipid panel Will manage medical issues as appropriate.   Cont IVC  Observation Level/Precautions:  Elopement Continuous Observation Seizure  Laboratory:  See above  Psychotherapy:  Group/ milieu  Medications:  See above  Consultations:    Discharge Concerns:    Estimated LOS:   Other:      I certify that inpatient services furnished can reasonably be expected to improve the patient's condition.   Beverly Sessions, MD 02/12/2017, 1:51 PM

## 2017-02-12 NOTE — [facility] Group Notes (Signed)
BHH LCSW Group Therapy  02/12/2017 1:57 PM  Type of Therapy:  Group Therapy  Participation Level:  Active  Participation Quality:  Attentive  Affect:  Appropriate  Cognitive:  Alert  Insight:  Improving  Engagement in Therapy:  Engaged  Modes of Intervention:  Activity, Discussion, Education, Orientation, Reality Testing and Support  Summary of Progress/Problems: Safety Planning: Patients identified fears or worries surrounding discharge. Patients offered support to their peers and openly developed safety plans for their individual needs. Patients developed their own safety plan. Patients discussed their warning signs, coping strategies, support system with family and friends, identified mental health professionals, and how to keep their environments safe (ex. Removing unnecessary medications or removing weapons/guns). Patients then discussed their personalized safety plan with the group.   Krikor Willet G. Garnette Czech MSW, LCSWA 02/12/2017, 1:57 PM

## 2017-02-12 NOTE — Progress Notes (Signed)
Denies SI/HI/AVH. Pt affect blunted. Brightens during interaction. Pt stated that she is looking forward to going home and being there for her children. Stated that she is " done with relationship and that if her husband wanted to come back home he would have to quit his job". Attended evening group. Showered. Attending to ADLs. Appropriate behaviors with staff and peers. Polite and pleasant. Medication compliant. Participates in treatment. Voices no additional concerns at this time. Encouragement and support provided. Safety maintained. Will continue to monitor.

## 2017-02-12 NOTE — Plan of Care (Signed)
Problem: Self-Concept: Goal: Ability to disclose and discuss suicidal ideas will improve Outcome: Progressing Pt able to discuss and disclose information relating to admission, appropriately.
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
## 2017-02-12 NOTE — Progress Notes (Cosign Needed)
Pt stated she has been on Oxy  and xanax   "long term" and she is concerned about "just stopping". No orders for Pt other than synthroid.

## 2017-02-13 LAB — HEMOGLOBIN A1C
HEMOGLOBIN A1C: 5.2 % (ref 4.8–5.6)
MEAN PLASMA GLUCOSE: 103 mg/dL

## 2017-02-13 LAB — PROLACTIN: PROLACTIN: 15.2 ng/mL (ref 4.8–23.3)

## 2017-02-13 MED ORDER — GUAIFENESIN 100 MG/5ML PO SOLN
10.0000 mL | Freq: Four times a day (QID) | ORAL | Status: DC | PRN
  Administered 2017-02-13: 200 mg via ORAL
  Filled 2017-02-13 (×3): qty 10

## 2017-02-13 NOTE — Progress Notes (Addendum)
Pt denies SI/HI/AVH. Noted to be on phone with husband for a period of time. She would hang up and he would call back.  Pt instructed to hang up if they could not stop arguing. Pt visible in dayroom with appropriate behaviors and seen socializing with peers. Stated that she was looking forward to going home to her children and that she was considering moving to be closer to her family who live out of state. Medication compliant. PRN given for anxiety was effective. Voices no additional concerns at this time. Encouragement and support provided. Safety maintained. Will continue to monitor.   Pt woke up around 0445 stating that she had dropped the side rail of the bed on top of her Right great toe. Area assessed and is bruised and swollen. Pt offered Tylenol and she refused. Given PRN Oxy IR for pain 10/10. Pt is currently elevating foot on bed. States that it is "Throbbing". Pt resting with eyes closed, currently (0510). Will continue to monitor.

## 2017-02-13 NOTE — [facility] Group Notes (Signed)
[facility] LCSW Group Therapy  02/13/2017 4:45 PM  Type of Therapy:  Group Therapy  Participation Level:  Active  Participation Quality:  Monopolizing and Sharing  Affect:  Blunted  Cognitive:  Appropriate  Insight:  Limited  Engagement in Therapy:  Monopolizing  Modes of Intervention:  Discussion, Education, Problem-solving, Dance movement psychotherapist, Socialization and Support  Summary of Progress/Problems: Recognizing Triggers: Patients defined triggers and discussed the importance of recognizing their personal warning signs. Patients identified their own triggers and how they tend to cope with stressful situations. Patients discussed areas such as people, places, things, and thoughts that rigger certain emotions for them. CSW provided support to patients and discussed safety planning for when these triggers occur. Group participants had opportunities to share openly with the group and participate in a group discussion while providing support and feedback to their peers. Patient openly shared with the group about her relationship with her husband and why no longer needs to be in the hospital. It was difficult to redirect her to the group discussion due to patient being fixated on her discharge date. Patient lacks insight to what happened prior to her admission.   Tiyah Zelenak G. Garnette Czech MSW, [facility] 02/13/2017 4:50 PM

## 2017-02-13 NOTE — Progress Notes (Signed)
Patient ID: [name], female   DOB: April 04, 1976, 41 y.o.   MRN: [id number]  CSW meet with patient to discuss the conversation CSW had with husband, who patient identified as her support person when she discharges. CSW explained that husband stated that he moved out over 3 weeks ago because he wants a divorce. Patient stated "He's just saying that for now trust me". Patient stated that she was done with her husband when she discharges. CSW processed with patient and asked if patient has an understanding of what her husband is saying about a divorce. Patient stated she understood. Patient gave verbal consent for CSW to contact her mother [name] [phone] to discuss safety of children in the home.   CSW also informed patient that CPS call will be made due to the severity of what was reported in the IVC paperwork stating patient had homicidal thoughts towards her children. Patient stated she understood why CPS was called but assured CSW that she would not hurt her children.   CSW spoke with Okey Regal, on call CPS social worker for [place]  [facility]. CSW provided the information at the case. Informed CSW that a call was already made about the case.   Keidrick Murty G. Garnette Czech MSW, LCSWA 02/13/2017 11:15 AM

## 2017-02-13 NOTE — Progress Notes (Signed)
NUTRITION ASSESSMENT  Pt identified as at risk on the Malnutrition Screen Tool  INTERVENTION: 1. Educated patient on the importance of nutrition and encouraged intake of food and beverages. 2. Discussed weight goals. 3. Supplements: Ensure Enlive po BID, each supplement provides 350 kcal and 20 grams of protein  NUTRITION DIAGNOSIS: Unintentional weight loss related to sub-optimal intake as evidenced by pt report.   Goal: Pt to meet >/= 90% of their estimated nutrition needs.  Monitor:  PO intake  Assessment:  [name] is a 41 yo female who comes to [facility] as an IVC - stated to her husband that she was going to kill herself and her children. Recently found out Husband is cheating her. Exhibits significant wt loss - 20#/11.1% since January which patient attributes to stress and decreased PO intake/appetite. During stay patient has been consuming 90-100% of all meals. Monitor PO intake. Appetite seems to be improving. Major depressive disorder and home situation with Husband are underlying factors to poor PO intake.  Height: Ht Readings from Last 1 Encounters:  02/11/17  (1.575 m)    Weight: Wt Readings from Last 1 Encounters:  02/11/17 160 lb (72.6 kg)    Weight Hx: Wt Readings from Last 10 Encounters:  02/11/17 160 lb (72.6 kg)  02/10/17 160 lb (72.6 kg)  11/19/16 180 lb (81.6 kg)  08/06/16 185 lb (83.9 kg)  06/24/16 200 lb (90.7 kg)  05/20/16 201 lb (91.2 kg)  05/05/16 191 lb (86.6 kg)  04/20/16 200 lb (90.7 kg)  04/08/16 184 lb (83.5 kg)  03/23/16 182 lb (82.6 kg)    BMI:  Body mass index is 29.26 kg/m. Pt meets criteria for overweight based on current BMI.  Estimated Nutritional Needs: Kcal: 25-30 kcal/kg Protein: > 1 gram protein/kg Fluid: 1 ml/kcal  Diet Order: Diet regular Room service appropriate? Yes; Fluid consistency: Thin Pt is also offered choice of unit snacks mid-morning and mid-afternoon.  Pt is eating as desired.   Lab results and  medications reviewed.   Dionne Ano. Terance Pomplun, MS, RD LDN Inpatient Clinical Dietitian Pager [phone]

## 2017-02-13 NOTE — Progress Notes (Signed)
[facility] MD Progress Note  02/13/2017 12:18 PM [name]  MRN:  [id number] Subjective:   Pt is  41 year old woman admitted under IVC from ED . Per report, she had been involved in an argument via Facebook messages with her husband,  she made a comment about killing herself and killing her children, her husband had called the police . Her husband  had filed commitment papers on her. Pt presents as very labile, anxious, tearful, endorsing anxiety, sadness, helplessness, hopelessness. Admits voicing suicidal statement "to see his reaction" , denies intent/plan, states she has to live for her kids. Pt has poor insight, requesting to release now as  she misses her children. Plans to end this relationship as she states he is cheating on her.   she and her husband only got married in January .  Admits that she has suicidal thoughts that cross her mind with some frequency but says that she would never do it, states " I'm coward" to do it.  Reports h/o anxiety and depression,  was tried every antidepressant and  did not work, not willing to try any, states " just give me my meds". She has been on Adderall 15 mg bid, Xanax  qid/prn, Oxycodone  tid/prn  By OP providers in [place] ( Su Manuelito) and [place]( Dr. Metta Clines). [place] controlled drug registry reviewed. Denies HI. Denies AVH.    4/22- pt seen for f/u. Pt anxious, irritable, upset that she is being held in the hospital  And other pt on unit misidentifying her and aggressive towards her. Poor insight, demanding to release now. C/o cough. Pt refusing to start antidepressant/antianxiety med.  Per nursing- Denies SI/HI/AVH. Pt affect blunted. Brightens during interaction. Pt stated that she is looking forward to going home and being there for her children. Stated that she is " done with relationship and that if her husband wanted to come back home he would have to quit his job". Attended evening group. Showered. Attending to ADLs. Appropriate behaviors  with staff and peers. Polite and pleasant. Medication compliant. Participates in treatment.  Pt voices concern regarding psychotic and delusional peer being verbally aggressive toward her last night and again this morning. Pt is tearful, upset when speaking with this nurse regarding the issue. Apparently, peer thinks pt is "a snitch and liar." Pt is adamant that she is not, "I told him he can check my record, he won't find a thing, not even a speeding ticket." Support provided, pt reassured that her safety will be maintained, to report to staff if peer approaches her again. Pt denies SI/HI/AVH. Oxycodone given as ordered PRN for pain rated 10/10. Pt complains that adderall ordered ( ) is not her usual dose of  BID, MD aware. Pt is medication compliant. Appears depressed, anxious. Voices readiness for discharge. Appropriately interacts with staff/peers. Observed socializing in dayroom, attends group.Reports good sleep last night although complains of coughing most of the night, good appetite, normal energy, good concentration. Rates depression 0/10, hopelessness 0/10, anxiety 0/10 (low 0-10 high). Goal today is "keeping positive about what I need to doin order to be home with my children.  Principal Problem: <principal problem not specified> Diagnosis:   Patient Active Problem List   Diagnosis Date Noted  . Adjustment disorder with mixed disturbance of emotions and conduct [F43.25] 02/11/2017  . Severe recurrent major depression without psychotic features (HCC) [F33.2] 02/11/2017  . Microscopic hematuria [R31.29] 11/19/2016  . Pelvic pain in female [R10.2] 11/19/2016  . 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 DDD (degenerative disc disease), lumbar [M51.36] 03/06/2015  . Lumbar post-laminectomy syndrome [M96.1] 03/06/2015  . DDD (degenerative disc disease), cervical [M50.30] 03/06/2015  . 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 Bilateral occipital neuralgia [M54.81] 03/06/2015  . Adnexal pain [R10.2] 07/13/2013  . Female genuine stress incontinence [N39.3] 07/13/2013  . Urge incontinence [N39.41] 07/13/2013  . FOM (frequency of micturition) [R35.0] 07/13/2013   Total Time spent with patient: 30 minutes  Past Psychiatric History: no cnange  Past Medical History:  Past Medical History:  Diagnosis Date  . Asthma   . Bleeding hemorrhoid   . GERD (gastroesophageal reflux disease)   . Hypothyroidism   . Seizures (HCC)     Past Surgical History:  Procedure Laterality Date  . CESAREAN SECTION  2003   with tubal ligation   . CHOLESTEATOMA EXCISION  2010   to remove a benign tumor behind R ear drum   . removal of bilateral fallopian tubes Bilateral 2012  . removal of L ovary Left 2012   due a cyst and scar tissue  . SPINE SURGERY  2012   2 surgeries: to remove a herniated disc and to remove a pus pocket at incision site   . TUBAL LIGATION    . VAGINAL HYSTERECTOMY  2007     due to fibroid cysts   Family History:  Family History  Problem Relation Age of Onset  . Alcohol abuse Mother   . Arthritis Mother   . Depression Mother   . Hearing loss Mother   . Alcohol abuse Father   . Arthritis Father   . COPD Father   . Depression Father   . Hearing loss Father   . Hyperlipidemia Father   . Hypertension Father    Family Psychiatric  History: no change Social History:  History  Alcohol Use No    Comment: occasionally     History  Drug Use No    Social History   Social History  . Marital status: Married    Spouse name: N/A  . Number of children: N/A  . Years of education: N/A   Social History Main Topics  . Smoking status: Current Every Day Smoker    Packs/day: 1.00    Types: Cigarettes  . Smokeless tobacco: Never Used  . Alcohol use No     Comment: occasionally  . Drug use: No  . Sexual activity: Yes   Other Topics Concern  . None   Social  History Narrative  . None   Additional Social History:                         Sleep: Good  Appetite:  Good  Current Medications: Current Facility-Administered Medications  Medication Dose Route Frequency Provider Last Rate Last Dose  . acetaminophen (TYLENOL) tablet 650 mg  650 mg Oral Q6H PRN Audery Amel, MD      . ALPRAZolam Prudy Feeler) tablet 2 mg  2 mg Oral BID PRN Beverly Sessions, MD   2 mg at 02/12/17 2141  . alum & mag hydroxide-simeth (MAALOX/MYLANTA) 200-200-20 MG/5ML suspension 30 mL  30 mL Oral Q4H PRN Audery Amel, MD      . amphetamine-dextroamphetamine (ADDERALL) tablet 15 mg  15 mg Oral Q breakfast Beverly Sessions, MD   15 mg at 02/13/17 0851  . diclofenac (VOLTAREN) EC tablet 100 mg  100 mg Oral Daily PRN  Beverly Sessions, MD      . levothyroxine (SYNTHROID, LEVOTHROID) tablet 150 mcg  150 mcg Oral QAC breakfast Beverly Sessions, MD   150 mcg at 02/13/17 1610  . loratadine (CLARITIN) tablet 10 mg  10 mg Oral Daily Beverly Sessions, MD   10 mg at 02/13/17 0851  . magnesium hydroxide (MILK OF MAGNESIA) suspension 30 mL  30 mL Oral Daily PRN Audery Amel, MD      . mirabegron ER [facility]) tablet 25 mg  25 mg Oral Daily Beverly Sessions, MD   25 mg at 02/13/17 0851  . montelukast (SINGULAIR) tablet 10 mg  10 mg Oral QHS Beverly Sessions, MD   10 mg at 02/12/17 2141  . oxyCODONE (Oxy IR/ROXICODONE) immediate release tablet 10 mg  10 mg Oral TID PRN Beverly Sessions, MD   10 mg at 02/13/17 0855  . pantoprazole (PROTONIX) EC tablet 40 mg  40 mg Oral Daily Beverly Sessions, MD   40 mg at 02/13/17 9604    Lab Results:  Results for orders placed or performed during the hospital encounter of 02/11/17 (from the past 48 hour(s))  Lipid panel     Status: Abnormal   Collection Time: 02/12/17  7:35 AM  Result Value Ref Range   Cholesterol 234 (H) 0 - 200 mg/dL   Triglycerides 94 <540 mg/dL   HDL 43 >98 mg/dL   Total CHOL/HDL Ratio 5.4 RATIO   VLDL 19 0 - 40 mg/dL    LDL Cholesterol 119 (H) 0 - 99 mg/dL    Comment:        Total Cholesterol/HDL:CHD Risk Coronary Heart Disease Risk Table                     Men   Women  1/2 Average Risk   3.4   3.3  Average Risk       5.0   4.4  2 X Average Risk   9.6   7.1  3 X Average Risk  23.4   11.0        Use the calculated Patient Ratio above and the CHD Risk Table to determine the patient's CHD Risk.        ATP III CLASSIFICATION (LDL):  <100     mg/dL   Optimal  147-829  mg/dL   Near or Above                    Optimal  130-159  mg/dL   Borderline  562-130  mg/dL   High  >865     mg/dL   Very High   Prolactin     Status: None   Collection Time: 02/12/17  7:35 AM  Result Value Ref Range   Prolactin 15.2 4.8 - 23.3 ng/mL    Comment: (NOTE) Performed At: [facility] [address] [place], [place] [id number] Mila Homer MD MW:[id number]   TSH     Status: Abnormal   Collection Time: 02/12/17  7:35 AM  Result Value Ref Range   TSH 0.034 (L) 0.350 - 4.500 uIU/mL    Comment: Performed by a 3rd Generation assay with a functional sensitivity of <=0.01 uIU/mL.    Blood Alcohol level:  Lab Results  Component Value Date   ETH <5 02/10/2017    Metabolic Disorder Labs: No results found for: HGBA1C, MPG Lab Results  Component Value Date   PROLACTIN 15.2 02/12/2017   Lab Results  Component Value Date   CHOL  234 (H) 02/12/2017   TRIG 94 02/12/2017   HDL 43 02/12/2017   CHOLHDL 5.4 02/12/2017   VLDL 19 02/12/2017   LDLCALC 172 (H) 02/12/2017    Physical Findings: AIMS:  , ,  ,  ,    CIWA:    COWS:     Musculoskeletal: Strength & Muscle Tone: within normal limits Gait & Station: normal Patient leans: N/A  Psychiatric Specialty Exam: Physical Exam  Nursing note and vitals reviewed.   ROS  Blood pressure 100/66, pulse 77, temperature 98.3 F (36.8 C), temperature source Oral, resp. rate 18, height  (1.575 m), weight 72.6 kg (160 lb).Body mass index is 29.26  kg/m.  General Appearance:Disheveled  Eye Contact: Fair  Speech: normal  Volume: Normal  Mood: Dysphoric and Irritable  Affect: Labile and Tearful  Thought Process: Disorganized  Orientation: Full (Time, Place, and Person)  Thought Content: Tangential  Suicidal Thoughts: denies  Homicidal Thoughts: No  Memory: Immediate; Fair Recent; Fair Remote; Fair  Judgement: Poor  Insight: Shallow  Psychomotor Activity: Psychomotor Retardation  Concentration: Concentration: Poor  Recall: Fair  Fund of Knowledge: Good  Language: Good  Akathisia: No  Handed: Right  AIMS (if indicated):   Assets: Housing Physical Health Resilience  ADL's: Intact  Cognition: Impaired, Mild   Number of Hours: 6.15    Treatment Plan Summary: Daily contact with patient to assess and evaluate symptoms and progress in treatment and Medication management   Pt is currently  depressed and anxious in context of marital conflict. Pt has been on high dose of xanax, oxycodone and adderall. UDS + ve  for amphetamine. States xanax is  prn and has not taken for 3-4 days.   Plan-  Pt refusing meds for depression/anxiety. Xanax, addrerall and oxycodone were resumed yesterday with lower doses. TSH- 0.034, synthroid dose decreased to 150 mcg( was on 175 mcg at home), will monitor TFT. Add Robitussin for cough.  Will manage medical issues as appropriate.   Cont IVC  Observation Level/Precautions:  Elopement Continuous Observation Seizure  Laboratory:  See above  Psychotherapy:  Group/ milieu  Medications:  See above  Consultations:    Discharge Concerns:    Estimated LOS:   Other:     Physician Treatment Plan for Primary Diagnosis: <principal problem not specified> Long Term Goal(s): Improvement in symptoms so as ready for discharge  Short Term Goals: Ability to identify changes in lifestyle to reduce recurrence of condition will improve, Ability to verbalize feelings  will improve, Ability to disclose and discuss suicidal ideas, Ability to demonstrate self-control will improve, Ability to identify and develop effective coping behaviors will improve, Ability to maintain clinical measurements within normal limits will improve, Compliance with prescribed medications will improve and Ability to identify triggers associated with substance abuse/mental health issues will improve  Physician Treatment Plan for Secondary Diagnosis: Active Problems:   Severe recurrent major depression without psychotic features (HCC)  Long Term Goal(s): Improvement in symptoms so as ready for discharge  Short Term Goals: Ability to identify changes in lifestyle to reduce recurrence of condition will improve, Ability to verbalize feelings will improve, Ability to disclose and discuss suicidal ideas, Ability to demonstrate self-control will improve, Ability to identify and develop effective coping behaviors will improve, Ability to maintain clinical measurements within normal limits will improve, Compliance with prescribed medications will improve and Ability to identify triggers associated with substance abuse/mental health issues will improve  I certify that inpatient services furnished can reasonably be expected to  improve the patient's condition.     Beverly Sessions, MD 02/13/2017, 12:18 PM

## 2017-02-13 NOTE — Plan of Care (Signed)
Problem: Coping: Goal: Ability to verbalize frustrations and anger appropriately will improve Outcome: Progressing Pt verbalizes frustrations and anger related to admission appropriately.

## 2017-02-13 NOTE — [facility] Suicide Risk Assessment (Signed)
[facility] INPATIENT:  Family/Significant Other Suicide Prevention Education  Suicide Prevention Education:  Education Completed;Audry Thornton(mother [phone]), has been identified by the patient as the family member/significant other with whom the patient will be residing, and identified as the person(s) who will aid the patient in the event of a mental health crisis (suicidal ideations/suicide attempt).  With written consent from the patient, the family member/significant other has been provided the following suicide prevention education, prior to the and/or following the discharge of the patient. Mother informed CSW that she would be checking in on patient frequently when she returns home. Mother states she has no concerns about children returning home with patient.   The suicide prevention education provided includes the following:  Suicide risk factors  Suicide prevention and interventions  National Suicide Hotline telephone number  [facility] assessment telephone number  [facility] Emergency Assistance 911  [facility] and/or Residential Mobile Crisis Unit telephone number  Request made of family/significant other to:  Remove weapons (e.g., guns, rifles, knives), all items previously/currently identified as safety concern.    Remove drugs/medications (over-the-counter, prescriptions, illicit drugs), all items previously/currently identified as a safety concern.  The family member/significant other verbalizes understanding of the suicide prevention education information provided.  The family member/significant other agrees to remove the items of safety concern listed above.  Soul Deveney G. Garnette Czech MSW, LCSWA 02/13/2017, 11:17 AM

## 2017-02-13 NOTE — Plan of Care (Signed)
Problem: Coping: Goal: Ability to interact with others will improve Outcome: Progressing Pt bothered by delusional/psychotic peer that "thinks I'm a liar and a snitch." Support and reassurance provided. Pt socializes appropriately with other peers/staff.

## 2017-02-13 NOTE — Progress Notes (Addendum)
Pt voices concern regarding psychotic and delusional peer being verbally aggressive toward her last night and again this morning. Pt is tearful, upset when speaking with this nurse regarding the issue. Apparently, peer thinks pt is "a snitch and liar." Pt is adamant that she is not, "I told him he can check my record, he won't find a thing, not even a speeding ticket." Support provided, pt reassured that her safety will be maintained, to report to staff if peer approaches her again. Pt denies SI/HI/AVH. Oxycodone given as ordered PRN for pain rated 10/10. Pt complains that adderall ordered ( ) is not her usual dose of  BID, MD aware. Pt is medication compliant. Appears depressed, anxious. Voices readiness for discharge. Appropriately interacts with staff/peers. Observed socializing in dayroom, attends group.Reports good sleep last night although complains of coughing most of the night, good appetite, normal energy, good concentration. Rates depression 0/10, hopelessness 0/10, anxiety 0/10 (low 0-10 high). Goal today is "keeping positive about what I need to doin order to be home with my children." Support and encouragement provided. Medications administered with education as ordered. Safety maintained with every 15 minute checks. Will continue to monitor.

## 2017-02-14 ENCOUNTER — Encounter: Payer: Self-pay | Admitting: Psychiatry

## 2017-02-14 ENCOUNTER — Inpatient Hospital Stay: Payer: Medicaid Other

## 2017-02-14 DIAGNOSIS — F17200 Nicotine dependence, unspecified, uncomplicated: Secondary | ICD-10-CM | POA: Diagnosis present

## 2017-02-14 DIAGNOSIS — K219 Gastro-esophageal reflux disease without esophagitis: Secondary | ICD-10-CM | POA: Diagnosis present

## 2017-02-14 DIAGNOSIS — E039 Hypothyroidism, unspecified: Secondary | ICD-10-CM | POA: Diagnosis present

## 2017-02-14 NOTE — Progress Notes (Signed)
[facility] LCSW Group Therapy Note  Date/Time: 02/14/17, 0930  Type of Therapy and Topic:  Group Therapy:  Overcoming Obstacles  Participation Level: active  Description of Group:    In this group patients will be encouraged to explore what they see as obstacles to their own wellness and recovery. They will be guided to discuss their thoughts, feelings, and behaviors related to these obstacles. The group will process together ways to cope with barriers, with attention given to specific choices patients can make. Each patient will be challenged to identify changes they are motivated to make in order to overcome their obstacles. This group will be process-oriented, with patients participating in exploration of their own experiences as well as giving and receiving support and challenge from other group members.  Therapeutic Goals: 1. Patient will identify personal and current obstacles as they relate to admission. 2. Patient will identify barriers that currently interfere with their wellness or overcoming obstacles.  3. Patient will identify feelings, thought process and behaviors related to these barriers. 4. Patient will identify two changes they are willing to make to overcome these obstacles:    Summary of Patient Progress  Pt reported that heatlh issues and marital issues were obstacles  In her life and that her plan to overcome them is to "be smarter" in her relationships.  Pt was active in group with comments and also with giving feedback to other group members.     Therapeutic Modalities:   Cognitive Behavioral Therapy Solution Focused Therapy Motivational Interviewing Relapse Prevention Therapy  Daleen Squibb, LCSW

## 2017-02-14 NOTE — Tx Team (Signed)
Interdisciplinary Treatment and Diagnostic Plan Update  02/14/2017 Time of Session: 10:30 AM [name] MRN: [id number]  Principal Diagnosis: Severe recurrent major depression without psychotic features (HCC)  Secondary Diagnoses: Principal Problem:   Severe recurrent major depression without psychotic features (HCC) Active Problems:   Urge incontinence   Adjustment disorder with mixed disturbance of emotions and conduct   Hypothyroidism   GERD (gastroesophageal reflux disease)   Tobacco use disorder   Current Medications:  Current Facility-Administered Medications  Medication Dose Route Frequency Provider Last Rate Last Dose  . acetaminophen (TYLENOL) tablet 650 mg  650 mg Oral Q6H PRN Audery Amel, MD      . alum & mag hydroxide-simeth (MAALOX/MYLANTA) 200-200-20 MG/5ML suspension 30 mL  30 mL Oral Q4H PRN Audery Amel, MD      . diclofenac (VOLTAREN) EC tablet 100 mg  100 mg Oral Daily PRN Beverly Sessions, MD      . guaiFENesin (ROBITUSSIN) 100 MG/5ML solution 200 mg  10 mL Oral Q6H PRN Beverly Sessions, MD   200 mg at 02/13/17 2114  . levothyroxine (SYNTHROID, LEVOTHROID) tablet 150 mcg  150 mcg Oral QAC breakfast Beverly Sessions, MD   150 mcg at 02/14/17 0620  . loratadine (CLARITIN) tablet 10 mg  10 mg Oral Daily Beverly Sessions, MD   10 mg at 02/14/17 0800  . magnesium hydroxide (MILK OF MAGNESIA) suspension 30 mL  30 mL Oral Daily PRN Audery Amel, MD      . mirabegron ER [facility]) tablet 25 mg  25 mg Oral Daily Beverly Sessions, MD   25 mg at 02/14/17 0827  . montelukast (SINGULAIR) tablet 10 mg  10 mg Oral QHS Beverly Sessions, MD   10 mg at 02/13/17 2114  . pantoprazole (PROTONIX) EC tablet 40 mg  40 mg Oral Daily Beverly Sessions, MD   40 mg at 02/14/17 0827   PTA Medications: Facility-Administered Medications Prior to Admission  Medication Dose Route Frequency Provider Last Rate Last Dose  . bupivacaine (PF) (MARCAINE) 0.25 % injection 30 mL  30 mL  Other Once Ewing Schlein, MD      . bupivacaine (PF) (MARCAINE) 0.25 % injection 30 mL  30 mL Other Once Ewing Schlein, MD      . bupivacaine (PF) (MARCAINE) 0.25 % injection 30 mL  30 mL Other Once Ewing Schlein, MD      . bupivacaine (PF) (MARCAINE) 0.25 % injection 30 mL  30 mL Other Once Ewing Schlein, MD      . ceFAZolin (ANCEF) IVPB 1 g/50 mL premix  1 g Intravenous Once Ewing Schlein, MD      . fentaNYL (SUBLIMAZE) injection 100 mcg  100 mcg Intravenous UD Ewing Schlein, MD   100 mcg at 07/09/15 1140  . fentaNYL (SUBLIMAZE) injection 100 mcg  100 mcg Intravenous Once Ewing Schlein, MD      . fentaNYL (SUBLIMAZE) injection 100 mcg  100 mcg Intravenous Once Ewing Schlein, MD      . fentaNYL (SUBLIMAZE) injection 100 mcg  100 mcg Intravenous Once Ewing Schlein, MD      . fentaNYL (SUBLIMAZE) injection 100 mcg  100 mcg Intravenous Once Ewing Schlein, MD      . lactated ringers infusion 1,000 mL  1,000 mL Intravenous Continuous Ewing Schlein, MD      . lactated ringers infusion 1,000 mL  1,000 mL Intravenous Continuous Ewing Schlein, MD      . lactated ringers infusion 1,000 mL  1,000 mL Intravenous  Continuous Ewing Schlein, MD      . lactated ringers infusion 1,000 mL  1,000 mL Intravenous Continuous Ewing Schlein, MD      . lactated ringers infusion 1,000 mL  1,000 mL Intravenous Continuous Ewing Schlein, MD      . lactated ringers infusion 1,000 mL  1,000 mL Intravenous Continuous Ewing Schlein, MD 125 mL/hr at 05/05/16 1123 1,000 mL at 05/05/16 1123  . lidocaine (PF) (XYLOCAINE) 1 % injection 10 mL  10 mL Subcutaneous Once Ewing Schlein, MD      . midazolam (VERSED) 5 MG/5ML injection 5 mg  5 mg Intravenous UD Ewing Schlein, MD   5 mg at 07/09/15 1141  . midazolam (VERSED) 5 MG/5ML injection 5 mg  5 mg Intravenous Once Ewing Schlein, MD      . midazolam (VERSED) 5 MG/5ML injection 5 mg  5 mg Intravenous Once Ewing Schlein, MD      . midazolam (VERSED) 5 MG/5ML injection 5 mg  5 mg  Intravenous Once Ewing Schlein, MD      . midazolam (VERSED) 5 MG/5ML injection 5 mg  5 mg Intravenous Once Ewing Schlein, MD      . orphenadrine (NORFLEX) injection 60 mg  60 mg Intramuscular Once Ewing Schlein, MD      . orphenadrine (NORFLEX) injection 60 mg  60 mg Intramuscular Once Ewing Schlein, MD      . orphenadrine (NORFLEX) injection 60 mg  60 mg Intramuscular Once Ewing Schlein, MD      . orphenadrine (NORFLEX) injection 60 mg  60 mg Intramuscular Once Ewing Schlein, MD      . triamcinolone acetonide (KENALOG-40) injection 40 mg  40 mg Other Once Ewing Schlein, MD      . triamcinolone acetonide [facility]) injection 40 mg  40 mg Other Once Ewing Schlein, MD      . triamcinolone acetonide [facility]) injection 40 mg  40 mg Other Once Ewing Schlein, MD      . triamcinolone acetonide (KENALOG-40) injection 40 mg  40 mg Other Once Ewing Schlein, MD       Prescriptions Prior to Admission  Medication Sig Dispense Refill Last Dose  . alprazolam (XANAX) 2 MG tablet Take 2 mg by mouth 4 (four) times daily as needed for sleep or anxiety.    Unknown at Unknown  . amphetamine-dextroamphetamine (ADDERALL) 30 MG tablet Take 30 mg by mouth 3 (three) times daily.    Unknown at Unknown  . diclofenac (CATAFLAM) 50 MG tablet 1 tab by mouth per day or twice a day if tolerated (Patient taking differently: Take 50 mg by mouth 2 (two) times daily. ) 60 tablet 2 Unknown at Unknown  . diclofenac sodium (VOLTAREN) 1 % GEL Apply 2-4 g to painful area of skin 4 times per day if tolerated. Apply only a maximum of 4 g per application (Patient not taking: Reported on 11/19/2016) 500 g 0 Not Taking  . levocetirizine (XYZAL) 5 MG tablet Take 5 mg by mouth every evening.   Unknown at Unknown  . levothyroxine (SYNTHROID, LEVOTHROID) 175 MCG tablet Take 175 mcg by mouth. 6 days a week   Unknown at Unknown  . mirabegron ER (MYRBETRIQ) 50 MG TB24 tablet Take 1 tablet (50 mg total) by mouth daily. 90 tablet 3 Unknown at  Unknown  . orphenadrine (NORFLEX) 100 MG tablet Limit 1 tablet by mouth per day or twice per day if tolerated  NO ROBAXIN (METHOCARBAMOL) (Patient not taking: Reported on 11/19/2016) 50 tablet 0  Not Taking  . Oxycodone HCl 10 MG TABS 1-2 tablets by mouth twice a day to 3 times a day if tolerated 180 tablet 0 Unknown at Unknown  . pramoxine (PROCTOFOAM) 1 % foam Place 1 application rectally 3 (three) times daily as needed for itching. (Patient not taking: Reported on 11/19/2016) 15 g 0 Not Taking    Patient Stressors:    Patient Strengths:    Treatment Modalities: Medication Management, Group therapy, Case management,  1 to 1 session with clinician, Psychoeducation, Recreational therapy.   Physician Treatment Plan for Primary Diagnosis: Severe recurrent major depression without psychotic features (HCC) Long Term Goal(s): Improvement in symptoms so as ready for discharge Improvement in symptoms so as ready for discharge   Short Term Goals: Ability to identify changes in lifestyle to reduce recurrence of condition will improve Ability to verbalize feelings will improve Ability to disclose and discuss suicidal ideas Ability to demonstrate self-control will improve Ability to identify and develop effective coping behaviors will improve Ability to maintain clinical measurements within normal limits will improve Compliance with prescribed medications will improve Ability to identify triggers associated with substance abuse/mental health issues will improve Ability to identify changes in lifestyle to reduce recurrence of condition will improve Ability to verbalize feelings will improve Ability to disclose and discuss suicidal ideas Ability to demonstrate self-control will improve Ability to identify and develop effective coping behaviors will improve Ability to maintain clinical measurements within normal limits will improve Compliance with prescribed medications will improve Ability to  identify triggers associated with substance abuse/mental health issues will improve  Medication Management: Evaluate patient's response, side effects, and tolerance of medication regimen.  Therapeutic Interventions: 1 to 1 sessions, Unit Group sessions and Medication administration.  Evaluation of Outcomes: Adequate for Discharge  Physician Treatment Plan for Secondary Diagnosis: Principal Problem:   Severe recurrent major depression without psychotic features (HCC) Active Problems:   Urge incontinence   Adjustment disorder with mixed disturbance of emotions and conduct   Hypothyroidism   GERD (gastroesophageal reflux disease)   Tobacco use disorder  Long Term Goal(s): Improvement in symptoms so as ready for discharge Improvement in symptoms so as ready for discharge   Short Term Goals: Ability to identify changes in lifestyle to reduce recurrence of condition will improve Ability to verbalize feelings will improve Ability to disclose and discuss suicidal ideas Ability to demonstrate self-control will improve Ability to identify and develop effective coping behaviors will improve Ability to maintain clinical measurements within normal limits will improve Compliance with prescribed medications will improve Ability to identify triggers associated with substance abuse/mental health issues will improve Ability to identify changes in lifestyle to reduce recurrence of condition will improve Ability to verbalize feelings will improve Ability to disclose and discuss suicidal ideas Ability to demonstrate self-control will improve Ability to identify and develop effective coping behaviors will improve Ability to maintain clinical measurements within normal limits will improve Compliance with prescribed medications will improve Ability to identify triggers associated with substance abuse/mental health issues will improve     Medication Management: Evaluate patient's response, side effects,  and tolerance of medication regimen.  Therapeutic Interventions: 1 to 1 sessions, Unit Group sessions and Medication administration.  Evaluation of Outcomes: Adequate for Discharge   RN Treatment Plan for Primary Diagnosis: Severe recurrent major depression without psychotic features (HCC) Long Term Goal(s): Knowledge of disease and therapeutic regimen to maintain health will improve  Short Term Goals: Ability to demonstrate self-control, Ability to disclose and discuss suicidal  ideas and Ability to identify and develop effective coping behaviors will improve  Medication Management: RN will administer medications as ordered by provider, will assess and evaluate patient's response and provide education to patient for prescribed medication. RN will report any adverse and/or side effects to prescribing provider.  Therapeutic Interventions: 1 on 1 counseling sessions, Psychoeducation, Medication administration, Evaluate responses to treatment, Monitor vital signs and CBGs as ordered, Perform/monitor CIWA, COWS, AIMS and Fall Risk screenings as ordered, Perform wound care treatments as ordered.  Evaluation of Outcomes: Adequate for Discharge   LCSW Treatment Plan for Primary Diagnosis: Severe recurrent major depression without psychotic features (HCC) Long Term Goal(s): Safe transition to appropriate next level of care at discharge, Engage patient in therapeutic group addressing interpersonal concerns.  Short Term Goals: Engage patient in aftercare planning with referrals and resources, Increase social support and Increase emotional regulation  Therapeutic Interventions: Assess for all discharge needs, 1 to 1 time with Social worker, Explore available resources and support systems, Assess for adequacy in community support network, Educate family and significant other(s) on suicide prevention, Complete Psychosocial Assessment, Interpersonal group therapy.  Evaluation of Outcomes: Adequate for  Discharge   Progress in Treatment: Attending groups: Yes. Participating in groups: Yes. Taking medication as prescribed: Yes. Toleration medication: Yes. Family/Significant other contact made: Yes, individual(s) contacted:  CSW spoke with pt's husband and pt's mother. Patient understands diagnosis: Yes. Discussing patient identified problems/goals with staff: Yes. Medical problems stabilized or resolved: Yes. Denies suicidal/homicidal ideation: Yes. Issues/concerns per patient self-inventory: No.  New problem(s) identified: No, Describe:  None identified.  New Short Term/Long Term Goal(s): Patient's goal is to implement new coping mechanisms.   Discharge Plan or Barriers: Patient will discharge home and follow-up with [facility].  Reason for Continuation of Hospitalization: Depression Homicidal ideation Suicidal ideation  Anxiety  Estimated Length of Stay: D/C 02/14/2017  Attendees: Patient: [name] 02/14/2017 11:44 AM  Physician: Dr. Kristine Linea, MD  02/14/2017 11:44 AM  Nursing: Shelia Media, RN 02/14/2017 11:44 AM  RN Care Manager: 02/14/2017 11:44 AM  Social Worker: Hampton Abbot, MSW, LCSW-A 02/14/2017 11:44 AM  Recreational Therapist: Princella Ion, LRT, CTRS  02/14/2017 11:44 AM  Other:  02/14/2017 11:44 AM  Other:  02/14/2017 11:44 AM  Other: 02/14/2017 11:44 AM    Scribe for Treatment Team: Lynden Oxford, LCSWA 02/14/2017 11:44 AM

## 2017-02-14 NOTE — Progress Notes (Signed)
  [facility] Adult Case Management Discharge Plan :  Will you be returning to the same living situation after discharge:  Yes,  home with family. At discharge, do you have transportation home?: Yes,  family will pick pt up. Do you have the ability to pay for your medications: Yes,  Medicaid.  Release of information consent forms completed and in the chart;  Patient's signature needed at discharge.  Patient to Follow up at: Follow-up Information    [facility]. Go on 02/15/2017.   Why:  Please follow-up with Dr. Janeece Riggers at [facility] at 1:20 PM. We ask that you bring your discharge paperwork to this appointment. If you have questions or concerns, contact [facility] directly. Contact information: [address] [place] [address] [phone]           Next level of care provider has access to [facility] Link:no  Safety Planning and Suicide Prevention discussed: Yes,  SPE completed with pt's husband and mother.  Have you used any form of tobacco in the last 30 days? (Cigarettes, Smokeless Tobacco, Cigars, and/or Pipes): Yes  Has patient been referred to the Quitline?: Patient refused referral  Patient has been referred for addiction treatment: N/A  Lynden Oxford, MSW, LCSW-A 02/14/2017, 11:20 AM

## 2017-02-14 NOTE — Progress Notes (Signed)
Pt did not want to wait for x-ray results of toe. Pt states she has gauze and wrap to tape the toe.

## 2017-02-14 NOTE — Progress Notes (Signed)
Patient discharged home. DC instructions provided and explained. Medications reviewed. Rx given. All questions answered. Denies SI, HI, AVH. AVS, transition and risk assessment given to patient. Belongings returned. Pt stable at discharge.

## 2017-02-14 NOTE — Discharge Summary (Signed)
Physician Discharge Summary Note  Patient:  [name] is an 41 y.o., female MRN:  [id number] DOB:  [birth year] Patient phone:  [phone] (home)  Patient address:   [address] [place] [address],  Total Time spent with patient: 30 minutes  Date of Admission:  02/11/2017 Date of Discharge: 02/14/2017  Reason for Admission:  Suicidal ideation.  History of Present Illness:   " Im here because of my husband" " Im not suicidal"  Pt is  41 year old woman admitted under IVC from ED . Per report, she had been involved in an argument via Facebook messages with her husband,  she made a comment about killing herself and killing her children, her husband had called the police . Her husband  had filed commitment papers on her. Pt presents as very labile, anxious, tearful, endorsing anxiety, sadness, helplessness, hopelessness. Admits voicing suicidal statement "to see his reaction" , denies intent/plan, states she has to live for her kids. Pt has poor insight, requesting to release now as  she misses her children. Plans to end this relationship as she states he is cheating on her.   she and her husband only got married in January .  Admits that she has suicidal thoughts that cross her mind with some frequency but says that she would never do it, states " I'm coward" to do it.  Reports h/o anxiety and depression,  was tried every antidepressant and  did not work, not willing to try any, states " just give me my meds". She has been on Adderall 15 mg bid, Xanax  qid/prn, Oxycodone  tid/prn  By OP providers in [place] ( Su [name]) and [place]( Dr. Metta Foley). [place] controlled drug registry reviewed. Denies HI. Denies AVH.    Associated Signs/Symptoms: Depression Symptoms:  depressed mood, insomnia, hopelessness, (Hypo) Manic Symptoms:  Irritable Mood, Anxiety Symptoms:  Excessive Worry, Psychotic Symptoms: denies PTSD Symptoms: denies  Past Psychiatric History:  Patient has a past history  of psychiatric admission and a past history of suicide attempts. OD on pills at age 3. Currently sees an outpatient psychiatrist but claims she is not actually on any antidepressants but rather takes Adderall Xanax mostly. Previous Psychotropic Medications:xanax, adderall, many antidepressants  Family Psychiatric  History: Patient had an uncle who committed suicide. She also had an ex-husband who had committed suicide although it was not obviously a biological relative.  Social History:  She got married to her husband whom she has known for 2 years in January. 3 weeks ago he filed for divorce and moved out. The patient states that she is glad about it as he was "controlling her". She lives with her 2 teenage daughters.  Principal Problem: Severe recurrent major depression without psychotic features [facility]) Discharge Diagnoses: Patient Active Problem List   Diagnosis Date Noted  . Hypothyroidism [E03.9] 02/14/2017  . GERD (gastroesophageal reflux disease) [K21.9] 02/14/2017  . Tobacco use disorder [F17.200] 02/14/2017  . Adjustment disorder with mixed disturbance of emotions and conduct [F43.25] 02/11/2017  . Severe recurrent major depression without psychotic features (HCC) [F33.2] 02/11/2017  . Microscopic hematuria [R31.29] 11/19/2016  . Pelvic pain in female [R10.2] 11/19/2016  . 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 DDD (degenerative disc disease), lumbar [M51.36] 03/06/2015  . Lumbar post-laminectomy syndrome [M96.1] 03/06/2015  . DDD (degenerative disc disease), cervical [M50.30] 03/06/2015  . 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 Bilateral occipital neuralgia [M54.81] 03/06/2015  .  Adnexal pain [R10.2] 07/13/2013  . Female genuine stress incontinence [N39.3] 07/13/2013  . Urge incontinence [N39.41] 07/13/2013  . FOM (frequency of micturition) [R35.0] 07/13/2013   Past  Medical History:  Past Medical History:  Diagnosis Date  . Asthma   . Bleeding hemorrhoid   . GERD (gastroesophageal reflux disease)   . Hypothyroidism   . Seizures (HCC)     Past Surgical History:  Procedure Laterality Date  . CESAREAN SECTION  2003   with tubal ligation   . CHOLESTEATOMA EXCISION  2010   to remove a benign tumor behind R ear drum   . removal of bilateral fallopian tubes Bilateral 2012  . removal of L ovary Left 2012   due a cyst and scar tissue  . SPINE SURGERY  2012   2 surgeries: to remove a herniated disc and to remove a pus pocket at incision site   . TUBAL LIGATION    . VAGINAL HYSTERECTOMY  2007     due to fibroid cysts   Family History:  Family History  Problem Relation Age of Onset  . Alcohol abuse Mother   . Arthritis Mother   . Depression Mother   . Hearing loss Mother   . Alcohol abuse Father   . Arthritis Father   . COPD Father   . Depression Father   . Hearing loss Father   . Hyperlipidemia Father   . Hypertension Father     Social History:  History  Alcohol Use No    Comment: occasionally     History  Drug Use No    Social History   Social History  . Marital status: Married    Spouse name: N/A  . Number of children: N/A  . Years of education: N/A   Social History Main Topics  . Smoking status: Current Every Day Smoker    Packs/day: 1.00    Types: Cigarettes  . Smokeless tobacco: Never Used  . Alcohol use No     Comment: occasionally  . Drug use: No  . Sexual activity: Yes   Other Topics Concern  . None   Social History Narrative  . None    Hospital Course:    Ms. [name] is a 41 year old female with a history of depression, anxiety, and chronic pain admitted for suicidal and homicidal threats in the context of marital dissolution.  1. Suicidal ideation. The patient adamantly denies any thoughts, intentions, or plans to hurt herself or others. She is able to contract for safety. She is forward thinking and  optimistic about the future. She is a loving mother.  2. Mood and anxiety. She has been tried on multiple antidepressants with no improvement. She is currently a patient of Dr. Janeece Foley. She receives Xanax as needed for anxiety. She is not interested in medication changes.  3. ADHD. She is on Adderall.  4. Chronic pain. She is on narcotic painkillers prescribed by her primary care provider. The patient was made aware that the combination of benzodiazepine and narcotic painkillers is dangerous.  5. Hypothyroidism. She is on Synthroid.  6. Overactive bladder. She is Myrbetriq.  7. Fractured toe. We will order an x-ray awaiting results.  8. Smoking. Nicotine patch is available.   9. Disposition. She was discharged to home. She will follow up with Dr. Janeece Foley.    Physical Findings: AIMS:  , ,  ,  ,    CIWA:    COWS:     Musculoskeletal: Strength & Muscle Tone: within normal limits  Gait & Station: normal Patient leans: N/A  Psychiatric Specialty Exam: Physical Exam  Nursing note and vitals reviewed. Psychiatric: She has a normal mood and affect. Her speech is normal and behavior is normal. Thought content normal. Cognition and memory are normal. She expresses impulsivity.    Review of Systems  Musculoskeletal: Positive for back pain and joint pain.  All other systems reviewed and are negative.   Blood pressure 100/66, pulse 77, temperature 98.3 F (36.8 C), temperature source Oral, resp. rate 18, height  (1.575 m), weight 72.6 kg (160 lb).Body mass index is 29.26 kg/m.  General Appearance: Casual  Eye Contact:  Good  Speech:  Clear and Coherent  Volume:  Normal  Mood:  Euthymic  Affect:  Appropriate  Thought Process:  Goal Directed and Descriptions of Associations: Intact  Orientation:  Full (Time, Place, and Person)  Thought Content:  WDL  Suicidal Thoughts:  No  Homicidal Thoughts:  No  Memory:  Immediate;   Fair Recent;   Fair Remote;   Fair  Judgement:  Impaired   Insight:  Lacking  Psychomotor Activity:  Normal  Concentration:  Concentration: Fair and Attention Span: Fair  Recall:  [employer] of Knowledge:  Fair  Language:  Fair  Akathisia:  No  Handed:  Right  AIMS (if indicated):     Assets:  Communication Skills Desire for Improvement Financial Resources/Insurance Housing Resilience Social Support Transportation  ADL's:  Intact  Cognition:  WNL  Sleep:  Number of Hours: 7.5     Have you used any form of tobacco in the last 30 days? (Cigarettes, Smokeless Tobacco, Cigars, and/or Pipes): Yes  Has this patient used any form of tobacco in the last 30 days? (Cigarettes, Smokeless Tobacco, Cigars, and/or Pipes) Yes, Yes, A prescription for an FDA-approved tobacco cessation medication was offered at discharge and the patient refused  Blood Alcohol level:  Lab Results  Component Value Date   [facility] <5 02/10/2017    Metabolic Disorder Labs:  Lab Results  Component Value Date   HGBA1C 5.2 02/12/2017   MPG 103 02/12/2017   Lab Results  Component Value Date   PROLACTIN 15.2 02/12/2017   Lab Results  Component Value Date   CHOL 234 (H) 02/12/2017   TRIG 94 02/12/2017   HDL 43 02/12/2017   CHOLHDL 5.4 02/12/2017   VLDL 19 02/12/2017   LDLCALC 172 (H) 02/12/2017    See Psychiatric Specialty Exam and Suicide Risk Assessment completed by Attending Physician prior to discharge.  Discharge destination:  Home  Is patient on multiple antipsychotic therapies at discharge:  No   Has Patient had three or more failed trials of antipsychotic monotherapy by history:  No  Recommended Plan for Multiple Antipsychotic Therapies: NA  Discharge Instructions    Diet - low sodium heart healthy    Complete by:  As directed    Increase activity slowly    Complete by:  As directed      Allergies as of 02/14/2017      Reactions   Celecoxib Other (See Comments)   Darvon [propoxyphene] Hypertension   Other reaction(s): Unknown Heart race    Gabapentin Rash      Medication List    STOP taking these medications   orphenadrine 100 MG tablet Commonly known as:  NORFLEX   pramoxine 1 % foam Commonly known as:  PROCTOFOAM     TAKE these medications     Indication  alprazolam 2 MG tablet Commonly known as:  XANAX Take 2 mg by mouth 4 (four) times daily as needed for sleep or anxiety.  Indication:  Panic Disorder   amphetamine-dextroamphetamine 30 MG tablet Commonly known as:  ADDERALL Take 30 mg by mouth 3 (three) times daily.  Indication:  Attention Deficit Hyperactivity Disorder   diclofenac 50 MG tablet Commonly known as:  CATAFLAM 1 tab by mouth per day or twice a day if tolerated What changed:  how much to take  how to take this  when to take this  additional instructions  Indication:  Rheumatic Disease causing Vertebrae Inflammation   diclofenac sodium 1 % Gel Commonly known as:  VOLTAREN Apply 2-4 g to painful area of skin 4 times per day if tolerated. Apply only a maximum of 4 g per application  Indication:  Joint Damage causing Pain and Loss of Function   levocetirizine 5 MG tablet Commonly known as:  XYZAL Take 5 mg by mouth every evening.  Indication:  Perennial Allergic Rhinitis   levothyroxine 175 MCG tablet Commonly known as:  SYNTHROID, LEVOTHROID Take 175 mcg by mouth. 6 days a week  Indication:  patient takes 6 days per week.   mirabegron ER 50 MG Tb24 tablet Commonly known as:  MYRBETRIQ Take 1 tablet (50 mg total) by mouth daily.  Indication:  Urinary Urgency   Oxycodone HCl 10 MG Tabs 1-2 tablets by mouth twice a day to 3 times a day if tolerated  Indication:  Chronic Pain      Follow-up Information    [facility]. Go on 02/15/2017.   Why:  Please follow-up with Dr. Janeece Foley at [facility] at 1:20 PM. We ask that you bring your discharge paperwork to this appointment. If you have questions or concerns, contact [facility] directly. Contact information: [address] [place] [address] [phone]           Follow-up recommendations:  Activity:  As tolerated. Diet:  Low sodium heart healthy. Other:  Keep follow-up appointments.  Comments:    Signed: Kristine Linea, MD 02/14/2017, 11:40 AM

## 2017-02-14 NOTE — Progress Notes (Signed)
Recreation Therapy Notes  Date: 04.23.18 Time: 1:00 pm Location: Craft Room  Group Topic: Wellness  Goal Area(s) Addresses:  Patient will identify at least one item per dimension of health. Patient will examine areas they are deficient in.  Behavioral Response: Attentive, Interactive  Intervention: 6 Dimensions of Health  Activity: Patients were given a definition sheet defining each dimension of health and a worksheet with each dimension listed. Patients were instructed to write at least one item they were currently doing in each dimension of health.  Education: LRT educated patients on ways to improve each dimension of health.  Education Outcome: Acknowledges education/In group clarification offered   Clinical Observations/Feedback: Patient wrote at least one item in all but one dimension. Patient contributed to group discussion by stating what areas she was giving enough attention to, what areas she was not giving enough attention to, how she can improve certain dimension, how this activity relates to her admission, how this activity relates to her d/c, and what would change for her if she was more aware of her wellness.  Jacquelynn Cree, LRT/CTRS 02/14/2017 1:57 PM

## 2017-02-14 NOTE — BHH Suicide Risk Assessment (Signed)
[facility] Discharge Suicide Risk Assessment   Principal Problem: Severe recurrent major depression without psychotic features [facility]) Discharge Diagnoses:  Patient Active Problem List   Diagnosis Date Noted  . Hypothyroidism [E03.9] 02/14/2017  . GERD (gastroesophageal reflux disease) [K21.9] 02/14/2017  . Tobacco use disorder [F17.200] 02/14/2017  . Adjustment disorder with mixed disturbance of emotions and conduct [F43.25] 02/11/2017  . Severe recurrent major depression without psychotic features (HCC) [F33.2] 02/11/2017  . Microscopic hematuria [R31.29] 11/19/2016  . Pelvic pain in female [R10.2] 11/19/2016  . 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 DDD (degenerative disc disease), lumbar [M51.36] 03/06/2015  . Lumbar post-laminectomy syndrome [M96.1] 03/06/2015  . DDD (degenerative disc disease), cervical [M50.30] 03/06/2015  . 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 Bilateral occipital neuralgia [M54.81] 03/06/2015  . Adnexal pain [R10.2] 07/13/2013  . Female genuine stress incontinence [N39.3] 07/13/2013  . Urge incontinence [N39.41] 07/13/2013  . FOM (frequency of micturition) [R35.0] 07/13/2013    Total Time spent with patient: 30 minutes  Musculoskeletal: Strength & Muscle Tone: within normal limits Gait & Station: normal Patient leans: N/A  Psychiatric Specialty Exam: Review of Systems  Musculoskeletal: Positive for back pain and joint pain.  All other systems reviewed and are negative.   Blood pressure 100/66, pulse 77, temperature 98.3 F (36.8 C), temperature source Oral, resp. rate 18, height  (1.575 m), weight 72.6 kg (160 lb).Body mass index is 29.26 kg/m.  General Appearance: Casual  Eye Contact::  Good  Speech:  Clear and Coherent409  Volume:  Normal  Mood:  Euthymic  Affect:  Appropriate  Thought Process:  Goal Directed and Descriptions of  Associations: Intact  Orientation:  Full (Time, Place, and Person)  Thought Content:  WDL  Suicidal Thoughts:  No  Homicidal Thoughts:  No  Memory:  Immediate;   Fair Recent;   Fair Remote;   Fair  Judgement:  Impaired  Insight:  Present  Psychomotor Activity:  Normal  Concentration:  Fair  Recall:  [employer] of Knowledge:Fair  Language: Fair  Akathisia:  No  Handed:  Right  AIMS (if indicated):     Assets:  Communication Skills Desire for Improvement Financial Resources/Insurance Housing Resilience Social Support Transportation  Sleep:  Number of Hours: 7.5  Cognition: WNL  ADL's:  Intact   Mental Status Per Nursing Assessment::   On Admission:     Demographic Factors:  Caucasian  Loss Factors: Loss of significant relationship  Historical Factors: Prior suicide attempts, Family history of mental illness or substance abuse and Impulsivity  Risk Reduction Factors:   Responsible for children under 38 years of age, Sense of responsibility to family, Living with another person, especially a relative, Positive social support and Positive therapeutic relationship  Continued Clinical Symptoms:  Depression:   Impulsivity Chronic Pain Medical Diagnoses and Treatments/Surgeries  Cognitive Features That Contribute To Risk:  None    Suicide Risk:  Minimal: No identifiable suicidal ideation.  Patients presenting with no risk factors but with morbid ruminations; may be classified as minimal risk based on the severity of the depressive symptoms  Follow-up Information    [facility]. Go on 02/15/2017.   Why:  Please follow-up with Dr. Janeece Riggers at [facility] at 1:20 PM. We ask that you bring your discharge paperwork to this appointment. If you have questions or concerns, contact [facility] directly. Contact information: [address] [place] [address] [phone]  Plan Of Care/Follow-up recommendations:  Activity:  As  tolerated. Diet:  Low sodium heart healthy. Other:  Keep follow-up appointments.  Kristine Linea, MD 02/14/2017, 11:36 AM

## 2017-03-31 ENCOUNTER — Telehealth: Payer: Self-pay | Admitting: Urology

## 2017-03-31 NOTE — Telephone Encounter (Signed)
Patient is calling asking if her prior Berkley Harveyauth has been done on her mybretriq? She said it  Has been faxed over to us several times and they have not gotten a response. Can you check on this for her and call her back.  She can be reached at [phone]  Thanks, Marcelino DusterMichelle

## 2017-04-01 NOTE — Telephone Encounter (Addendum)
Spoke to patient advised Myrbetriq 50mg   [place] approved today. Pt will notify pharmacy. WG#[id number]#[id number] Approval Dates: 04/01/17-03/27/2018 Interaction ID: [id number]

## 2017-05-03 NOTE — Telephone Encounter (Signed)
NA

## 2017-08-20 ENCOUNTER — Emergency Department: Payer: Medicaid Other

## 2017-08-20 ENCOUNTER — Encounter: Payer: Self-pay | Admitting: Emergency Medicine

## 2017-08-20 ENCOUNTER — Emergency Department
Admission: EM | Admit: 2017-08-20 | Discharge: 2017-08-20 | Disposition: A | Discharge status: Home / Self Care | Payer: Medicaid Other | Attending: Emergency Medicine | Admitting: Emergency Medicine

## 2017-08-20 DIAGNOSIS — F17210 Nicotine dependence, cigarettes, uncomplicated: Secondary | ICD-10-CM | POA: Insufficient documentation

## 2017-08-20 DIAGNOSIS — Y929 Unspecified place or not applicable: Secondary | ICD-10-CM | POA: Diagnosis not present

## 2017-08-20 DIAGNOSIS — X58XXXA Exposure to other specified factors, initial encounter: Secondary | ICD-10-CM | POA: Insufficient documentation

## 2017-08-20 DIAGNOSIS — Z79899 Other long term (current) drug therapy: Secondary | ICD-10-CM | POA: Insufficient documentation

## 2017-08-20 DIAGNOSIS — E039 Hypothyroidism, unspecified: Secondary | ICD-10-CM | POA: Diagnosis not present

## 2017-08-20 DIAGNOSIS — Y939 Activity, unspecified: Secondary | ICD-10-CM | POA: Diagnosis not present

## 2017-08-20 DIAGNOSIS — Y999 Unspecified external cause status: Secondary | ICD-10-CM | POA: Insufficient documentation

## 2017-08-20 DIAGNOSIS — L600 Ingrowing nail: Secondary | ICD-10-CM | POA: Diagnosis not present

## 2017-08-20 DIAGNOSIS — S99921A Unspecified injury of right foot, initial encounter: Secondary | ICD-10-CM

## 2017-08-20 DIAGNOSIS — J45909 Unspecified asthma, uncomplicated: Secondary | ICD-10-CM | POA: Diagnosis not present

## 2017-08-20 DIAGNOSIS — S96001A Unspecified injury of muscle and tendon of long flexor muscle of toe at ankle and foot level, right foot, initial encounter: Secondary | ICD-10-CM | POA: Insufficient documentation

## 2017-08-20 MED ORDER — CLINDAMYCIN HCL 300 MG PO CAPS: 300.0000 mg | ORAL_CAPSULE | Freq: Four times a day (QID) | ORAL | 0 refills | Status: AC

## 2017-08-20 NOTE — ED Provider Notes (Signed)
[facility] Emergency Department Provider Note  ____________________________________________  Time seen: Approximately 5:37 PM  I have reviewed the triage vital signs and the nursing notes.   HISTORY  Chief Complaint Toe Injury    HPI [name] is a 41 y.o. female that presents to the emergency department for evaluation of right toe pain for 2 days.She stubbed her toe 2 days ago. Patient states that she noticed this morning an area by the toenail that is split. She has been applying Neosporin and peroxide to the area. No fever, numbness, tingling.   Past Medical History:  Diagnosis Date  . Asthma   . Bleeding hemorrhoid   . GERD (gastroesophageal reflux disease)   . Hypothyroidism   . Seizures [facility](HCC)     Patient Active Problem List   Diagnosis Date Noted  . Hypothyroidism 02/14/2017  . GERD (gastroesophageal reflux disease) 02/14/2017  . Tobacco use disorder 02/14/2017  . Adjustment disorder with mixed disturbance of emotions and conduct 02/11/2017  . Severe recurrent major depression without psychotic features (HCC) 02/11/2017  . Microscopic hematuria 11/19/2016  . Pelvic pain in female 11/19/2016  . Sacroiliac joint dysfunction 06/03/2015  . DJD (degenerative joint disease) of knee 03/28/2015  . DDD (degenerative disc disease), lumbar 03/06/2015  . Lumbar post-laminectomy syndrome 03/06/2015  . DDD (degenerative disc disease), cervical 03/06/2015  . Degenerative joint disease of sacroiliac joint 03/06/2015  . Bilateral occipital neuralgia 03/06/2015  . Adnexal pain 07/13/2013  . Female genuine stress incontinence 07/13/2013  . Urge incontinence 07/13/2013  . FOM (frequency of micturition) 07/13/2013    Past Surgical History:  Procedure Laterality Date  . CESAREAN SECTION  2003   with tubal ligation   . CHOLESTEATOMA EXCISION  2010   to remove a benign tumor behind R ear drum   . removal of bilateral fallopian tubes Bilateral  2012  . removal of L ovary Left 2012   due a cyst and scar tissue  . SPINE SURGERY  2012   2 surgeries: to remove a herniated disc and to remove a pus pocket at incision site   . TUBAL LIGATION    . VAGINAL HYSTERECTOMY  2007     due to fibroid cysts    Prior to Admission medications   Medication Sig Start Date End Date Taking? Authorizing Provider  alprazolam Prudy Feeler(XANAX) 2 MG tablet Take 2 mg by mouth 4 (four) times daily as needed for sleep or anxiety.     [provider]  amphetamine-dextroamphetamine (ADDERALL) 30 MG tablet Take 30 mg by mouth 3 (three) times daily.     [provider]  clindamycin (CLEOCIN) 300 MG capsule Take 1 capsule (300 mg total) by mouth 4 (four) times daily. 08/20/17 08/30/17  Enid DerryWagner, Ayn Domangue, PA-C  diclofenac (CATAFLAM) 50 MG tablet 1 tab by mouth per day or twice a day if tolerated Patient taking differently: Take 50 mg by mouth 2 (two) times daily.  06/24/16   Ewing Schleinrisp, Gregory, MD  diclofenac sodium (VOLTAREN) 1 % GEL Apply 2-4 g to painful area of skin 4 times per day if tolerated. Apply only a maximum of 4 g per application Patient not taking: Reported on 11/19/2016 04/20/16   Ewing Schleinrisp, Gregory, MD  levocetirizine (XYZAL) 5 MG tablet Take 5 mg by mouth every evening.    [provider]  levothyroxine (SYNTHROID, LEVOTHROID) 175 MCG tablet Take 175 mcg by mouth. 6 days a week    [provider]  mirabegron ER (MYRBETRIQ) 50 MG  TB24 tablet Take 1 tablet (50 mg total) by mouth daily. 11/19/16   Sebastian Ache, MD  Oxycodone HCl 10 MG TABS 1-2 tablets by mouth twice a day to 3 times a day if tolerated 06/24/16   Ewing Schlein, MD    Allergies Celecoxib; Darvon [propoxyphene]; and Gabapentin  Family History  Problem Relation Age of Onset  . Alcohol abuse Mother   . Arthritis Mother   . Depression Mother   . Hearing loss Mother   . Alcohol abuse Father   . Arthritis Father   . COPD Father   . Depression Father   . Hearing loss  Father   . Hyperlipidemia Father   . Hypertension Father     Social History Social History  Substance Use Topics  . Smoking status: Current Every Day Smoker    Packs/day: 0.50    Types: Cigarettes  . Smokeless tobacco: Never Used  . Alcohol use No     Comment: occasionally     Review of Systems  Constitutional: No fever/chills Cardiovascular: No chest pain. Respiratory: No SOB. Gastrointestinal: No abdominal pain.  No nausea, no vomiting.  Musculoskeletal: Positive for toe pain. Neurological: Negative for headaches, numbness or tingling   ____________________________________________   PHYSICAL EXAM:  VITAL SIGNS: ED Triage Vitals  Enc Vitals Group     BP 08/20/17 1636 126/86     Pulse Rate 08/20/17 1636 74     Resp 08/20/17 1636 18     Temp 08/20/17 1636 98.5 F (36.9 C)     Temp Source 08/20/17 1636 Oral     SpO2 08/20/17 1636 100 %     Weight 08/20/17 1636 163 lb (73.9 kg)     Height 08/20/17 1636 5\' 2"  (1.575 m)     Head Circumference --      Peak Flow --      Pain Score 08/20/17 1635 10     Pain Loc --      Pain Edu? --      Excl. in GC? --      Constitutional: Alert and oriented. Well appearing and in no acute distress. Eyes: Conjunctivae are normal. PERRL. EOMI. Head: Atraumatic. ENT:      Ears:      Nose: No congestion/rhinnorhea.      Mouth/Throat: Mucous membranes are moist.  Neck: No stridor. Cardiovascular: Normal rate, regular rhythm.  Good peripheral circulation. Respiratory: Normal respiratory effort without tachypnea or retractions. Lungs CTAB. Good air entry to the bases with no decreased or absent breath sounds. Gastrointestinal: Bowel sounds 4 quadrants. Soft and nontender to palpation. No guarding or rigidity. No palpable masses. No distention.  Musculoskeletal: Full range of motion to all extremities. No gross deformities appreciated. Neurologic:  Normal speech and language. No gross focal neurologic deficits are appreciated.   Skin:  Skin is warm, dry. No visible laceration. Minimal amount of adipose tissue visible under lateral side of great toenail but unable to see any wound under nail. No surrounding erythema. No drainage.    ____________________________________________   LABS (all labs ordered are listed, but only abnormal results are displayed)  Labs Reviewed - No data to display ____________________________________________  EKG   ____________________________________________  RADIOLOGY Lexine Baton, personally viewed and evaluated these images (plain radiographs) as part of my medical decision making, as well as reviewing the written report by the radiologist.   IMPRESSION:  Mild distal soft tissue swelling. No evidence of acute fracture or  other significant abnormality.  ____________________________________________    PROCEDURES  Procedure(s) performed:    Procedures    Medications - No data to display   ____________________________________________   INITIAL IMPRESSION / ASSESSMENT AND PLAN / ED COURSE  Pertinent labs & imaging results that were available during my care of the patient were reviewed by me and considered in my medical decision making (see chart for details).  Review of the [place] CSRS was performed in accordance of the [facility] prior to dispensing any controlled drugs.   Patient presented to the emergency department for evaluation of toe injury 2 days. She does have a small amount of adipose tissue visible from under nail that she will not let me remove. I am unable to see any sort of laceration. Patient will be discharged home with prescriptions for clindamycin. Patient is to follow up with podiatry this week. Patient is given ED precautions to return to the ED for any worsening or new symptoms.     ____________________________________________  FINAL CLINICAL IMPRESSION(S) / ED DIAGNOSES  Final diagnoses:  Ingrown toenail  Injury of toe on right foot,  initial encounter      NEW MEDICATIONS STARTED DURING THIS VISIT:  Discharge Medication List as of 08/20/2017  6:14 PM    START taking these medications   Details  clindamycin (CLEOCIN) 300 MG capsule Take 1 capsule (300 mg total) by mouth 4 (four) times daily., Starting Sat 08/20/2017, Until Tue 08/30/2017, Print            This chart was dictated using voice recognition software/Dragon. Despite best efforts to proofread, errors can occur which can change the meaning. Any change was purely unintentional.    Enid Derry, PA-C 08/21/17 1913    Minna Antis, MD 08/24/17 2116

## 2017-08-20 NOTE — ED Notes (Signed)
Dressing placed around big toe. Pt did dressing herself.

## 2017-08-20 NOTE — ED Triage Notes (Signed)
Stubbed R great toe 2 days ago. Split skin by nailbed.

## 2017-09-19 ENCOUNTER — Other Ambulatory Visit: Payer: Self-pay

## 2017-09-19 DIAGNOSIS — Z5321 Procedure and treatment not carried out due to patient leaving prior to being seen by health care provider: Secondary | ICD-10-CM | POA: Insufficient documentation

## 2017-09-19 DIAGNOSIS — R1030 Lower abdominal pain, unspecified: Secondary | ICD-10-CM | POA: Diagnosis present

## 2017-09-19 LAB — COMPREHENSIVE METABOLIC PANEL
ALT: 15 U/L (ref 14–54)
ANION GAP: 7 (ref 5–15)
AST: 18 U/L (ref 15–41)
Albumin: 3.7 g/dL (ref 3.5–5.0)
Alkaline Phosphatase: 61 U/L (ref 38–126)
BUN: 12 mg/dL (ref 6–20)
CHLORIDE: 108 mmol/L (ref 101–111)
CO2: 23 mmol/L (ref 22–32)
CREATININE: 0.97 mg/dL (ref 0.44–1.00)
Calcium: 8.8 mg/dL — ABNORMAL LOW (ref 8.9–10.3)
Glucose, Bld: 100 mg/dL — ABNORMAL HIGH (ref 65–99)
POTASSIUM: 3.5 mmol/L (ref 3.5–5.1)
Sodium: 138 mmol/L (ref 135–145)
Total Bilirubin: 0.4 mg/dL (ref 0.3–1.2)
Total Protein: 7.1 g/dL (ref 6.5–8.1)

## 2017-09-19 LAB — URINALYSIS, COMPLETE (UACMP) WITH MICROSCOPIC
Bacteria, UA: NONE SEEN
Bilirubin Urine: NEGATIVE
GLUCOSE, UA: NEGATIVE mg/dL
Ketones, ur: NEGATIVE mg/dL
Leukocytes, UA: NEGATIVE
NITRITE: NEGATIVE
Protein, ur: NEGATIVE mg/dL
SPECIFIC GRAVITY, URINE: 1.025 (ref 1.005–1.030)
pH: 6 (ref 5.0–8.0)

## 2017-09-19 LAB — CBC
HCT: 40.7 % (ref 35.0–47.0)
Hemoglobin: 13.9 g/dL (ref 12.0–16.0)
MCH: 31.2 pg (ref 26.0–34.0)
MCHC: 34.2 g/dL (ref 32.0–36.0)
MCV: 91.4 fL (ref 80.0–100.0)
PLATELETS: 338 10*3/uL (ref 150–440)
RBC: 4.45 MIL/uL (ref 3.80–5.20)
RDW: 14.1 % (ref 11.5–14.5)
WBC: 6.7 10*3/uL (ref 3.6–11.0)

## 2017-09-19 NOTE — ED Triage Notes (Signed)
Pt in with co supra pubic pain for a few days is on and today has had diarrhea that is black in color. States has had 10 episodes of dark stools with nausea no vomiting.

## 2017-09-20 ENCOUNTER — Emergency Department
Admission: EM | Admit: 2017-09-20 | Discharge: 2017-09-20 | Disposition: A | Discharge status: Home / Self Care | Payer: Medicaid Other | Attending: Emergency Medicine | Admitting: Emergency Medicine

## 2017-09-21 ENCOUNTER — Telehealth: Payer: Self-pay | Admitting: Emergency Medicine

## 2017-09-21 NOTE — Telephone Encounter (Signed)
Called patient due to lwot to inquire about condition and follow up plans. Left message.   

## 2017-11-18 ENCOUNTER — Ambulatory Visit: Payer: Medicaid Other

## 2017-12-09 ENCOUNTER — Encounter: Payer: Self-pay | Admitting: Urology

## 2017-12-09 ENCOUNTER — Ambulatory Visit: Payer: Medicaid Other | Admitting: Urology

## 2017-12-10 ENCOUNTER — Other Ambulatory Visit: Payer: Self-pay | Admitting: Urology

## 2017-12-10 DIAGNOSIS — N3281 Overactive bladder: Secondary | ICD-10-CM

## 2017-12-23 ENCOUNTER — Other Ambulatory Visit: Payer: Self-pay | Admitting: Urology

## 2017-12-23 DIAGNOSIS — N3281 Overactive bladder: Secondary | ICD-10-CM

## 2017-12-29 ENCOUNTER — Telehealth: Payer: Self-pay | Admitting: Urology

## 2017-12-29 DIAGNOSIS — N3941 Urge incontinence: Secondary | ICD-10-CM

## 2017-12-29 DIAGNOSIS — N3281 Overactive bladder: Secondary | ICD-10-CM

## 2017-12-29 NOTE — Telephone Encounter (Signed)
Pt would like a refill of Myrbetriq, she thinks 25 mg.  She has an appt w/Shannon next week.  Please give pt a call.

## 2018-01-02 ENCOUNTER — Encounter: Payer: Self-pay | Admitting: Urology

## 2018-01-02 ENCOUNTER — Ambulatory Visit: Payer: Medicaid Other | Admitting: Urology

## 2018-01-02 VITALS — BP 134/88 | HR 75 | Ht 62.0 in | Wt 189.0 lb

## 2018-01-02 DIAGNOSIS — Z87448 Personal history of other diseases of urinary system: Secondary | ICD-10-CM | POA: Diagnosis not present

## 2018-01-02 DIAGNOSIS — R102 Pelvic and perineal pain: Secondary | ICD-10-CM

## 2018-01-02 DIAGNOSIS — N3941 Urge incontinence: Secondary | ICD-10-CM

## 2018-01-02 DIAGNOSIS — N3281 Overactive bladder: Secondary | ICD-10-CM

## 2018-01-02 LAB — BLADDER SCAN AMB NON-IMAGING: SCAN RESULT: 0

## 2018-01-02 MED ORDER — MIRABEGRON ER 50 MG PO TB24: 50.0000 mg | ORAL_TABLET | Freq: Every day | ORAL | 3 refills | Status: DC

## 2018-01-02 NOTE — Telephone Encounter (Signed)
Medication refilled

## 2018-01-02 NOTE — Addendum Note (Signed)
Addended by: Skeet LatchWATKINS, CHELSEA C on: 01/02/2018 04:16 PM   Modules accepted: Orders

## 2018-01-02 NOTE — Progress Notes (Signed)
01/02/2018 10:44 AM   [name] Jul 13, 1976 [id number]  Referring provider: Hillery Aldo, MD [address] [place], [place] [address]  CC: Follow up mixed incontinence, hematuria, pelvic pain with PVR  HPI:  1 - Urge Predominant Mixed Incontinence - on Myrbetriq 50mg  QD for urge predom mixed incontinence with good control. Prior anticholinergics did not meet goals. Stress leakage is minimal / non-bothersome.  Has been without the Myrbetriq for about one month due to miscommunications with the office.  She has definitively noted a worsening of her symptoms without the medication.   She is having frequency, urgency, nocturia x2 and incontinence of the urge and stress variety, intermittency, hesitancy and a weak urinary stream.  She only drinks 8 ounces of water daily.  She is drinking 3 cups of coffee daily and two 20 ounces of regular Coke.  She is not drinking juices or tea.  She does drink an occasional Gatorade.  She is also a smoker.  Her PVR is 0 mL.    2 - History of hematuria - Negative eval 2017 with CT and Cysto. No gross episodes.  3 - Chronic Pelvic Pain - long h/o chronic pain (chest pain, back pain, pelvic pain) of non-organic etiology. She has had hysterectomy for this indication as well.  Pelvic exam, PVR, imaging unremarkable x several. Manages with PRN vaginal valium.   Insurance will not cover PT.    PMH: Past Medical History:  Diagnosis Date  . Asthma   . Bleeding hemorrhoid   . GERD (gastroesophageal reflux disease)   . Hypothyroidism   . Seizures [facility])     Surgical History: Past Surgical History:  Procedure Laterality Date  . CESAREAN SECTION  2003   with tubal ligation   . CHOLESTEATOMA EXCISION  2010   to remove a benign tumor behind R ear drum   . removal of bilateral fallopian tubes Bilateral 2012  . removal of L ovary Left 2012   due a cyst and scar tissue  . SPINE SURGERY  2012   2 surgeries: to remove a herniated disc and to remove  a pus pocket at incision site   . TUBAL LIGATION    . VAGINAL HYSTERECTOMY  2007     due to fibroid cysts    Home Medications:  Allergies as of 01/02/2018      Reactions   Celecoxib Other (See Comments)   Darvon [propoxyphene] Hypertension   Other reaction(s): Unknown Heart race   Gabapentin Rash      Medication List        Accurate as of 01/02/18 10:44 AM. Always use your most recent med list.          amphetamine-dextroamphetamine 30 MG tablet Commonly known as:  ADDERALL Take 30 mg by mouth 3 (three) times daily.   diclofenac 50 MG tablet Commonly known as:  CATAFLAM 1 tab by mouth per day or twice a day if tolerated   diclofenac sodium 1 % Gel Commonly known as:  VOLTAREN Apply 2-4 g to painful area of skin 4 times per day if tolerated. Apply only a maximum of 4 g per application   levocetirizine 5 MG tablet Commonly known as:  XYZAL Take 5 mg by mouth every evening.   levothyroxine 175 MCG tablet Commonly known as:  SYNTHROID, LEVOTHROID Take 175 mcg by mouth. 6 days a week   mirabegron ER 50 MG Tb24 tablet Commonly known as:  MYRBETRIQ Take 1 tablet (50 mg total) by  mouth daily.   Oxycodone HCl 10 MG Tabs 1-2 tablets by mouth twice a day to 3 times a day if tolerated       Allergies:  Allergies  Allergen Reactions  . Celecoxib Other (See Comments)  . Darvon [Propoxyphene] Hypertension    Other reaction(s): Unknown Heart race  . Gabapentin Rash    Family History: Family History  Problem Relation Age of Onset  . Alcohol abuse Mother   . Arthritis Mother   . Depression Mother   . Hearing loss Mother   . Alcohol abuse Father   . Arthritis Father   . COPD Father   . Depression Father   . Hearing loss Father   . Hyperlipidemia Father   . Hypertension Father     Social History:  reports that she has been smoking cigarettes.  She has been smoking about 0.50 packs per day. she has never used smokeless tobacco. She reports that she does not  drink alcohol or use drugs.    Review of Systems UROLOGY Frequent Urination?: Yes Hard to postpone urination?: Yes Burning/pain with urination?: No Get up at night to urinate?: Yes Leakage of urine?: Yes Urine stream starts and stops?: Yes Trouble starting stream?: Yes Do you have to strain to urinate?: No Blood in urine?: No Urinary tract infection?: No Sexually transmitted disease?: No Injury to kidneys or bladder?: No Painful intercourse?: Yes Weak stream?: Yes Currently pregnant?: No Vaginal bleeding?: No Last menstrual period?: n Gastrointestinal Nausea?: Yes Vomiting?: Yes Indigestion/heartburn?: Yes Diarrhea?: No Constipation?: No Constitutional Fever: No Night sweats?: No Weight loss?: No Fatigue?: Yes Skin Skin rash/lesions?: No Itching?: No Eyes Blurred vision?: No Double vision?: No Ears/Nose/Throat Sore throat?: No Sinus problems?: Yes Hematologic/Lymphatic Swollen glands?: No Easy bruising?: Yes Cardiovascular Leg swelling?: No Chest pain?: No Respiratory Cough?: Yes Shortness of breath?: No Endocrine Excessive thirst?: No Musculoskeletal Back pain?: Yes Joint pain?: No Neurological Headaches?: Yes Dizziness?: Yes Psychologic Depression?: Yes Anxiety?: Yes   Physical Exam: BP 134/88   Pulse 75   Ht 5\' 2"  (1.575 m)   Wt 189 lb (85.7 kg)   BMI 34.57 kg/m   Constitutional: Well nourished. Alert and oriented, No acute distress. HEENT: [place] AT, moist mucus membranes. Trachea midline, no masses. Cardiovascular: No clubbing, cyanosis, or edema. Respiratory: Normal respiratory effort, no increased work of breathing. GI: Abdomen is soft, non tender, non distended, no abdominal masses. Liver and spleen not palpable.  No hernias appreciated.  Stool sample for occult testing is not indicated.   GU: No CVA tenderness.  No bladder fullness or masses.  Normal external genitalia, normal pubic hair distribution, no lesions.  Normal urethral  meatus, no lesions, no prolapse, no discharge.   No urethral masses, tenderness and/or tenderness. No bladder fullness, tenderness or masses. Normal vagina mucosa, good estrogen effect, no discharge, no lesions, good pelvic support, Grade II cystocele noted.  No rectocele noted.  Cervix and uterus are surgically absent.  No adnexal/parametria masses or tenderness noted.  Anus and perineum are without rashes or lesions.    Skin: No rashes, bruises or suspicious lesions. Lymph: No cervical or inguinal adenopathy. Neurologic: Grossly intact, no focal deficits, moving all 4 extremities. Psychiatric: Normal mood and affect.  Laboratory Data: Lab Results  Component Value Date   WBC 6.7 09/19/2017   HGB 13.9 09/19/2017   HCT 40.7 09/19/2017   MCV 91.4 09/19/2017   PLT 338 09/19/2017    Lab Results  Component Value Date   CREATININE 0.97  09/19/2017    No results found for: PSA  No results found for: TESTOSTERONE  Lab Results  Component Value Date   HGBA1C 5.2 02/12/2017    Urinalysis    Component Value Date/Time   COLORURINE YELLOW (A) 09/19/2017 2319   APPEARANCEUR CLEAR (A) 09/19/2017 2319   APPEARANCEUR Cloudy (A) 11/19/2016 1039   LABSPEC 1.025 09/19/2017 2319   LABSPEC 1.018 10/17/2013 2032   PHURINE 6.0 09/19/2017 2319   GLUCOSEU NEGATIVE 09/19/2017 2319   GLUCOSEU Negative 10/17/2013 2032   HGBUR SMALL (A) 09/19/2017 2319   BILIRUBINUR NEGATIVE 09/19/2017 2319   BILIRUBINUR Negative 11/19/2016 1039   BILIRUBINUR Negative 10/17/2013 2032   KETONESUR NEGATIVE 09/19/2017 2319   PROTEINUR NEGATIVE 09/19/2017 2319   UROBILINOGEN 0.2 12/29/2014 1738   NITRITE NEGATIVE 09/19/2017 2319   LEUKOCYTESUR NEGATIVE 09/19/2017 2319   LEUKOCYTESUR Trace (A) 11/19/2016 1039   LEUKOCYTESUR 2+ 10/17/2013 2032   I have reviewed the labs.  Pertinent Imaging: Results for RINDA, ROLLYSON (MRN [id number]) as of 01/02/2018 10:16  Ref. Range 01/02/2018 00:00  Scan Result Unknown 0     Assessment & Plan:    1 - Urge Predominant Mixed Incontinence - continue Myrbetriq for urge component.  Refills given.  Would consider BID Cymbalta 20 for stress component (may also help with chronic pain) should that become more bothersome in future..   2 - Microscopic Hematuria - Reinforced need to repeat eval for future gross / visible episodes only. She voiced understanding. No gross episodes.    3 - Chronic Pelvic Pain - this is clearly another manifestation of underlying chronic pain syndrome. Not indication for narcotics.  Improved with Myrbetriq.  Continue vag valium PRN, again consider BID cymbalta dual indication as per above in future.  RTC 1 year with PVR / med refills.   Michiel Cowboy, PA-C  [facility] [address], [address] [place], [place] [address] [phone]

## 2018-01-06 ENCOUNTER — Ambulatory Visit: Payer: Self-pay

## 2018-04-04 ENCOUNTER — Telehealth: Payer: Self-pay

## 2018-04-04 NOTE — Telephone Encounter (Signed)
Prior auth started for Myrbetriq 50mg  through [employer]. Spoke w/Patty #[id number] prior auth sent to review , clinical review #[id number]

## 2018-12-19 ENCOUNTER — Other Ambulatory Visit: Payer: Self-pay | Admitting: Family Medicine

## 2018-12-19 DIAGNOSIS — N6452 Nipple discharge: Secondary | ICD-10-CM

## 2018-12-20 ENCOUNTER — Ambulatory Visit
Admission: RE | Admit: 2018-12-20 | Discharge: 2018-12-20 | Disposition: A | Discharge status: Home / Self Care | Payer: Medicaid Other | Source: Ambulatory Visit | Attending: Family Medicine | Admitting: Family Medicine

## 2018-12-20 DIAGNOSIS — N6452 Nipple discharge: Secondary | ICD-10-CM

## 2019-01-01 NOTE — Progress Notes (Signed)
01/02/2019 9:51 AM   Gala Murdoch 03-Sep-1976 [id number]  Referring provider: Hillery Aldo, MD [address] [place], [place] [address]  Chief Complaint  Patient presents with  . Over Active Bladder  . Hematuria    HPI: GENINE MCILVAIN is a 43 y.o. female Caucasian with urge predominant mixed incontinence, chronic pelvic pain, and a history of hematuria who reports today for an annual follow up.  Urge Predominant Mixed Incontinence Patient has been on Myrbetriq 50mg  QD for urge predom mixed incontinence with good control.  Prior anticholinergics did not meet goals.  Stress leakage has been minimal / non-bothersome.  On the patient's 01/02/2018 visit, she had been without the Myrbetriq for about one month due to miscommunications with the office.  She had definitively noted a worsening of her symptoms without the medication.   She was having frequency, urgency, nocturia x2 and incontinence of the urge and stress variety, intermittency, hesitancy and a weak urinary stream.  At that time, she only drank 8 ounces of water daily, as well as 3 cups of coffee daily and two 20 ounces of regular Coke.  She was not drinking juices or tea.  She did drink an occasional Gatorade.  She was also a smoker.  Her PVR on that visit was 0 mL.    The patient is today (01/02/2019) experiencing urgency x 0-3, frequency x >8, not restricting fluids to avoid visits to the restroom 0-3, is engaging in toilet mapping, incontinence x 0-3 and nocturia x 0-3.   Her BP is 113/76.   Her PVR is 0 mL.  She reports that she feels like the Myrbetriq works; it has significantly improved the pain.  She says she urinates during the day at least once every 30-55min, and she does not feel full until it starts hurting.  Her nocturia is down to once a night, depending on when she goes to bed.  She leaks 'a little bit' if she coughs or sneezes, but usually it is caused by waiting too long.  She says that her  intermittency feels like she is 'no longer full' but she knows she has not completely emptied.  She says that her stream is better at night.  She does not drink water on a daily basis.  She denies coffee.  She drinks 1.5 20oz bottles of Coke a day.  She denies juice and tea.  She's not been drinking Gatorade lately, as it has been too cold to walk.  She drinks alcohol on rare occasions.  Patient denies any gross hematuria, burning, or dysuria.  Patient admits 'tingling' on urination, like fingertips going numb, though no burning; she noticed it about a month or two ago, and is intermittent.  Patient admits to having a history of stones, with the last one being 'a few years' ago with the last one needing medical treatment around 6 years ago, and that it did have symptoms like this.  UA today is nitrite positive with many bacteria.    History of hematuria Negative eval 2017 with CT and Cysto. No gross episodes. UA today is negative for AMH.   Chronic Pelvic Pain Long h/o chronic pain (chest pain, back pain, pelvic pain) of non-organic etiology.  She has had hysterectomy for this indication as well.  Pelvic exam, PVR, imaging unremarkable x several.  Insurance will not cover PT.  PMH: Past Medical History:  Diagnosis Date  . Asthma   . Bleeding hemorrhoid   . GERD (gastroesophageal  reflux disease)   . Hypothyroidism   . Seizures [facility])     Surgical History: Past Surgical History:  Procedure Laterality Date  . CESAREAN SECTION  2003   with tubal ligation   . CHOLESTEATOMA EXCISION  2010   to remove a benign tumor behind R ear drum   . removal of bilateral fallopian tubes Bilateral 2012  . removal of L ovary Left 2012   due a cyst and scar tissue  . SPINE SURGERY  2012   2 surgeries: to remove a herniated disc and to remove a pus pocket at incision site   . TUBAL LIGATION    . VAGINAL HYSTERECTOMY  2007     due to fibroid cysts    Home Medications:  Allergies as of 01/02/2019       Reactions   Celecoxib Other (See Comments)   Darvon [propoxyphene] Hypertension   Other reaction(s): Unknown Heart race   Gabapentin Rash      Medication List       Accurate as of January 02, 2019  9:51 AM. Always use your most recent med list.        amphetamine-dextroamphetamine 30 MG tablet Commonly known as:  ADDERALL Take 30 mg by mouth 3 (three) times daily.   diclofenac 50 MG tablet Commonly known as:  CATAFLAM 1 tab by mouth per day or twice a day if tolerated   diclofenac sodium 1 % Gel Commonly known as:  Voltaren Apply 2-4 g to painful area of skin 4 times per day if tolerated. Apply only a maximum of 4 g per application   doxepin 25 MG capsule Commonly known as:  SINEQUAN   levocetirizine 5 MG tablet Commonly known as:  XYZAL Take 5 mg by mouth every evening.   levothyroxine 175 MCG tablet Commonly known as:  SYNTHROID, LEVOTHROID Take 175 mcg by mouth. 6 days a week   mirabegron ER 50 MG Tb24 tablet Commonly known as:  Myrbetriq Take 1 tablet (50 mg total) by mouth daily.   Oxycodone HCl 10 MG Tabs 1-2 tablets by mouth twice a day to 3 times a day if tolerated       Allergies:  Allergies  Allergen Reactions  . Celecoxib Other (See Comments)  . Darvon [Propoxyphene] Hypertension    Other reaction(s): Unknown Heart race  . Gabapentin Rash    Family History: Family History  Problem Relation Age of Onset  . Alcohol abuse Mother   . Arthritis Mother   . Depression Mother   . Hearing loss Mother   . Alcohol abuse Father   . Arthritis Father   . COPD Father   . Depression Father   . Hearing loss Father   . Hyperlipidemia Father   . Hypertension Father   . Breast cancer Paternal Grandmother     Social History:  reports that she has been smoking cigarettes. She has been smoking about 0.50 packs per day. She has never used smokeless tobacco. She reports that she does not drink alcohol or use drugs.  Review of Systems UROLOGY Frequent  Urination?: Yes Hard to postpone urination?: Yes Burning/pain with urination?: No Get up at night to urinate?: Yes Leakage of urine?: Yes Urine stream starts and stops?: Yes Trouble starting stream?: Yes Do you have to strain to urinate?: No Blood in urine?: No Urinary tract infection?: No Sexually transmitted disease?: No Injury to kidneys or bladder?: No Painful intercourse?: No Weak stream?: Yes Currently pregnant?: No Vaginal bleeding?: No Last menstrual  period?: n Gastrointestinal Nausea?: No Vomiting?: No Indigestion/heartburn?: No Diarrhea?: No Constipation?: No Constitutional Fever: No Night sweats?: No Weight loss?: No Fatigue?: Yes Skin Skin rash/lesions?: No Itching?: No Eyes Blurred vision?: No Double vision?: No Ears/Nose/Throat Sore throat?: No Sinus problems?: No Hematologic/Lymphatic Swollen glands?: No Easy bruising?: No Cardiovascular Leg swelling?: No Chest pain?: No Respiratory Cough?: No Shortness of breath?: No Endocrine Excessive thirst?: Yes Musculoskeletal Back pain?: Yes Joint pain?: Yes Neurological Headaches?: Yes Dizziness?: No Psychologic Depression?: Yes Anxiety?: Yes   Physical Exam: BP 113/76   Pulse 90   Ht  (1.575 m)   Wt 191 lb 4.8 oz (86.8 kg)   SpO2 97%   BMI 34.99 kg/m   Constitutional: Well nourished. Alert and oriented, No acute distress. Piercing's in left eyebrow and nose HEENT: [place] AT, moist mucus membranes.  Trachea midline.   Cardiovascular: No clubbing, cyanosis, or edema. Respiratory: Normal respiratory effort, no increased work of breathing. Skin: No rashes, bruises or suspicious lesions. Neurologic: Grossly intact, no focal deficits, moving all 4 extremities. Psychiatric: Normal mood and affect.   Laboratory Data:  Urinalysis Many bacteria, cloudy, BIL 1+. BLO 1+, NIT positive.  See Epic.  I have reviewed the labs.  Pertinent Imaging:  Results for orders placed or performed in  visit on 01/02/19  Bladder Scan (Post Void Residual) in office  Result Value Ref Range   Scan Result 0ml     Assessment & Plan:    1. Urge Predominant Mixed Incontinence - Continue Myrbetriq for urge component; refills given.  2. History of hematuria - Reinforced need to repeat eval for future gross / visible episodes only. She voiced understanding. - No gross episodes.  3 - Chronic Pelvic Pain - This is clearly another manifestation of underlying chronic pain syndrome. Not indication for narcotics.  Improved with Myrbetriq.  4. Dysuria UA suspicious for infection, will send for culture, will hold antibiotic until sensitivities are available  5. History of stones - Patient declined a KUB; she is not finding the possible stone symptoms bothersome enough.   Return in about 1 year (around 01/02/2020) for PVR and OAB questionnaire.  Michiel Cowboy, PA-C  [facility] [address], [address] [place], [place] [address] [phone]  I, Duanne Moron, am acting as a scribe for [facility], PA-C   I have reviewed the above documentation for accuracy and completeness, and I agree with the above.    Michiel Cowboy, PA-C

## 2019-01-02 ENCOUNTER — Encounter: Payer: Self-pay | Admitting: Urology

## 2019-01-02 ENCOUNTER — Other Ambulatory Visit: Payer: Self-pay

## 2019-01-02 ENCOUNTER — Ambulatory Visit: Payer: Medicaid Other | Admitting: Urology

## 2019-01-02 VITALS — BP 113/76 | HR 90 | Ht 62.0 in | Wt 191.3 lb

## 2019-01-02 DIAGNOSIS — R300 Dysuria: Secondary | ICD-10-CM

## 2019-01-02 DIAGNOSIS — Z87448 Personal history of other diseases of urinary system: Secondary | ICD-10-CM | POA: Diagnosis not present

## 2019-01-02 DIAGNOSIS — Z87442 Personal history of urinary calculi: Secondary | ICD-10-CM

## 2019-01-02 DIAGNOSIS — N3941 Urge incontinence: Secondary | ICD-10-CM

## 2019-01-02 DIAGNOSIS — N3281 Overactive bladder: Secondary | ICD-10-CM | POA: Diagnosis not present

## 2019-01-02 LAB — BLADDER SCAN AMB NON-IMAGING

## 2019-01-02 MED ORDER — MIRABEGRON ER 50 MG PO TB24: 50.0000 mg | ORAL_TABLET | Freq: Every day | ORAL | 3 refills | Status: DC

## 2019-01-03 LAB — URINALYSIS, COMPLETE
BILIRUBIN UA: NEGATIVE
Glucose, UA: NEGATIVE
Ketones, UA: NEGATIVE
Leukocytes, UA: NEGATIVE
Nitrite, UA: POSITIVE — AB
PH UA: 5.5 (ref 5.0–7.5)
Protein, UA: NEGATIVE
Specific Gravity, UA: >1.03 — ABNORMAL HIGH (ref 1.005–1.030)
UUROB: 0.2 mg/dL (ref 0.2–1.0)

## 2019-01-03 LAB — MICROSCOPIC EXAMINATION

## 2019-01-04 LAB — CULTURE, URINE COMPREHENSIVE

## 2019-01-05 ENCOUNTER — Telehealth: Payer: Self-pay

## 2019-01-05 MED ORDER — SULFAMETHOXAZOLE-TRIMETHOPRIM 800-160 MG PO TABS: 1.0000 | ORAL_TABLET | Freq: Two times a day (BID) | ORAL | 0 refills | Status: AC

## 2019-01-05 NOTE — Telephone Encounter (Signed)
Advised patient of results and antibiotic sent to pharmacy per [facility]. Patient verbalized understanding.

## 2019-01-05 NOTE — Telephone Encounter (Signed)
-----   Message from Harle Battiest, PA-C sent at 01/05/2019  8:02 AM EDT ----- Please let Mrs. Mato know that her urine culture is positive for infection.  She needs to start Septra DS, BID x 7 days.

## 2019-04-02 ENCOUNTER — Telehealth: Payer: Self-pay | Admitting: Urology

## 2019-04-02 DIAGNOSIS — N3941 Urge incontinence: Secondary | ICD-10-CM

## 2019-04-02 DIAGNOSIS — N3281 Overactive bladder: Secondary | ICD-10-CM

## 2019-04-02 NOTE — Telephone Encounter (Signed)
Pt LMOM stated her pharmacy needs PA for Myrbetriq 50 mg.  She uses [facility] [phone].

## 2019-04-03 ENCOUNTER — Telehealth: Payer: Self-pay | Admitting: Family Medicine

## 2019-04-03 NOTE — Telephone Encounter (Signed)
Patient notified Myrbetriq 50 mg has been approved. Approval code [id number] approved from 04/03/2019-03/28/2020

## 2019-04-03 NOTE — Telephone Encounter (Signed)
Spoke with Pharmacy-will send PA electronically and re-fax.

## 2019-10-17 ENCOUNTER — Ambulatory Visit (INDEPENDENT_AMBULATORY_CARE_PROVIDER_SITE_OTHER): Payer: Medicaid Other

## 2019-10-17 ENCOUNTER — Ambulatory Visit: Payer: Medicaid Other | Admitting: Podiatry

## 2019-10-17 ENCOUNTER — Encounter: Payer: Self-pay | Admitting: Podiatry

## 2019-10-17 ENCOUNTER — Other Ambulatory Visit: Payer: Self-pay

## 2019-10-17 VITALS — BP 119/83 | HR 89 | Resp 16

## 2019-10-17 DIAGNOSIS — M722 Plantar fascial fibromatosis: Secondary | ICD-10-CM

## 2019-10-17 MED ORDER — METHYLPREDNISOLONE 4 MG PO TBPK: ORAL_TABLET | ORAL | 0 refills | Status: DC

## 2019-10-17 NOTE — Progress Notes (Signed)
Subjective:  Patient ID: [name], female    DOB: 09/02/1976,  MRN: [id number] HPI Chief Complaint  Patient presents with  . Foot Pain    Plantar/posterior heel bilateral - aching x few months, AM pain, goes to pain clinic, takes Oxycodone 10mg  (50-60mg  daily) - doesn't help heel  . Foot Pain    Plantar forefoot left - callused lesions, treated before, but now feels like a knot  . New Patient (Initial Visit)    43 y.o. female presents with the above complaint.   ROS: Denies fever chills nausea vomiting muscle pain chest pain shortness of breath.  Chronic back pain and sees pain clinic for that.  Past Medical History:  Diagnosis Date  . Asthma   . Bleeding hemorrhoid   . GERD (gastroesophageal reflux disease)   . Hypothyroidism   . Seizures (HCC)    Past Surgical History:  Procedure Laterality Date  . CESAREAN SECTION  2003   with tubal ligation   . CHOLESTEATOMA EXCISION  2010   to remove a benign tumor behind R ear drum   . removal of bilateral fallopian tubes Bilateral 2012  . removal of L ovary Left 2012   due a cyst and scar tissue  . SPINE SURGERY  2012   2 surgeries: to remove a herniated disc and to remove a pus pocket at incision site   . TUBAL LIGATION    . VAGINAL HYSTERECTOMY  2007     due to fibroid cysts    Current Outpatient Medications:  .  amphetamine-dextroamphetamine (ADDERALL) 30 MG tablet, Take 30 mg by mouth 3 (three) times daily. , Disp: , Rfl:  .  conjugated estrogens (PREMARIN) vaginal cream, Premarin 0.625 mg/gram vaginal cream  INSERT 1 GRAM VAGINALLY NIGHTLY AT BEDTIME 3 TIMES PER WEEK, Disp: , Rfl:  .  Cyanocobalamin 1000 MCG SUBL, cyanocobalamin (vit B-12) 1,000 mcg sublingual tablet, Disp: , Rfl:  .  diclofenac (CATAFLAM) 50 MG tablet, 1 tab by mouth per day or twice a day if tolerated (Patient taking differently: Take 50 mg by mouth 2 (two) times daily. ), Disp: 60 tablet, Rfl: 2 .  diclofenac sodium (VOLTAREN) 1 % GEL, Apply 2-4 g  to painful area of skin 4 times per day if tolerated. Apply only a maximum of 4 g per application, Disp: 500 g, Rfl: 0 .  doxepin (SINEQUAN) 25 MG capsule, , Disp: , Rfl:  .  levocetirizine (XYZAL) 5 MG tablet, Take 5 mg by mouth every evening., Disp: , Rfl:  .  levothyroxine (SYNTHROID, LEVOTHROID) 175 MCG tablet, Take 175 mcg by mouth. 6 days a week, Disp: , Rfl:  .  lidocaine (XYLOCAINE) 5 % ointment, lidocaine 5 % topical ointment  Apply 1-2 grams TO affected AREA 3 TO 4 TIMES daily AS directed., Disp: , Rfl:  .  mirabegron ER (MYRBETRIQ) 50 MG TB24 tablet, Take 1 tablet (50 mg total) by mouth daily., Disp: 90 tablet, Rfl: 3 .  omeprazole (PRILOSEC) 40 MG capsule, omeprazole 40 mg capsule,delayed release  TAKE ONE CAPSULE BY MOUTH EVERY DAY FOR REFLUX, Disp: , Rfl:  .  oxyCODONE-acetaminophen (PERCOCET) 10-325 MG tablet, Percocet 10 mg-325 mg tablet  Take 1 tablet every 6 hours by oral route., Disp: , Rfl:   Allergies  Allergen Reactions  . Celecoxib Other (See Comments)  . Darvon [Propoxyphene] Hypertension    Other reaction(s): Unknown Heart race  . Gabapentin Rash   Review of Systems Objective:   Vitals:   10/17/19  0907  BP: 119/83  Pulse: 89  Resp: 16    General: Well developed, nourished, in no acute distress, alert and oriented x3   Dermatological: Skin is warm, dry and supple bilateral. Nails x 10 are well maintained; remaining integument appears unremarkable at this time. There are no open sores, no preulcerative lesions, no rash or signs of infection present.  Vascular: Dorsalis Pedis artery and Posterior Tibial artery pedal pulses are 2/4 bilateral with immedate capillary fill time. Pedal hair growth present. No varicosities and no lower extremity edema present bilateral.   Neruologic: Grossly intact via light touch bilateral. Vibratory intact via tuning fork bilateral. Protective threshold with Semmes Wienstein monofilament intact to all pedal sites bilateral. Patellar  and Achilles deep tendon reflexes 2+ bilateral. No Babinski or clonus noted bilateral.   Musculoskeletal: No gross boney pedal deformities bilateral. No pain, crepitus, or limitation noted with foot and ankle range of motion bilateral. Muscular strength 5/5 in all groups tested bilateral.  She has pain on palpation medial calcaneal tubercle bilateral.  No pain on medial and lateral compression of the calcaneus.  Gait: Unassisted, Nonantalgic.    Radiographs:  Radiographs taken today demonstrate osseously mature foot bilaterally mild pes planus is noted plantar distally oriented calcaneal spurs noted soft tissue increase in density plantar fashion calcaneal insertion sites noted bilaterally.  Assessment & Plan:   Assessment: Plantar fasciitis bilateral.  Plan: Discussed etiology pathology conservative versus surgical therapies.  At this point injected the right heel with 20 mg Kenalog 5 mg of Marcaine she did not want me to inject the left heel.  Start her on a Medrol Dosepak during this time she will stop her diclofenac.  She will restart it once the Medrol has been completed.  Placed her in a plantar fascial brace bilaterally.  Discussed appropriate shoe gear stretching exercises ice therapy and shoe gear modifications.  I will follow-up with her in 1 month.     Ladarryl Wrage T. [place], [place]

## 2019-10-17 NOTE — Patient Instructions (Signed)

## 2019-11-14 ENCOUNTER — Ambulatory Visit: Payer: Medicaid Other | Admitting: Podiatry

## 2019-12-26 ENCOUNTER — Ambulatory Visit: Payer: Self-pay | Admitting: Urology

## 2020-01-02 ENCOUNTER — Ambulatory Visit: Payer: Medicaid Other | Admitting: Urology

## 2020-01-02 ENCOUNTER — Encounter: Payer: Self-pay | Admitting: Urology

## 2020-01-02 ENCOUNTER — Other Ambulatory Visit: Payer: Self-pay

## 2020-01-02 VITALS — BP 128/74 | HR 72 | Ht 61.0 in | Wt 209.0 lb

## 2020-01-02 DIAGNOSIS — N3281 Overactive bladder: Secondary | ICD-10-CM | POA: Diagnosis not present

## 2020-01-02 DIAGNOSIS — N3941 Urge incontinence: Secondary | ICD-10-CM

## 2020-01-02 LAB — BLADDER SCAN AMB NON-IMAGING: Scan Result: 19

## 2020-01-02 MED ORDER — PREMARIN 0.625 MG/GM VA CREA: TOPICAL_CREAM | VAGINAL | 3 refills | Status: DC

## 2020-01-02 MED ORDER — MIRABEGRON ER 50 MG PO TB24: 50.0000 mg | ORAL_TABLET | Freq: Every day | ORAL | 3 refills | Status: DC

## 2020-01-02 NOTE — Progress Notes (Signed)
01/02/2020 12:05 PM   [name] 1975/11/26 [id number]  Referring provider: Denton Lank, MD [address] [place],  [place] [address]  Chief Complaint  Patient presents with  . Urinary Incontinence    HPI: [name] is a 44 year old with mixed incontinence, chronic pelvic pain and a history of hematuria who reports today for annual follow-up.  Mixed incontinence The patient is  experiencing urgency x 4-7 (worse), frequency x 8 or more (stable), is restricting fluids to avoid visits to the restroom, is engaging in toilet mapping, incontinence x 0-3 (stable) and nocturia x 0-3 (stable).   Her BP is 128/74.   Her PVR is 19 mL.  At goal with Myrbetriq 50 mg daily.    Chronic pelvic pain Long h/o chronic pain (chest pain, back pain, pelvic pain) of non-organic etiology.  She has had hysterectomy for this indication as well.  Pelvic exam, PVR, imaging unremarkable x several.  Insurance will not cover PT.  History of hematuria (high risk) Smoker.  Negative eval 2017 with CT and Cysto.  No reports of gross hematuria.  UA today is negative for microscopic hematuria.   PMH: Past Medical History:  Diagnosis Date  . Asthma   . Bleeding hemorrhoid   . GERD (gastroesophageal reflux disease)   . Hypothyroidism   . Seizures [facility])     Surgical History: Past Surgical History:  Procedure Laterality Date  . CESAREAN SECTION  2003   with tubal ligation   . CHOLESTEATOMA EXCISION  2010   to remove a benign tumor behind R ear drum   . removal of bilateral fallopian tubes Bilateral 2012  . removal of L ovary Left 2012   due a cyst and scar tissue  . SPINE SURGERY  2012   2 surgeries: to remove a herniated disc and to remove a pus pocket at incision site   . TUBAL LIGATION    . VAGINAL HYSTERECTOMY  2007     due to fibroid cysts    Home Medications:  Allergies as of 01/02/2020      Reactions   Celecoxib Other (See Comments)   Darvon [propoxyphene] Hypertension   Other reaction(s): Unknown Heart race   Gabapentin Rash      Medication List       Accurate as of January 02, 2020 12:05 PM. If you have any questions, ask your nurse or doctor.        amphetamine-dextroamphetamine 30 MG tablet Commonly known as: ADDERALL Take 30 mg by mouth 3 (three) times daily.   Cyanocobalamin 1000 MCG Subl cyanocobalamin (vit B-12) 1,000 mcg sublingual tablet   diclofenac 50 MG tablet Commonly known as: CATAFLAM 1 tab by mouth per day or twice a day if tolerated What changed:   how much to take  how to take this  when to take this  additional instructions   diclofenac sodium 1 % Gel Commonly known as: Voltaren Apply 2-4 g to painful area of skin 4 times per day if tolerated. Apply only a maximum of 4 g per application   doxepin 25 MG capsule Commonly known as: SINEQUAN   levocetirizine 5 MG tablet Commonly known as: XYZAL Take 5 mg by mouth every evening.   levothyroxine 175 MCG tablet Commonly known as: SYNTHROID Take 175 mcg by mouth. 6 days a week   lidocaine 5 % ointment Commonly known as: XYLOCAINE lidocaine 5 % topical ointment  Apply 1-2 grams TO affected AREA 3 TO 4  TIMES daily AS directed.   methylPREDNISolone 4 MG Tbpk tablet Commonly known as: MEDROL DOSEPAK 6 day dose pack - take as directed   mirabegron ER 50 MG Tb24 tablet Commonly known as: Myrbetriq Take 1 tablet (50 mg total) by mouth daily.   omeprazole 40 MG capsule Commonly known as: PRILOSEC omeprazole 40 mg capsule,delayed release  TAKE ONE CAPSULE BY MOUTH EVERY DAY FOR REFLUX   Percocet 10-325 MG tablet Generic drug: oxyCODONE-acetaminophen Percocet 10 mg-325 mg tablet  Take 1 tablet every 6 hours by oral route.   Premarin vaginal cream Generic drug: conjugated estrogens Premarin 0.625 mg/gram vaginal cream  INSERT 1 GRAM VAGINALLY NIGHTLY AT BEDTIME 3 TIMES PER WEEK       Allergies:  Allergies  Allergen Reactions  . Celecoxib Other (See  Comments)  . Darvon [Propoxyphene] Hypertension    Other reaction(s): Unknown Heart race  . Gabapentin Rash    Family History: Family History  Problem Relation Age of Onset  . Alcohol abuse Mother   . Arthritis Mother   . Depression Mother   . Hearing loss Mother   . Alcohol abuse Father   . Arthritis Father   . COPD Father   . Depression Father   . Hearing loss Father   . Hyperlipidemia Father   . Hypertension Father   . Breast cancer Paternal Grandmother     Social History:  reports that she has been smoking cigarettes. She has been smoking about 0.50 packs per day. She has never used smokeless tobacco. She reports that she does not drink alcohol or use drugs.  ROS: Pertinent ROS in HPI  Physical Exam: BP 128/74   Pulse 72   Ht 5\' 1"  (1.549 m)   Wt 209 lb (94.8 kg)   BMI 39.49 kg/m   Constitutional:  Well nourished. Alert and oriented, No acute distress. HEENT: [place] [facility] AT, mask in place.  Trachea midline, no masses. Cardiovascular: No clubbing, cyanosis, or edema. Respiratory: Normal respiratory effort, no increased work of breathing. GI: Abdomen is soft, non tender, non distended, no abdominal masses.  Neurologic: Grossly intact, no focal deficits, moving all 4 extremities. Psychiatric: Normal mood and affect.  Laboratory Data: Lab Results  Component Value Date   WBC 6.7 09/19/2017   HGB 13.9 09/19/2017   HCT 40.7 09/19/2017   MCV 91.4 09/19/2017   PLT 338 09/19/2017    Lab Results  Component Value Date   CREATININE 0.97 09/19/2017    Lab Results  Component Value Date   HGBA1C 5.2 02/12/2017    Lab Results  Component Value Date   TSH 0.034 (L) 02/12/2017       Component Value Date/Time   CHOL 234 (H) 02/12/2017 0735   HDL 43 02/12/2017 0735   CHOLHDL 5.4 02/12/2017 0735   VLDL 19 02/12/2017 0735   LDLCALC 172 (H) 02/12/2017 0735    Lab Results  Component Value Date   AST 18 09/19/2017   Lab Results  Component Value Date   ALT 15  09/19/2017    Urinalysis Component     Latest Ref Rng & Units 01/02/2020  Specific Gravity, UA     1.005 - 1.030 >1.030 (H)  pH, UA     5.0 - 7.5 5.5  Color, UA     Yellow Yellow  Appearance Ur     Clear Cloudy (A)  Leukocytes,UA     Negative Trace (A)  Protein,UA     Negative/Trace Negative  Glucose, UA  Negative Negative  Ketones, UA     Negative Negative  RBC, UA     Negative Trace (A)  Bilirubin, UA     Negative Negative  Urobilinogen, Ur     0.2 - 1.0 mg/dL 0.2  Nitrite, UA     Negative Negative  Microscopic Examination      See below:   Component     Latest Ref Rng & Units 01/02/2020  WBC, UA     0 - 5 /hpf 11-30 (A)  RBC     0 - 2 /hpf 0-2  Epithelial Cells (non renal)     0 - 10 /hpf 0-10  Bacteria, UA     None seen/Few Few    I have reviewed the labs.   Pertinent Imaging: Results for ROJEAN, IGE (MRN [id number]) as of 01/02/2020 11:51  Ref. Range 01/02/2020 11:45  Scan Result Unknown 19    Assessment & Plan:    1. Mixed Incontinence - Urinalysis, Complete - Bladder Scan (Post Void Residual) in office - At goal with Myrbetriq 50 mg daily - refill - RTC in one year for OAB questionnaire and PVR  2. History of hematuria Hematuria work up completed in 2017 - findings positive for NED No report of gross hematuria  UA today negative RTC in one year for UA - patient to report any gross hematuria in the interim    Return in about 1 year (around 01/01/2021) for PVR, UA and OAB questionnaire.  These notes generated with voice recognition software. I apologize for typographical errors.  Michiel Cowboy, PA-C  [facility] [address]  [address] [place], [place] [address] [phone]

## 2020-01-03 LAB — URINALYSIS, COMPLETE
Bilirubin, UA: NEGATIVE
Glucose, UA: NEGATIVE
Ketones, UA: NEGATIVE
Nitrite, UA: NEGATIVE
Protein,UA: NEGATIVE
Specific Gravity, UA: >1.03 — ABNORMAL HIGH (ref 1.005–1.030)
Urobilinogen, Ur: 0.2 mg/dL (ref 0.2–1.0)
pH, UA: 5.5 (ref 5.0–7.5)

## 2020-01-03 LAB — MICROSCOPIC EXAMINATION

## 2020-04-01 ENCOUNTER — Telehealth: Payer: Self-pay

## 2020-04-01 NOTE — Telephone Encounter (Signed)
Spoke with pharmacy- PA sent.

## 2020-04-01 NOTE — Telephone Encounter (Signed)
Patient called stating she needs another PA for the Myrbetriq 50mg . Hers expired 03/28/20.

## 2020-04-18 ENCOUNTER — Telehealth: Payer: Self-pay | Admitting: Family Medicine

## 2020-04-18 NOTE — Telephone Encounter (Signed)
Patient notified Myrbetriq has been approved Approval date 04/18/2020-04/13/2021 ID # [id number]

## 2020-05-20 DIAGNOSIS — G5603 Carpal tunnel syndrome, bilateral upper limbs: Secondary | ICD-10-CM | POA: Insufficient documentation

## 2020-07-16 ENCOUNTER — Other Ambulatory Visit: Payer: Self-pay | Admitting: Orthopedic Surgery

## 2020-07-25 ENCOUNTER — Encounter (HOSPITAL_BASED_OUTPATIENT_CLINIC_OR_DEPARTMENT_OTHER): Payer: Self-pay | Admitting: Orthopedic Surgery

## 2020-07-25 ENCOUNTER — Other Ambulatory Visit: Payer: Self-pay

## 2020-07-29 ENCOUNTER — Inpatient Hospital Stay (HOSPITAL_COMMUNITY): Admission: RE | Admit: 2020-07-29 | Payer: Medicaid Other | Source: Ambulatory Visit

## 2020-08-01 ENCOUNTER — Ambulatory Visit (HOSPITAL_BASED_OUTPATIENT_CLINIC_OR_DEPARTMENT_OTHER): Admit: 2020-08-01 | Payer: Medicaid Other | Admitting: Orthopedic Surgery

## 2020-08-01 SURGERY — CARPAL TUNNEL RELEASE
Anesthesia: Choice | Laterality: Right

## 2020-11-05 ENCOUNTER — Emergency Department: Payer: Medicaid Other

## 2020-11-05 ENCOUNTER — Encounter: Payer: Self-pay | Admitting: Emergency Medicine

## 2020-11-05 ENCOUNTER — Other Ambulatory Visit: Payer: Self-pay

## 2020-11-05 ENCOUNTER — Emergency Department
Admission: EM | Admit: 2020-11-05 | Discharge: 2020-11-05 | Disposition: A | Discharge status: Home / Self Care | Payer: Medicaid Other | Attending: Emergency Medicine | Admitting: Emergency Medicine

## 2020-11-05 DIAGNOSIS — F17210 Nicotine dependence, cigarettes, uncomplicated: Secondary | ICD-10-CM | POA: Insufficient documentation

## 2020-11-05 DIAGNOSIS — Z79899 Other long term (current) drug therapy: Secondary | ICD-10-CM | POA: Diagnosis not present

## 2020-11-05 DIAGNOSIS — J45909 Unspecified asthma, uncomplicated: Secondary | ICD-10-CM | POA: Diagnosis not present

## 2020-11-05 DIAGNOSIS — J069 Acute upper respiratory infection, unspecified: Secondary | ICD-10-CM | POA: Diagnosis not present

## 2020-11-05 DIAGNOSIS — M546 Pain in thoracic spine: Secondary | ICD-10-CM | POA: Diagnosis not present

## 2020-11-05 DIAGNOSIS — R0981 Nasal congestion: Secondary | ICD-10-CM | POA: Diagnosis present

## 2020-11-05 DIAGNOSIS — E039 Hypothyroidism, unspecified: Secondary | ICD-10-CM | POA: Insufficient documentation

## 2020-11-05 MED ORDER — FLUTICASONE PROPIONATE 50 MCG/ACT NA SUSP: 2.0000 | Freq: Every day | NASAL | 0 refills | Status: AC

## 2020-11-05 NOTE — ED Notes (Signed)
Patient left without discharge instructions. Patient was called and informed of prescription. Patient states she does not need the paper work. Patient denies any pain.

## 2020-11-05 NOTE — ED Triage Notes (Signed)
Pt comes into the ED via POV c/o nasal congestion and back pain on the upper right side. Pt denies any cough or sore throat.  Pt in NAD at this time with even and unlabored respirations.  Pt denies any injury to the back.

## 2020-11-05 NOTE — ED Provider Notes (Signed)
[facility] Emergency Department Provider Note  ____________________________________________   Event Date/Time   First MD Initiated Contact with Patient 11/05/20 1830     (approximate)  I have reviewed the triage vital signs and the nursing notes.   HISTORY  Chief Complaint Nasal Congestion and Back Pain  HPI [name] is a 45 y.o. female who reports to the emergency department for evaluation of nasal congestion and sinus pressure that has been present for the last 5 to 6 days.  She denies any cough, shortness of breath, chest pain, fever.  She denies any known COVID exposure, has not been vaccinated against COVID-19.  She states that she is limited in the over-the-counter decongestants that she is able to take secondary to other medications that she is on and did not know what to do.  She states that she cannot get into see her primary care doctor right away unless presenting to the emergency room.  She also complains of an intermittent pain in the right thoracic back region.  She denies any trauma with this and has history of chronic back pain.  She denies any weakness with this.         Past Medical History:  Diagnosis Date  . Asthma   . Bleeding hemorrhoid   . GERD (gastroesophageal reflux disease)   . Hypothyroidism   . Seizures (HCC)    last seizure 2018 'stress related'    Patient Active Problem List   Diagnosis Date Noted  . Hypothyroidism 02/14/2017  . GERD (gastroesophageal reflux disease) 02/14/2017  . Tobacco use disorder 02/14/2017  . Adjustment disorder with mixed disturbance of emotions and conduct 02/11/2017  . Severe recurrent major depression without psychotic features (HCC) 02/11/2017  . Microscopic hematuria 11/19/2016  . Pelvic pain in female 11/19/2016  . Stable angina pectoris (HCC) 08/24/2016  . Patellofemoral stress syndrome of right knee 11/24/2015  . Sacroiliac joint dysfunction 06/03/2015  . DJD (degenerative  joint disease) of knee 03/28/2015  . DDD (degenerative disc disease), lumbar 03/06/2015  . Lumbar post-laminectomy syndrome 03/06/2015  . DDD (degenerative disc disease), cervical 03/06/2015  . 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 Bilateral occipital neuralgia 03/06/2015  . Adnexal pain 07/13/2013  . Female genuine stress incontinence 07/13/2013  . Urge incontinence 07/13/2013  . FOM (frequency of micturition) 07/13/2013    Past Surgical History:  Procedure Laterality Date  . CESAREAN SECTION  2003   with tubal ligation   . CHOLESTEATOMA EXCISION  2010   to remove a benign tumor behind R ear drum   . removal of bilateral fallopian tubes Bilateral 2012  . removal of L ovary Left 2012   due a cyst and scar tissue  . SPINE SURGERY  2012   2 surgeries: to remove a herniated disc and to remove a pus pocket at incision site   . TUBAL LIGATION    . VAGINAL HYSTERECTOMY  2007     due to fibroid cysts    Prior to Admission medications   Medication Sig Start Date End Date Taking? Authorizing Provider  fluticasone (FLONASE) 50 MCG/ACT nasal spray Place 2 sprays into both nostrils daily. 11/05/20 12/05/20 Yes Landry Lookingbill, Ruben Gottronaitlin J, PA  amphetamine-dextroamphetamine (ADDERALL) 30 MG tablet Take 30 mg by mouth 3 (three) times daily.     [provider]  Ascorbic Acid (VITAMIN C) 1000 MG tablet Take 1,000 mg by mouth daily.    [provider]  conjugated estrogens (PREMARIN) vaginal  cream Premarin 0.625 mg/gram vaginal cream  INSERT 1 GRAM VAGINALLY NIGHTLY AT BEDTIME 3 TIMES PER WEEK 01/02/20   McGowan, Carollee Herter A, PA-C  Cyanocobalamin 1000 MCG SUBL cyanocobalamin (vit B-12) 1,000 mcg sublingual tablet    [provider]  diclofenac (CATAFLAM) 50 MG tablet 1 tab by mouth per day or twice a day if tolerated Patient taking differently: Take 50 mg by mouth 2 (two) times daily.  06/24/16   Ewing Schlein, MD  diclofenac sodium (VOLTAREN) 1 % GEL  Apply 2-4 g to painful area of skin 4 times per day if tolerated. Apply only a maximum of 4 g per application 04/20/16   Ewing Schlein, MD  doxepin [facility]) 25 MG capsule  11/24/18   [provider]  levocetirizine (XYZAL) 5 MG tablet Take 5 mg by mouth every evening.    [provider]  levothyroxine (SYNTHROID) 150 MCG tablet Take 150 mcg by mouth daily before breakfast.    [provider]  lidocaine (XYLOCAINE) 5 % ointment lidocaine 5 % topical ointment  Apply 1-2 grams TO affected AREA 3 TO 4 TIMES daily AS directed.    [provider]  mirabegron ER (MYRBETRIQ) 50 MG TB24 tablet Take 1 tablet (50 mg total) by mouth daily. 01/02/20   McGowan, Carollee Herter A, PA-C  omeprazole (PRILOSEC) 40 MG capsule omeprazole 40 mg capsule,delayed release  TAKE ONE CAPSULE BY MOUTH EVERY DAY FOR REFLUX    [provider]  oxyCODONE-acetaminophen (PERCOCET) 10-325 MG tablet Percocet 10 mg-325 mg tablet  Take 1 tablet every 6 hours by oral route.    [provider]    Allergies Celecoxib, Darvon [propoxyphene], and Gabapentin  Family History  Problem Relation Age of Onset  . Alcohol abuse Mother   . Arthritis Mother   . Depression Mother   . Hearing loss Mother   . Alcohol abuse Father   . Arthritis Father   . COPD Father   . Depression Father   . Hearing loss Father   . Hyperlipidemia Father   . Hypertension Father   . Breast cancer Paternal Grandmother     Social History Social History   Tobacco Use  . Smoking status: Current Every Day Smoker    Packs/day: 0.50    Types: Cigarettes  . Smokeless tobacco: Never Used  Substance Use Topics  . Alcohol use: No    Alcohol/week: 0.0 standard drinks    Comment: occasionally  . Drug use: No    Review of Systems Constitutional: No fever/chills Eyes: No visual changes. ENT: + Nasal congestion, no sore throat. Cardiovascular: Denies chest pain. Respiratory: Denies shortness of  breath. Gastrointestinal: No abdominal pain.  No nausea, no vomiting.  No diarrhea.  No constipation. Genitourinary: Negative for dysuria. Musculoskeletal: Negative for back pain. Skin: Negative for rash. Neurological: Negative for headaches, focal weakness or numbness.   ____________________________________________   PHYSICAL EXAM:  VITAL SIGNS: ED Triage Vitals  Enc Vitals Group     BP 11/05/20 1806 128/86     Pulse Rate 11/05/20 1806 87     Resp 11/05/20 1806 18     Temp 11/05/20 1806 97.8 F (36.6 C)     Temp Source 11/05/20 1806 Oral     SpO2 11/05/20 1806 99 %     Weight 11/05/20 1807 180 lb (81.6 kg)     Height 11/05/20 1807 5\' 1"  (1.549 m)     Head Circumference --      Peak Flow --  Pain Score 11/05/20 1806 8     Pain Loc --      Pain Edu? --      Excl. in GC? --    Constitutional: Alert and oriented. Well appearing and in no acute distress. Eyes: Conjunctivae are normal. PERRL. EOMI. Head: Atraumatic. Nose: Turbinates are erythematous and enlarged, no active rhinorrhea.  No tenderness to the nasal bridge, maxillary sinuses or frontal sinuses. Mouth/Throat: Mucous membranes are moist.  Oropharynx non-erythematous.  No tonsillar enlargement or exudate. Neck: No stridor.  No tenderness to the anterior neck. Lymphatic: There is bilateral anterior cervical lymphadenopathy Cardiovascular: Normal rate, regular rhythm. Grossly normal heart sounds.  Good peripheral circulation. Respiratory: Normal respiratory effort.  No retractions. Lungs CTAB. Musculoskeletal: There is mild tenderness to palpation of the right periscapular region.  This pain is not exacerbated with shoulder range of motion. Neurologic:  Normal speech and language. No gross focal neurologic deficits are appreciated. No gait instability. Skin:  Skin is warm, dry and intact. No rash noted. Psychiatric: Mood and affect are normal. Speech and behavior are  normal.  ____________________________________________  RADIOLOGY I, Lucy Chris, personally viewed and evaluated these images (plain radiographs) as part of my medical decision making, as well as reviewing the written report by the radiologist.  ED provider interpretation: No acute pneumonia  Official radiology report(s): DG Chest Portable 1 View  Result Date: 11/05/2020 CLINICAL DATA:  Right-sided pain and cough EXAM: PORTABLE CHEST 1 VIEW COMPARISON:  Chest radiographs December 18, 2012 FINDINGS: The heart size and mediastinal contours are within normal limits. No focal consolidation. No pleural effusion or pneumothorax. The visualized skeletal structures are unremarkable. IMPRESSION: No acute cardiopulmonary disease. Electronically Signed   By: Maudry Mayhew MD   On: 11/05/2020 19:15   ____________________________________________   INITIAL IMPRESSION / ASSESSMENT AND PLAN / ED COURSE  As part of my medical decision making, I reviewed the following data within the electronic MEDICAL RECORD NUMBER Nursing notes reviewed and incorporated, Radiograph reviewed  and Notes from prior ED visits        Patient is a 45 year old female who presents to the emergency department for evaluation of sinus pressure and nasal congestion without active cough, fever or other respiratory symptoms.  Patient also has intermittent right sided periscapular pain.  See HPI for further details.  In triage, the patient is afebrile and has normal vital signs.  Physical exam does demonstrate some boggy turbinates with some cervical lymphadenopathy.  The symptoms are most consistent with viral URI/viral sinusitis.  The patient declines COVID test, stating that she is concerned over their accuracy.  Patient does not had a cough.  Discussed symptomatic treatment with the patient.  She is concerned about decongestant use due to the diclofenac that she is on and has been told not to use them together.  Recommended Flonase  for her nasal congestion.  In regards to her back pain, it is intermittent, reproducible in the muscular periscapular region without any trauma.  She also has known history of degenerative disc disease and back issues on chronic opioid therapy.  Recommended supportive treatment for this including ice or heat.  Patient is amenable with this plan.  She is stable this time for outpatient therapy.      ____________________________________________   FINAL CLINICAL IMPRESSION(S) / ED DIAGNOSES  Final diagnoses:  Viral URI     ED Discharge Orders         Ordered    fluticasone (FLONASE) 50 MCG/ACT nasal spray  Daily        11/05/20 1925          *Please note:  MINDEL FRISCIA was evaluated in Emergency Department on 11/05/2020 for the symptoms described in the history of present illness. She was evaluated in the context of the global COVID-19 pandemic, which necessitated consideration that the patient might be at risk for infection with the SARS-CoV-2 virus that causes COVID-19. Institutional protocols and algorithms that pertain to the evaluation of patients at risk for COVID-19 are in a state of rapid change based on information released by regulatory bodies including the CDC and federal and state organizations. These policies and algorithms were followed during the patient's care in the ED.  Some ED evaluations and interventions may be delayed as a result of limited staffing during and the pandemic.*   Note:  This document was prepared using Dragon voice recognition software and may include unintentional dictation errors.   Lucy Chris, PA 11/05/20 2256    Delton Prairie, MD 11/05/20 [phone]

## 2021-01-07 ENCOUNTER — Ambulatory Visit: Payer: Self-pay | Admitting: Urology

## 2021-01-12 NOTE — Progress Notes (Deleted)
01/13/2021 10:07 AM   Gala Murdoch 1976/02/02 [id number]  Referring provider: Hillery Aldo, MD [address] [place],  [place] [address]  No chief complaint on file.  Urological history: 1.  Intermediate risk hematuria -smoker -CTU and cysto 2017 - NED -UA ***  2. Mixed incontinence - contributing factors of age, pelvic surgery, vaginal atrophy, depression, narcotic pain medication and COPD -Managed with Myrbetriq 50 mg daily  3. Chronic pelvic pain -Long h/o chronic pain (chest pain, back pain, pelvic pain) of non-organic etiology  4.  Pyelonephritis -Incident 2016  5. Vaginal atrophy -Managed with vaginal estrogen cream 3 nights weekly ***    HPI: [name] is a 45 y.o. female who presents today for a yearly follow up.    The patient is  experiencing urgency x 4-7 (worse), frequency x 8 or more (stable), is restricting fluids to avoid visits to the restroom, is engaging in toilet mapping, incontinence x 0-3 (stable) and nocturia x 0-3 (stable).   Her BP is 128/74.   Her PVR is 19 mL.  At goal with Myrbetriq 50 mg daily.       PMH: Past Medical History:  Diagnosis Date  . Asthma   . Bleeding hemorrhoid   . GERD (gastroesophageal reflux disease)   . Hypothyroidism   . Seizures (HCC)    last seizure 2018 'stress related'    Surgical History: Past Surgical History:  Procedure Laterality Date  . CESAREAN SECTION  2003   with tubal ligation   . CHOLESTEATOMA EXCISION  2010   to remove a benign tumor behind R ear drum   . removal of bilateral fallopian tubes Bilateral 2012  . removal of L ovary Left 2012   due a cyst and scar tissue  . SPINE SURGERY  2012   2 surgeries: to remove a herniated disc and to remove a pus pocket at incision site   . TUBAL LIGATION    . VAGINAL HYSTERECTOMY  2007     due to fibroid cysts    Home Medications:  Allergies as of 01/13/2021      Reactions   Celecoxib Other (See Comments)   Darvon  [propoxyphene] Hypertension   Other reaction(s): Unknown Heart race   Gabapentin Rash      Medication List       Accurate as of January 12, 2021 10:07 AM. If you have any questions, ask your nurse or doctor.        amphetamine-dextroamphetamine 30 MG tablet Commonly known as: ADDERALL Take 30 mg by mouth 3 (three) times daily.   Cyanocobalamin 1000 MCG Subl cyanocobalamin (vit B-12) 1,000 mcg sublingual tablet   diclofenac 50 MG tablet Commonly known as: CATAFLAM 1 tab by mouth per day or twice a day if tolerated What changed:   how much to take  how to take this  when to take this  additional instructions   diclofenac sodium 1 % Gel Commonly known as: Voltaren Apply 2-4 g to painful area of skin 4 times per day if tolerated. Apply only a maximum of 4 g per application   doxepin 25 MG capsule Commonly known as: SINEQUAN   fluticasone 50 MCG/ACT nasal spray Commonly known as: Flonase Place 2 sprays into both nostrils daily.   levocetirizine 5 MG tablet Commonly known as: XYZAL Take 5 mg by mouth every evening.   levothyroxine 150 MCG tablet Commonly known as: SYNTHROID Take 150 mcg by mouth daily before breakfast.  lidocaine 5 % ointment Commonly known as: XYLOCAINE lidocaine 5 % topical ointment  Apply 1-2 grams TO affected AREA 3 TO 4 TIMES daily AS directed.   mirabegron ER 50 MG Tb24 tablet Commonly known as: Myrbetriq Take 1 tablet (50 mg total) by mouth daily.   omeprazole 40 MG capsule Commonly known as: PRILOSEC omeprazole 40 mg capsule,delayed release  TAKE ONE CAPSULE BY MOUTH EVERY DAY FOR REFLUX   Percocet 10-325 MG tablet Generic drug: oxyCODONE-acetaminophen Percocet 10 mg-325 mg tablet  Take 1 tablet every 6 hours by oral route.   Premarin vaginal cream Generic drug: conjugated estrogens Premarin 0.625 mg/gram vaginal cream  INSERT 1 GRAM VAGINALLY NIGHTLY AT BEDTIME 3 TIMES PER WEEK   vitamin C 1000 MG tablet Take 1,000 mg by  mouth daily.       Allergies:  Allergies  Allergen Reactions  . Celecoxib Other (See Comments)  . Darvon [Propoxyphene] Hypertension    Other reaction(s): Unknown Heart race  . Gabapentin Rash    Family History: Family History  Problem Relation Age of Onset  . Alcohol abuse Mother   . Arthritis Mother   . Depression Mother   . Hearing loss Mother   . Alcohol abuse Father   . Arthritis Father   . COPD Father   . Depression Father   . Hearing loss Father   . Hyperlipidemia Father   . Hypertension Father   . Breast cancer Paternal Grandmother     Social History:  reports that she has been smoking cigarettes. She has been smoking about 0.50 packs per day. She has never used smokeless tobacco. She reports that she does not drink alcohol and does not use drugs.  ROS: Pertinent ROS in HPI  Physical Exam: There were no vitals taken for this visit.  Constitutional:  Well nourished. Alert and oriented, No acute distress. HEENT: [place] AT, moist mucus membranes.  Trachea midline Cardiovascular: No clubbing, cyanosis, or edema. Respiratory: Normal respiratory effort, no increased work of breathing. GI: Abdomen is soft, non tender, non distended, no abdominal masses. Liver and spleen not palpable.  No hernias appreciated.  Stool sample for occult testing is not indicated.   GU: No CVA tenderness.  No bladder fullness or masses.  Patient with circumcised/uncircumcised phallus. ***Foreskin easily retracted***  Urethral meatus is patent.  No penile discharge. No penile lesions or rashes. Scrotum without lesions, cysts, rashes and/or edema.  Testicles are located scrotally bilaterally. No masses are appreciated in the testicles. Left and right epididymis are normal. Rectal: Patient with  normal sphincter tone. Anus and perineum without scarring or rashes. No rectal masses are appreciated. Prostate is approximately *** grams, *** nodules are appreciated. Seminal vesicles are normal. Skin: No  rashes, bruises or suspicious lesions. Lymph: No inguinal adenopathy. Neurologic: Grossly intact, no focal deficits, moving all 4 extremities. Psychiatric: Normal mood and affect.  Laboratory Data: Urinalysis *** I have reviewed the labs.   Pertinent Imaging: ***   Assessment & Plan:    1. Mixed Incontinence - Urinalysis, Complete - Bladder Scan (Post Void Residual) in office - At goal with Myrbetriq 50 mg daily - refill - RTC in one year for OAB questionnaire and PVR  2. History of hematuria Hematuria work up completed in 2017 - findings positive for NED No report of gross hematuria  UA today negative RTC in one year for UA - patient to report any gross hematuria in the interim    3. Vaginal atrophy -Continue vaginal estrogen cream  3 nights weekly  No follow-ups on file.  These notes generated with voice recognition software. I apologize for typographical errors.  Michiel Cowboy, PA-C  [facility] [address]  [address] [place], [place] [address] [phone]

## 2021-01-13 ENCOUNTER — Other Ambulatory Visit: Payer: Self-pay | Admitting: Urology

## 2021-01-13 ENCOUNTER — Ambulatory Visit: Payer: Self-pay | Admitting: Urology

## 2021-01-13 DIAGNOSIS — R319 Hematuria, unspecified: Secondary | ICD-10-CM

## 2021-01-13 DIAGNOSIS — N3946 Mixed incontinence: Secondary | ICD-10-CM

## 2021-01-13 DIAGNOSIS — N952 Postmenopausal atrophic vaginitis: Secondary | ICD-10-CM

## 2021-01-13 DIAGNOSIS — N3281 Overactive bladder: Secondary | ICD-10-CM

## 2021-01-13 DIAGNOSIS — N3941 Urge incontinence: Secondary | ICD-10-CM

## 2021-01-14 NOTE — Progress Notes (Deleted)
01/15/2021 9:03 AM   [name] November 10, 1975 [id number]  Referring provider: Hillery Aldo, MD [address] [place],  [place] [address]  No chief complaint on file.  Urological history: 1.  Intermediate risk hematuria -smoker -CTU and cysto 2017 - NED -UA ***  2. Mixed incontinence - contributing factors of age, pelvic surgery, vaginal atrophy, depression, narcotic pain medication and COPD -Managed with Myrbetriq 50 mg daily  3. Chronic pelvic pain -Long h/o chronic pain (chest pain, back pain, pelvic pain) of non-organic etiology  4.  Pyelonephritis -Incident 2016  5. Vaginal atrophy -Managed with vaginal estrogen cream 3 nights weekly ***    HPI: [name] is a 45 y.o. female who presents today for a yearly follow up.    The patient is  experiencing urgency x 4-7 (worse), frequency x 8 or more (stable), is restricting fluids to avoid visits to the restroom, is engaging in toilet mapping, incontinence x 0-3 (stable) and nocturia x 0-3 (stable).   Her BP is 128/74.   Her PVR is 19 mL.  At goal with Myrbetriq 50 mg daily.       PMH: Past Medical History:  Diagnosis Date  . Asthma   . Bleeding hemorrhoid   . GERD (gastroesophageal reflux disease)   . Hypothyroidism   . Seizures (HCC)    last seizure 2018 'stress related'    Surgical History: Past Surgical History:  Procedure Laterality Date  . CESAREAN SECTION  2003   with tubal ligation   . CHOLESTEATOMA EXCISION  2010   to remove a benign tumor behind R ear drum   . removal of bilateral fallopian tubes Bilateral 2012  . removal of L ovary Left 2012   due a cyst and scar tissue  . SPINE SURGERY  2012   2 surgeries: to remove a herniated disc and to remove a pus pocket at incision site   . TUBAL LIGATION    . VAGINAL HYSTERECTOMY  2007     due to fibroid cysts    Home Medications:  Allergies as of 01/15/2021      Reactions   Celecoxib Other (See Comments)   Darvon  [propoxyphene] Hypertension   Other reaction(s): Unknown Heart race   Gabapentin Rash      Medication List       Accurate as of January 14, 2021  9:03 AM. If you have any questions, ask your nurse or doctor.        amphetamine-dextroamphetamine 30 MG tablet Commonly known as: ADDERALL Take 30 mg by mouth 3 (three) times daily.   Cyanocobalamin 1000 MCG Subl cyanocobalamin (vit B-12) 1,000 mcg sublingual tablet   diclofenac 50 MG tablet Commonly known as: CATAFLAM 1 tab by mouth per day or twice a day if tolerated What changed:   how much to take  how to take this  when to take this  additional instructions   diclofenac sodium 1 % Gel Commonly known as: Voltaren Apply 2-4 g to painful area of skin 4 times per day if tolerated. Apply only a maximum of 4 g per application   doxepin 25 MG capsule Commonly known as: SINEQUAN   fluticasone 50 MCG/ACT nasal spray Commonly known as: Flonase Place 2 sprays into both nostrils daily.   levocetirizine 5 MG tablet Commonly known as: XYZAL Take 5 mg by mouth every evening.   levothyroxine 150 MCG tablet Commonly known as: SYNTHROID Take 150 mcg by mouth daily before breakfast.  lidocaine 5 % ointment Commonly known as: XYLOCAINE lidocaine 5 % topical ointment  Apply 1-2 grams TO affected AREA 3 TO 4 TIMES daily AS directed.   Myrbetriq 50 MG Tb24 tablet Generic drug: mirabegron ER TAKE 1 TABLET BY MOUTH DAILY   omeprazole 40 MG capsule Commonly known as: PRILOSEC omeprazole 40 mg capsule,delayed release  TAKE ONE CAPSULE BY MOUTH EVERY DAY FOR REFLUX   Percocet 10-325 MG tablet Generic drug: oxyCODONE-acetaminophen Percocet 10 mg-325 mg tablet  Take 1 tablet every 6 hours by oral route.   Premarin vaginal cream Generic drug: conjugated estrogens Premarin 0.625 mg/gram vaginal cream  INSERT 1 GRAM VAGINALLY NIGHTLY AT BEDTIME 3 TIMES PER WEEK   vitamin C 1000 MG tablet Take 1,000 mg by mouth daily.        Allergies:  Allergies  Allergen Reactions  . Celecoxib Other (See Comments)  . Darvon [Propoxyphene] Hypertension    Other reaction(s): Unknown Heart race  . Gabapentin Rash    Family History: Family History  Problem Relation Age of Onset  . Alcohol abuse Mother   . Arthritis Mother   . Depression Mother   . Hearing loss Mother   . Alcohol abuse Father   . Arthritis Father   . COPD Father   . Depression Father   . Hearing loss Father   . Hyperlipidemia Father   . Hypertension Father   . Breast cancer Paternal Grandmother     Social History:  reports that she has been smoking cigarettes. She has been smoking about 0.50 packs per day. She has never used smokeless tobacco. She reports that she does not drink alcohol and does not use drugs.  ROS: Pertinent ROS in HPI  Physical Exam: There were no vitals taken for this visit.  Constitutional:  Well nourished. Alert and oriented, No acute distress. HEENT: [place] AT, moist mucus membranes.  Trachea midline Cardiovascular: No clubbing, cyanosis, or edema. Respiratory: Normal respiratory effort, no increased work of breathing. GI: Abdomen is soft, non tender, non distended, no abdominal masses. Liver and spleen not palpable.  No hernias appreciated.  Stool sample for occult testing is not indicated.   GU: No CVA tenderness.  No bladder fullness or masses.  Patient with circumcised/uncircumcised phallus. ***Foreskin easily retracted***  Urethral meatus is patent.  No penile discharge. No penile lesions or rashes. Scrotum without lesions, cysts, rashes and/or edema.  Testicles are located scrotally bilaterally. No masses are appreciated in the testicles. Left and right epididymis are normal. Rectal: Patient with  normal sphincter tone. Anus and perineum without scarring or rashes. No rectal masses are appreciated. Prostate is approximately *** grams, *** nodules are appreciated. Seminal vesicles are normal. Skin: No rashes, bruises or  suspicious lesions. Lymph: No inguinal adenopathy. Neurologic: Grossly intact, no focal deficits, moving all 4 extremities. Psychiatric: Normal mood and affect.  Laboratory Data: Urinalysis *** I have reviewed the labs.   Pertinent Imaging: ***   Assessment & Plan:    1. Mixed Incontinence - Urinalysis, Complete - Bladder Scan (Post Void Residual) in office - At goal with Myrbetriq 50 mg daily - refill - RTC in one year for OAB questionnaire and PVR  2. History of hematuria Hematuria work up completed in 2017 - findings positive for NED No report of gross hematuria  UA today negative RTC in one year for UA - patient to report any gross hematuria in the interim    3. Vaginal atrophy -Continue vaginal estrogen cream 3 nights weekly  No follow-ups on file.  These notes generated with voice recognition software. I apologize for typographical errors.  Zara Council, PA-C  [facility] [address]  [place], [place] [address] [phone]

## 2021-01-15 ENCOUNTER — Ambulatory Visit: Payer: Medicaid Other | Admitting: Urology

## 2021-01-15 ENCOUNTER — Other Ambulatory Visit: Payer: Self-pay

## 2021-01-15 DIAGNOSIS — N3281 Overactive bladder: Secondary | ICD-10-CM

## 2021-01-15 DIAGNOSIS — R319 Hematuria, unspecified: Secondary | ICD-10-CM

## 2021-01-15 DIAGNOSIS — N952 Postmenopausal atrophic vaginitis: Secondary | ICD-10-CM

## 2021-02-02 ENCOUNTER — Telehealth: Payer: Self-pay | Admitting: Urology

## 2021-02-02 NOTE — Telephone Encounter (Signed)
Patient's appointment was cancelled as her insurance will not cover her services at our office.

## 2022-06-02 ENCOUNTER — Ambulatory Visit (INDEPENDENT_AMBULATORY_CARE_PROVIDER_SITE_OTHER): Payer: Medicaid Other

## 2022-06-02 ENCOUNTER — Ambulatory Visit: Payer: Medicaid Other | Admitting: Podiatry

## 2022-06-02 ENCOUNTER — Encounter: Payer: Self-pay | Admitting: Podiatry

## 2022-06-02 DIAGNOSIS — M2042 Other hammer toe(s) (acquired), left foot: Secondary | ICD-10-CM

## 2022-06-02 DIAGNOSIS — T148XXA Other injury of unspecified body region, initial encounter: Secondary | ICD-10-CM

## 2022-06-02 NOTE — Progress Notes (Signed)
Subjective:  Patient ID: [name], female    DOB: 03/19/1976,  MRN: [id number] HPI Chief Complaint  Patient presents with   Toe Pain    2nd toe left - HT deformity x years, last 2-3 months noticed more achy, rubbing in shoes, pressure on tip of toe  Also having cramping in the toes on the right foot   New Patient (Initial Visit)    Est pt 09/2019    46 y.o. female presents with the above complaint.   ROS: Denies fever chills nausea vomiting muscle aches pains calf pain back pain chest pain shortness of breath.  Past Medical History:  Diagnosis Date   Asthma    Bleeding hemorrhoid    GERD (gastroesophageal reflux disease)    Hypothyroidism    Seizures (HCC)    last seizure 2018 'stress related'   Past Surgical History:  Procedure Laterality Date   CESAREAN SECTION  2003   with tubal ligation    CHOLESTEATOMA EXCISION  2010   to remove a benign tumor behind R ear drum    removal of bilateral fallopian tubes Bilateral 2012   removal of L ovary Left 2012   due a cyst and scar tissue   SPINE SURGERY  2012   2 surgeries: to remove a herniated disc and to remove a pus pocket at incision site    TUBAL LIGATION     VAGINAL HYSTERECTOMY  2007     due to fibroid cysts    Current Outpatient Medications:    amphetamine-dextroamphetamine (ADDERALL) 30 MG tablet, Take 30 mg by mouth 3 (three) times daily. , Disp: , Rfl:    Ascorbic Acid (VITAMIN C) 1000 MG tablet, Take 1,000 mg by mouth daily., Disp: , Rfl:    atorvastatin (LIPITOR) 40 MG tablet, Take 40 mg by mouth daily., Disp: , Rfl:    baclofen (LIORESAL) 10 MG tablet, Take 10 mg by mouth 2 (two) times daily., Disp: , Rfl:    buPROPion (WELLBUTRIN) 75 MG tablet, Take 75 mg by mouth 2 (two) times daily., Disp: , Rfl:    conjugated estrogens (PREMARIN) vaginal cream, Premarin 0.625 mg/gram vaginal cream  INSERT 1 GRAM VAGINALLY NIGHTLY AT BEDTIME 3 TIMES PER WEEK, Disp: 42.5 g, Rfl: 3   Cyanocobalamin 1000 MCG SUBL,  cyanocobalamin (vit B-12) 1,000 mcg sublingual tablet, Disp: , Rfl:    diclofenac sodium (VOLTAREN) 1 % GEL, Apply 2-4 g to painful area of skin 4 times per day if tolerated. Apply only a maximum of 4 g per application, Disp: 500 g, Rfl: 0   doxepin (SINEQUAN) 25 MG capsule, , Disp: , Rfl:    fluticasone (FLONASE) 50 MCG/ACT nasal spray, Place 2 sprays into both nostrils daily., Disp: 9.9 mL, Rfl: 0   levocetirizine (XYZAL) 5 MG tablet, Take 5 mg by mouth every evening., Disp: , Rfl:    levothyroxine (SYNTHROID) 150 MCG tablet, Take 150 mcg by mouth daily before breakfast., Disp: , Rfl:    lidocaine (XYLOCAINE) 5 % ointment, lidocaine 5 % topical ointment  Apply 1-2 grams TO affected AREA 3 TO 4 TIMES daily AS directed., Disp: , Rfl:    MYRBETRIQ 50 MG TB24 tablet, TAKE 1 TABLET BY MOUTH DAILY, Disp: 90 tablet, Rfl: 3   omeprazole (PRILOSEC) 40 MG capsule, omeprazole 40 mg capsule,delayed release  TAKE ONE CAPSULE BY MOUTH EVERY DAY FOR REFLUX, Disp: , Rfl:    oxyCODONE-acetaminophen (PERCOCET) 10-325 MG tablet, Percocet 10 mg-325 mg tablet  Take 1 tablet  every 6 hours by oral route., Disp: , Rfl:   Allergies  Allergen Reactions   Celecoxib Other (See Comments)   Darvon [Propoxyphene] Hypertension    Other reaction(s): Unknown Heart race   Gabapentin Rash   Review of Systems Objective:  There were no vitals filed for this visit.  General: Well developed, nourished, in no acute distress, alert and oriented x3   Dermatological: Skin is warm, dry and supple bilateral. Nails x 10 are well maintained; remaining integument appears unremarkable at this time. There are no open sores, no preulcerative lesions, no rash or signs of infection present.  Vascular: Dorsalis Pedis artery and Posterior Tibial artery pedal pulses are 2/4 bilateral with immedate capillary fill time. Pedal hair growth present. No varicosities and no lower extremity edema present bilateral.   Neruologic: Grossly intact via  light touch bilateral. Vibratory intact via tuning fork bilateral. Protective threshold with Semmes Wienstein monofilament intact to all pedal sites bilateral. Patellar and Achilles deep tendon reflexes 2+ bilateral. No Babinski or clonus noted bilateral.   Musculoskeletal: No gross boney pedal deformities bilateral. No pain, crepitus, or limitation noted with foot and ankle range of motion bilateral. Muscular strength 5/5 in all groups tested bilateral.  Contracted hammertoe deformity.  The joint appears to be dislocatable on dorsiflexion plantarflexion most likely plantar plate tear she also has medial deviation of the toe at the metatarsal phalangeal joint left she also has osteoarthritic changes at the level of the PIPJ second digit.  Gait: Unassisted, Nonantalgic.    Radiographs:  Radiographs demonstrate an osseously mature individual with significantly shortened first metatarsal resulting in an elongated lesser metatarsals.  This is most likely resulting in a plantar plate tear and the inflammation surrounding the joint.  Hammertoe deformity is noted cocked up at the level of the metatarsophalangeal joint and PIPJ contracture most likely arthritic.  Assessment & Plan:   Assessment: Osteoarthritis PIPJ second digit left foot.  Hammertoe deformity with capsulitis probable plantar plate tear second digit left foot.  Plan: Requesting MRI for evaluation of the plantar plate and the integrity of the capsule.  This is for differential diagnosis and surgical planning.     Amoy Steeves T. [place], [place]

## 2022-06-16 ENCOUNTER — Other Ambulatory Visit: Payer: Medicaid Other

## 2022-06-25 ENCOUNTER — Other Ambulatory Visit: Payer: Medicaid Other

## 2022-09-03 ENCOUNTER — Other Ambulatory Visit: Payer: Self-pay | Admitting: Family Medicine

## 2022-09-03 DIAGNOSIS — Z1231 Encounter for screening mammogram for malignant neoplasm of breast: Secondary | ICD-10-CM

## 2022-11-10 ENCOUNTER — Telehealth: Payer: Self-pay

## 2022-11-10 ENCOUNTER — Other Ambulatory Visit: Payer: Self-pay

## 2022-11-10 DIAGNOSIS — Z1211 Encounter for screening for malignant neoplasm of colon: Secondary | ICD-10-CM

## 2022-11-10 DIAGNOSIS — R195 Other fecal abnormalities: Secondary | ICD-10-CM

## 2022-11-10 MED ORDER — NA SULFATE-K SULFATE-MG SULF 17.5-3.13-1.6 GM/177ML PO SOLN: 1.0000 | Freq: Once | ORAL | 0 refills | Status: AC

## 2022-11-10 NOTE — Telephone Encounter (Signed)
Gastroenterology Pre-Procedure Review  Request Date: 12/06/22 Requesting Physician: Dr. Marius Ditch  PATIENT REVIEW QUESTIONS: The patient responded to the following health history questions as indicated:    1. Are you having any GI issues? Patient denies any GI issues.  Referral stated positive occult. No  previous colonoscopy 2. Do you have a personal history of Polyps? no 3. Do you have a family history of Colon Cancer or Polyps? no 4. Diabetes Mellitus? no 5. Joint replacements in the past 12 months?no 6. Major health problems in the past 3 months?no 7. Any artificial heart valves, MVP, or defibrillator?no    MEDICATIONS & ALLERGIES:    Patient reports the following regarding taking any anticoagulation/antiplatelet therapy:   Plavix, Coumadin, Eliquis, Xarelto, Lovenox, Pradaxa, Brilinta, or Effient? no Aspirin? no  Patient confirms/reports the following medications:  Current Outpatient Medications  Medication Sig Dispense Refill   Na Sulfate-K Sulfate-Mg Sulf 17.5-3.13-1.6 GM/177ML SOLN Take 1 kit by mouth once for 1 dose. 354 mL 0   amphetamine-dextroamphetamine (ADDERALL) 30 MG tablet Take 30 mg by mouth 3 (three) times daily.      Ascorbic Acid (VITAMIN C) 1000 MG tablet Take 1,000 mg by mouth daily.     atorvastatin (LIPITOR) 40 MG tablet Take 40 mg by mouth daily.     baclofen (LIORESAL) 10 MG tablet Take 10 mg by mouth 2 (two) times daily.     buPROPion (WELLBUTRIN) 75 MG tablet Take 75 mg by mouth 2 (two) times daily.     conjugated estrogens (PREMARIN) vaginal cream Premarin 0.625 mg/gram vaginal cream  INSERT 1 GRAM VAGINALLY NIGHTLY AT BEDTIME 3 TIMES PER WEEK 42.5 g 3   Cyanocobalamin 1000 MCG SUBL cyanocobalamin (vit B-12) 1,000 mcg sublingual tablet     diclofenac sodium (VOLTAREN) 1 % GEL Apply 2-4 g to painful area of skin 4 times per day if tolerated. Apply only a maximum of 4 g per application 342 g 0   doxepin (SINEQUAN) 25 MG capsule      fluticasone (FLONASE) 50  MCG/ACT nasal spray Place 2 sprays into both nostrils daily. 9.9 mL 0   levocetirizine (XYZAL) 5 MG tablet Take 5 mg by mouth every evening.     levothyroxine (SYNTHROID) 150 MCG tablet Take 150 mcg by mouth daily before breakfast.     lidocaine (XYLOCAINE) 5 % ointment lidocaine 5 % topical ointment  Apply 1-2 grams TO affected AREA 3 TO 4 TIMES daily AS directed.     MYRBETRIQ 50 MG TB24 tablet TAKE 1 TABLET BY MOUTH DAILY 90 tablet 3   omeprazole (PRILOSEC) 40 MG capsule omeprazole 40 mg capsule,delayed release  TAKE ONE CAPSULE BY MOUTH EVERY DAY FOR REFLUX     oxyCODONE-acetaminophen (PERCOCET) 10-325 MG tablet Percocet 10 mg-325 mg tablet  Take 1 tablet every 6 hours by oral route.     No current facility-administered medications for this visit.    Patient confirms/reports the following allergies:  Allergies  Allergen Reactions   Celecoxib Other (See Comments)   Darvon [Propoxyphene] Hypertension    Other reaction(s): Unknown Heart race   Gabapentin Rash    No orders of the defined types were placed in this encounter.   AUTHORIZATION INFORMATION Primary Insurance: 1D#: Group #:  Secondary Insurance: 1D#: Group #:  SCHEDULE INFORMATION: Date: 12/06/22 Time: Location: [facility]

## 2022-11-11 ENCOUNTER — Encounter: Payer: Self-pay | Admitting: Gastroenterology

## 2022-12-06 ENCOUNTER — Ambulatory Visit: Admission: RE | Admit: 2022-12-06 | Payer: Medicaid Other | Source: Home / Self Care | Admitting: Gastroenterology

## 2022-12-06 ENCOUNTER — Encounter: Admission: RE | Payer: Self-pay | Source: Home / Self Care

## 2022-12-06 SURGERY — COLONOSCOPY WITH PROPOFOL
Anesthesia: General

## 2022-12-21 ENCOUNTER — Ambulatory Visit
Admission: RE | Admit: 2022-12-21 | Discharge: 2022-12-21 | Disposition: A | Discharge status: Home / Self Care | Payer: Medicaid Other | Source: Ambulatory Visit | Attending: Family Medicine | Admitting: Family Medicine

## 2022-12-21 DIAGNOSIS — Z1231 Encounter for screening mammogram for malignant neoplasm of breast: Secondary | ICD-10-CM | POA: Insufficient documentation

## 2022-12-31 ENCOUNTER — Other Ambulatory Visit: Payer: Self-pay | Admitting: Family Medicine

## 2022-12-31 DIAGNOSIS — N6489 Other specified disorders of breast: Secondary | ICD-10-CM

## 2022-12-31 DIAGNOSIS — R928 Other abnormal and inconclusive findings on diagnostic imaging of breast: Secondary | ICD-10-CM

## 2023-01-05 ENCOUNTER — Ambulatory Visit
Admission: RE | Admit: 2023-01-05 | Discharge: 2023-01-05 | Disposition: A | Discharge status: Home / Self Care | Payer: Medicaid Other | Source: Ambulatory Visit | Attending: Family Medicine | Admitting: Family Medicine

## 2023-01-05 ENCOUNTER — Encounter: Payer: Self-pay | Admitting: Family Medicine

## 2023-01-05 DIAGNOSIS — R928 Other abnormal and inconclusive findings on diagnostic imaging of breast: Secondary | ICD-10-CM | POA: Diagnosis present

## 2023-01-05 DIAGNOSIS — N6489 Other specified disorders of breast: Secondary | ICD-10-CM | POA: Diagnosis present

## 2023-01-10 ENCOUNTER — Other Ambulatory Visit: Payer: Self-pay | Admitting: Family Medicine

## 2023-01-10 DIAGNOSIS — R928 Other abnormal and inconclusive findings on diagnostic imaging of breast: Secondary | ICD-10-CM

## 2023-01-10 DIAGNOSIS — N6489 Other specified disorders of breast: Secondary | ICD-10-CM

## 2023-01-12 ENCOUNTER — Ambulatory Visit
Admission: RE | Admit: 2023-01-12 | Discharge: 2023-01-12 | Disposition: A | Discharge status: Home / Self Care | Payer: Medicaid Other | Source: Ambulatory Visit | Attending: Family Medicine | Admitting: Family Medicine

## 2023-01-12 DIAGNOSIS — N6489 Other specified disorders of breast: Secondary | ICD-10-CM | POA: Diagnosis present

## 2023-01-12 DIAGNOSIS — R928 Other abnormal and inconclusive findings on diagnostic imaging of breast: Secondary | ICD-10-CM | POA: Diagnosis present

## 2023-01-12 HISTORY — PX: BREAST BIOPSY: SHX20

## 2023-01-12 MED ORDER — LIDOCAINE HCL (PF) 1 % IJ SOLN
10.0000 mL | Freq: Once | INTRAMUSCULAR | Status: AC
  Administered 2023-01-12: 10 mL
  Filled 2023-01-12: qty 10

## 2023-01-13 LAB — SURGICAL PATHOLOGY

## 2023-07-06 ENCOUNTER — Encounter: Payer: Self-pay | Admitting: Family Medicine

## 2023-07-08 ENCOUNTER — Other Ambulatory Visit: Payer: Self-pay | Admitting: Family Medicine

## 2023-07-08 DIAGNOSIS — N6489 Other specified disorders of breast: Secondary | ICD-10-CM

## 2023-07-22 ENCOUNTER — Other Ambulatory Visit: Payer: Medicaid Other

## 2023-08-09 ENCOUNTER — Ambulatory Visit
Admission: RE | Admit: 2023-08-09 | Discharge: 2023-08-09 | Disposition: A | Discharge status: Home / Self Care | Payer: Medicaid Other | Source: Ambulatory Visit | Attending: Family Medicine | Admitting: Family Medicine

## 2023-08-09 DIAGNOSIS — N6489 Other specified disorders of breast: Secondary | ICD-10-CM | POA: Insufficient documentation

## 2023-08-16 ENCOUNTER — Telehealth: Payer: Self-pay

## 2023-08-16 ENCOUNTER — Other Ambulatory Visit: Payer: Self-pay

## 2023-08-16 DIAGNOSIS — R195 Other fecal abnormalities: Secondary | ICD-10-CM

## 2023-08-16 DIAGNOSIS — Z1211 Encounter for screening for malignant neoplasm of colon: Secondary | ICD-10-CM

## 2023-08-16 MED ORDER — SUTAB 1479-225-188 MG PO TABS: 12.0000 | ORAL_TABLET | ORAL | 0 refills | Status: AC

## 2023-08-16 MED ORDER — NA SULFATE-K SULFATE-MG SULF 17.5-3.13-1.6 GM/177ML PO SOLN: 1.0000 | Freq: Once | ORAL | 0 refills | Status: AC

## 2023-08-16 MED ORDER — ONDANSETRON HCL 4 MG PO TABS: 4.0000 mg | ORAL_TABLET | Freq: Four times a day (QID) | ORAL | 0 refills | Status: AC | PRN

## 2023-08-16 NOTE — Telephone Encounter (Signed)
Gastroenterology Pre-Procedure Review   Request Date: 09/14/23 Previously scheduled but was unable to tolerate prep.  Prep will be Sutab this go around and if its too costly will defer back to Suprep with rx for Zofran. [name] is too epensive.  Suprep has been sent to pharmacy with Zofran 4 mg advised to Take Zofran 30 mins before starting prep to alleviate nausea associated w/ prep process and q6 hr as needed. Qty 6.  Requesting Physician: Dr. Allegra Lai   PATIENT REVIEW QUESTIONS: The patient responded to the following health history questions as indicated:     1. Are you having any GI issues? Patient denies any GI issues.  Referral stated positive occult. No  previous colonoscopy 2. Do you have a personal history of Polyps? no 3. Do you have a family history of Colon Cancer or Polyps? no 4. Diabetes Mellitus? no 5. Joint replacements in the past 12 months?no 6. Major health problems in the past 3 months?no 7. Any artificial heart valves, MVP, or defibrillator?no    MEDICATIONS & ALLERGIES:    Patient reports the following regarding taking any anticoagulation/antiplatelet therapy:   Plavix, Coumadin, Eliquis, Xarelto, Lovenox, Pradaxa, Brilinta, or Effient? no Aspirin? no   Patient confirms/reports the following medications:        Current Outpatient Medications  Medication Sig Dispense Refill   Na Sulfate-K Sulfate-Mg Sulf 17.5-3.13-1.6 GM/177ML SOLN Take 1 kit by mouth once for 1 dose. 354 mL 0   amphetamine-dextroamphetamine (ADDERALL) 30 MG tablet Take 30 mg by mouth 3 (three) times daily.        Ascorbic Acid (VITAMIN C) 1000 MG tablet Take 1,000 mg by mouth daily.       atorvastatin (LIPITOR) 40 MG tablet Take 40 mg by mouth daily.       baclofen (LIORESAL) 10 MG tablet Take 10 mg by mouth 2 (two) times daily.       buPROPion (WELLBUTRIN) 75 MG tablet Take 75 mg by mouth 2 (two) times daily.       conjugated estrogens (PREMARIN) vaginal cream Premarin 0.625 mg/gram vaginal cream   INSERT 1 GRAM VAGINALLY NIGHTLY AT BEDTIME 3 TIMES PER WEEK 42.5 g 3   Cyanocobalamin 1000 MCG SUBL cyanocobalamin (vit B-12) 1,000 mcg sublingual tablet       diclofenac sodium (VOLTAREN) 1 % GEL Apply 2-4 g to painful area of skin 4 times per day if tolerated. Apply only a maximum of 4 g per application 500 g 0   doxepin (SINEQUAN) 25 MG capsule         fluticasone (FLONASE) 50 MCG/ACT nasal spray Place 2 sprays into both nostrils daily. 9.9 mL 0   levocetirizine (XYZAL) 5 MG tablet Take 5 mg by mouth every evening.       levothyroxine (SYNTHROID) 150 MCG tablet Take 150 mcg by mouth daily before breakfast.       lidocaine (XYLOCAINE) 5 % ointment lidocaine 5 % topical ointment  Apply 1-2 grams TO affected AREA 3 TO 4 TIMES daily AS directed.       MYRBETRIQ 50 MG TB24 tablet TAKE 1 TABLET BY MOUTH DAILY 90 tablet 3   omeprazole (PRILOSEC) 40 MG capsule omeprazole 40 mg capsule,delayed release  TAKE ONE CAPSULE BY MOUTH EVERY DAY FOR REFLUX       oxyCODONE-acetaminophen (PERCOCET) 10-325 MG tablet Percocet 10 mg-325 mg tablet  Take 1 tablet every 6 hours by oral route.          No current facility-administered medications  for this visit.        Patient confirms/reports the following allergies:  Allergies       Allergies  Allergen Reactions   Celecoxib Other (See Comments)   Darvon [Propoxyphene] Hypertension      Other reaction(s): Unknown Heart race   Gabapentin Rash        No orders of the defined types were placed in this encounter.     AUTHORIZATION INFORMATION Primary Insurance: 1D#: Group #:   Secondary Insurance: 1D#: Group #:   SCHEDULE INFORMATION: Date: 11/202/4 Time: Location: [facility]

## 2023-09-07 ENCOUNTER — Encounter: Payer: Self-pay | Admitting: Gastroenterology

## 2023-09-12 ENCOUNTER — Telehealth: Payer: Self-pay

## 2023-09-12 NOTE — Telephone Encounter (Signed)
Patient called in to reschedule her procedure with Dr. Allegra Lai on 09/14/23. She is available on 10/11/23, 10/12/23, 10/13/23, 10/14/23.

## 2023-09-13 ENCOUNTER — Telehealth: Payer: Self-pay

## 2023-09-13 NOTE — Telephone Encounter (Signed)
Phone call has been returned to patient to reschedule her colonoscopy from 11/20 with Dr. Allegra Lai.  Colonoscopy has been rescheduled for 10/11/23.  Instructions updated.  Referral updated.  Thanks,  [place], [place]

## 2023-10-10 ENCOUNTER — Telehealth: Payer: Self-pay

## 2023-10-10 NOTE — Telephone Encounter (Signed)
Pt requesting call back to reschedule colonoscopy

## 2023-10-10 NOTE — Telephone Encounter (Signed)
okay

## 2023-10-11 ENCOUNTER — Ambulatory Visit: Admission: RE | Admit: 2023-10-11 | Payer: Medicaid Other | Source: Home / Self Care | Admitting: Gastroenterology

## 2023-10-11 SURGERY — COLONOSCOPY WITH PROPOFOL
Anesthesia: General

## 2023-11-02 ENCOUNTER — Ambulatory Visit: Payer: Medicaid Other | Admitting: Podiatry

## 2023-11-16 ENCOUNTER — Ambulatory Visit: Payer: Medicaid Other | Admitting: Podiatry

## 2023-12-19 ENCOUNTER — Ambulatory Visit: Payer: Medicaid Other | Admitting: Podiatry

## 2023-12-19 ENCOUNTER — Encounter: Payer: Self-pay | Admitting: Podiatry

## 2023-12-19 ENCOUNTER — Ambulatory Visit (INDEPENDENT_AMBULATORY_CARE_PROVIDER_SITE_OTHER): Payer: Medicaid Other

## 2023-12-19 DIAGNOSIS — M7751 Other enthesopathy of right foot: Secondary | ICD-10-CM

## 2023-12-19 DIAGNOSIS — S9001XA Contusion of right ankle, initial encounter: Secondary | ICD-10-CM

## 2023-12-19 DIAGNOSIS — S93401A Sprain of unspecified ligament of right ankle, initial encounter: Secondary | ICD-10-CM

## 2023-12-19 DIAGNOSIS — M2042 Other hammer toe(s) (acquired), left foot: Secondary | ICD-10-CM

## 2023-12-19 DIAGNOSIS — T148XXA Other injury of unspecified body region, initial encounter: Secondary | ICD-10-CM

## 2023-12-19 NOTE — Progress Notes (Signed)
 She presents today after having not seen her for quite some time acute concern of lateral ankle pain right.  She states that she twisted the ankle a couple weeks ago is very tender and swollen initially could not bear weight on it but is starting to do a little better now though she started to develop more spasms and it.  She takes baclofen for the spasms.  She is also complaining of pain to the left second metatarsophalangeal joint.  This toe is still bothersome she states that I am still having pain in it every day for the past 2 years I was denied MRI and look at the toenail.  She states that since applier and the pain underneath the metatarsophalangeal joint is much worse that it was.  Objective: Vital signs are stable oriented x 3.  Pulses are palpable.  Neurologic system is intact Deetjen reflexes are intact muscle strength is normal and symmetrical.  She does have pain on palpation not of the anterior talofibular ligament or even the calcaneofibular ligament but the posterior talofibular ligament as well as the peroneus longus tendon is moderately tender with plantarflexion and eversion.  She also has on the contralateral foot rigid hammertoe deformity medial deviation of the second toe metatarsal phalangeal joint subluxation and pain on palpation to the area.  Assessment: She is going to continue her stiff soled shoes deep toe boxes ice therapy topical anti-inflammatory therapy oral anti-inflammatory therapy for the second toe and metatarsal phalangeal joint which is painful.  She will continue to do so and I will follow-up with her in 3 months.  She has sprained ankle with peroneus longus tendinitis right.  Plan: Discussed etiology pathology and surgical therapies recommended that she follow-up with me in 3 3 months for reevaluation of the toe and ankle.

## 2024-02-07 ENCOUNTER — Other Ambulatory Visit: Payer: Self-pay | Admitting: Family Medicine

## 2024-02-07 DIAGNOSIS — Z1231 Encounter for screening mammogram for malignant neoplasm of breast: Secondary | ICD-10-CM

## 2024-02-21 ENCOUNTER — Ambulatory Visit
Admission: RE | Admit: 2024-02-21 | Discharge: 2024-02-21 | Disposition: A | Discharge status: Home / Self Care | Source: Ambulatory Visit | Attending: Family Medicine | Admitting: Family Medicine

## 2024-02-21 DIAGNOSIS — Z1231 Encounter for screening mammogram for malignant neoplasm of breast: Secondary | ICD-10-CM | POA: Diagnosis present

## 2024-03-13 ENCOUNTER — Other Ambulatory Visit: Payer: Self-pay | Admitting: Primary Care

## 2024-03-13 DIAGNOSIS — R1013 Epigastric pain: Secondary | ICD-10-CM

## 2024-03-13 DIAGNOSIS — R1011 Right upper quadrant pain: Secondary | ICD-10-CM

## 2024-03-21 ENCOUNTER — Encounter: Payer: Self-pay | Admitting: Podiatry

## 2024-03-21 ENCOUNTER — Ambulatory Visit: Payer: Medicaid Other | Admitting: Podiatry

## 2024-03-21 DIAGNOSIS — M7671 Peroneal tendinitis, right leg: Secondary | ICD-10-CM

## 2024-03-21 DIAGNOSIS — M7752 Other enthesopathy of left foot: Secondary | ICD-10-CM

## 2024-03-21 MED ORDER — TRIAMCINOLONE ACETONIDE 40 MG/ML IJ SUSP
40.0000 mg | Freq: Once | INTRAMUSCULAR | Status: AC
  Administered 2024-03-21: 40 mg

## 2024-03-21 NOTE — Progress Notes (Signed)
 She presents today for follow-up of her peroneal tendinitis of her right foot and worsening of the pain beneath the second metatarsal phalangeal joint of the left foot.  She states that she has been exercising more to lose weight she is taking weight loss medication and she needs to be able to be on her feet that help with the weight loss.  She states that the peroneal tendons on the right are still in a lot of pain still hurting she is also complaining of pain around the second metatarsal phalangeal joint.  Objective: Vitals were stable alert oriented x 3.  Pulses are palpable.  There is no erythema edema salines drainage or odor she does have fluctuance on palpation of the peroneal tendons beneath the lateral malleolus.  She does have tenderness on end range of motion of the second metatarsophalangeal joint of the left foot.  Assessment: Probable peroneal tendinosis with tear of the peroneal tendons most likely a probable tear of the plantar plate of the forefoot left.  Plan: At this point today I am going to inject these areas today with 10 mg Kenalog  5 mg Marcaine  to the point of maximal tenderness.  If this fails to alleviate her symptoms we will consider MRI.

## 2024-04-03 ENCOUNTER — Ambulatory Visit
Admission: RE | Admit: 2024-04-03 | Discharge: 2024-04-03 | Disposition: A | Discharge status: Home / Self Care | Source: Ambulatory Visit | Attending: Primary Care | Admitting: Primary Care

## 2024-04-03 DIAGNOSIS — R1011 Right upper quadrant pain: Secondary | ICD-10-CM | POA: Insufficient documentation

## 2024-04-03 DIAGNOSIS — R1013 Epigastric pain: Secondary | ICD-10-CM | POA: Insufficient documentation

## 2024-05-09 ENCOUNTER — Ambulatory Visit: Admitting: Podiatry

## 2024-05-09 DIAGNOSIS — M7671 Peroneal tendinitis, right leg: Secondary | ICD-10-CM

## 2024-05-09 NOTE — Progress Notes (Signed)
 [name] presents today for follow-up of her peroneal tendinitis right foot.  States that it seems to be doing much better at this point probably about 40 to 50% improved.  Objective: Much decrease in symptomatology perineal tendons right.  Some fluctuance around the peroneal retinaculum posterior inferior lateral malleolus.  Assessment: Resolving peroneal tendinitis right.  Plan: Recommended another injection which we performed today 5 mg of Kenalog  and local anesthetic making sure not to inject into the tendon itself.  I will follow-up with her in 8 weeks call sooner if needed

## 2024-05-10 DIAGNOSIS — M7671 Peroneal tendinitis, right leg: Secondary | ICD-10-CM | POA: Diagnosis not present

## 2024-05-10 MED ORDER — TRIAMCINOLONE ACETONIDE 40 MG/ML IJ SUSP
20.0000 mg | Freq: Once | INTRAMUSCULAR | Status: AC
  Administered 2024-05-10: 20 mg

## 2024-05-10 NOTE — Addendum Note (Signed)
 Addended by: ELAYNE ROSINA BRAVO on: 05/10/2024 07:56 PM   Modules accepted: Orders

## 2024-07-04 ENCOUNTER — Ambulatory Visit: Admitting: Podiatry

## 2024-07-04 DIAGNOSIS — M7752 Other enthesopathy of left foot: Secondary | ICD-10-CM | POA: Diagnosis not present

## 2024-07-04 DIAGNOSIS — G5792 Unspecified mononeuropathy of left lower limb: Secondary | ICD-10-CM

## 2024-07-04 MED ORDER — TRIAMCINOLONE ACETONIDE 40 MG/ML IJ SUSP
20.0000 mg | Freq: Once | INTRAMUSCULAR | Status: AC
  Administered 2024-07-04: 20 mg

## 2024-07-04 MED ORDER — TRIAMCINOLONE ACETONIDE 0.1 % EX CREA: 1.0000 | TOPICAL_CREAM | Freq: Two times a day (BID) | CUTANEOUS | 0 refills | Status: AC

## 2024-07-04 NOTE — Progress Notes (Signed)
 She presents today for follow-up of her right ankle pain which she states is much improved however now she is experiencing pain to the first intermetatarsal space in the medial aspect of the first metatarsal phalangeal joint.  Objective: No reproducible pain on palpation of the right ankle or the peroneal tendons.  She does however have pain in the middle of the first intermetatarsal space and pain to the medial aspect of the first metatarsophalangeal joint and just proximal to that area.  She has some tenderness along the tibial sesamoid suspensory ligament area.  Assessment neuritis tendinitis first metatarsal space left.  Resolving peroneal tendinitis right.  Plan: Discussed etiology pathology conservative surgical therapies at this point I injected the first intermetatarsal space today after sterile Betadine skin prep with 10 mg Kenalog  and 5 mg of local anesthetic.  We discussed surgical intervention for her hallux valgus deformity.  Follow-up with her as needed.

## 2024-10-29 DIAGNOSIS — J209 Acute bronchitis, unspecified: Secondary | ICD-10-CM

## 2024-10-30 ENCOUNTER — Ambulatory Visit
Admission: RE | Admit: 2024-10-30 | Discharge: 2024-10-30 | Disposition: A | Discharge status: Home / Self Care | Attending: Primary Care | Admitting: Primary Care

## 2024-10-30 ENCOUNTER — Ambulatory Visit
Admission: RE | Admit: 2024-10-30 | Discharge: 2024-10-30 | Disposition: A | Discharge status: Home / Self Care | Source: Ambulatory Visit | Attending: Primary Care | Admitting: Primary Care

## 2024-10-30 DIAGNOSIS — J209 Acute bronchitis, unspecified: Secondary | ICD-10-CM | POA: Diagnosis present

## 2024-12-05 ENCOUNTER — Ambulatory Visit: Admitting: Podiatry
